# Patient Record
Sex: Female | Born: 1975 | Race: White | Hispanic: No | Smoking: Never smoker
Health system: Southern US, Community
[De-identification: ages and names within clinical notes are randomized; demographics above are authoritative.]

## PROBLEM LIST (undated history)

## (undated) ENCOUNTER — Ambulatory Visit: Admission: EM | Payer: 59

## (undated) DIAGNOSIS — E669 Obesity, unspecified: Secondary | ICD-10-CM

## (undated) DIAGNOSIS — R42 Dizziness and giddiness: Secondary | ICD-10-CM

## (undated) DIAGNOSIS — J40 Bronchitis, not specified as acute or chronic: Secondary | ICD-10-CM

## (undated) DIAGNOSIS — G4700 Insomnia, unspecified: Secondary | ICD-10-CM

## (undated) DIAGNOSIS — M545 Low back pain, unspecified: Secondary | ICD-10-CM

## (undated) DIAGNOSIS — L989 Disorder of the skin and subcutaneous tissue, unspecified: Secondary | ICD-10-CM

## (undated) DIAGNOSIS — Z634 Disappearance and death of family member: Secondary | ICD-10-CM

## (undated) DIAGNOSIS — R197 Diarrhea, unspecified: Secondary | ICD-10-CM

## (undated) DIAGNOSIS — K5712 Diverticulitis of small intestine without perforation or abscess without bleeding: Secondary | ICD-10-CM

## (undated) DIAGNOSIS — M25561 Pain in right knee: Secondary | ICD-10-CM

## (undated) DIAGNOSIS — J329 Chronic sinusitis, unspecified: Secondary | ICD-10-CM

## (undated) DIAGNOSIS — K5900 Constipation, unspecified: Secondary | ICD-10-CM

## (undated) DIAGNOSIS — K5792 Diverticulitis of intestine, part unspecified, without perforation or abscess without bleeding: Secondary | ICD-10-CM

## (undated) DIAGNOSIS — J0110 Acute frontal sinusitis, unspecified: Secondary | ICD-10-CM

## (undated) DIAGNOSIS — J189 Pneumonia, unspecified organism: Secondary | ICD-10-CM

## (undated) DIAGNOSIS — R51 Headache: Secondary | ICD-10-CM

## (undated) HISTORY — PX: MOUTH SURGERY: SHX715

## (undated) HISTORY — DX: Insomnia, unspecified: G47.00

## (undated) HISTORY — DX: Obesity, unspecified: E66.9

## (undated) HISTORY — DX: Constipation, unspecified: K59.00

## (undated) HISTORY — DX: Acute frontal sinusitis, unspecified: J01.10

## (undated) HISTORY — DX: Dizziness and giddiness: R42

## (undated) HISTORY — DX: Low back pain, unspecified: M54.50

## (undated) HISTORY — DX: Disappearance and death of family member: Z63.4

## (undated) HISTORY — DX: Diverticulitis of small intestine without perforation or abscess without bleeding: K57.12

## (undated) HISTORY — DX: Diarrhea, unspecified: R19.7

## (undated) HISTORY — DX: Bronchitis, not specified as acute or chronic: J40

## (undated) HISTORY — DX: Pain in right knee: M25.561

---
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
## 2006-08-05 ENCOUNTER — Emergency Department (HOSPITAL_COMMUNITY): Admission: EM | Admit: 2006-08-05 | Discharge: 2006-08-05 | Payer: Self-pay | Admitting: Emergency Medicine

## 2008-06-22 ENCOUNTER — Emergency Department (HOSPITAL_COMMUNITY): Admission: EM | Admit: 2008-06-22 | Discharge: 2008-06-22 | Payer: Self-pay | Admitting: Emergency Medicine

## 2012-05-15 ENCOUNTER — Emergency Department (HOSPITAL_COMMUNITY)
Admission: EM | Admit: 2012-05-15 | Discharge: 2012-05-15 | Disposition: A | Discharge status: Home / Self Care | Payer: BC Managed Care – PPO | Attending: Emergency Medicine | Admitting: Emergency Medicine

## 2012-05-15 ENCOUNTER — Other Ambulatory Visit (HOSPITAL_COMMUNITY): Payer: Self-pay | Admitting: *Deleted

## 2012-05-15 ENCOUNTER — Encounter (HOSPITAL_COMMUNITY): Payer: Self-pay | Admitting: Emergency Medicine

## 2012-05-15 ENCOUNTER — Ambulatory Visit (HOSPITAL_COMMUNITY): Admission: RE | Admit: 2012-05-15 | Payer: BC Managed Care – PPO | Source: Ambulatory Visit

## 2012-05-15 ENCOUNTER — Ambulatory Visit (HOSPITAL_COMMUNITY)
Admission: RE | Admit: 2012-05-15 | Discharge: 2012-05-15 | Disposition: A | Discharge status: Home / Self Care | Payer: BC Managed Care – PPO | Source: Ambulatory Visit | Attending: *Deleted | Admitting: *Deleted

## 2012-05-15 DIAGNOSIS — R109 Unspecified abdominal pain: Secondary | ICD-10-CM

## 2012-05-15 DIAGNOSIS — K5792 Diverticulitis of intestine, part unspecified, without perforation or abscess without bleeding: Secondary | ICD-10-CM

## 2012-05-15 DIAGNOSIS — K5732 Diverticulitis of large intestine without perforation or abscess without bleeding: Secondary | ICD-10-CM | POA: Insufficient documentation

## 2012-05-15 LAB — CBC WITH DIFFERENTIAL/PLATELET
Basophils Absolute: 0 10*3/uL (ref 0.0–0.1)
Basophils Relative: 0 % (ref 0–1)
Eosinophils Absolute: 0.2 10*3/uL (ref 0.0–0.7)
Eosinophils Relative: 1 % (ref 0–5)
HCT: 42.8 % (ref 36.0–46.0)
Hemoglobin: 14.8 g/dL (ref 12.0–15.0)
Lymphocytes Relative: 18 % (ref 12–46)
Lymphs Abs: 2.8 10*3/uL (ref 0.7–4.0)
MCH: 30.3 pg (ref 26.0–34.0)
MCHC: 34.6 g/dL (ref 30.0–36.0)
MCV: 87.5 fL (ref 78.0–100.0)
Monocytes Absolute: 1.6 10*3/uL — ABNORMAL HIGH (ref 0.1–1.0)
Monocytes Relative: 10 % (ref 3–12)
Neutro Abs: 10.8 10*3/uL — ABNORMAL HIGH (ref 1.7–7.7)
Neutrophils Relative %: 71 % (ref 43–77)
Platelets: 338 10*3/uL (ref 150–400)
RBC: 4.89 MIL/uL (ref 3.87–5.11)
RDW: 13 % (ref 11.5–15.5)
WBC: 15.3 10*3/uL — ABNORMAL HIGH (ref 4.0–10.5)

## 2012-05-15 LAB — BASIC METABOLIC PANEL
BUN: 9 mg/dL (ref 6–23)
CO2: 24 mEq/L (ref 19–32)
Calcium: 9.4 mg/dL (ref 8.4–10.5)
Chloride: 94 mEq/L — ABNORMAL LOW (ref 96–112)
Creatinine, Ser: 0.61 mg/dL (ref 0.50–1.10)
GFR calc Af Amer: >90 mL/min (ref >90)
GFR calc non Af Amer: >90 mL/min (ref >90)
Glucose, Bld: 86 mg/dL (ref 70–99)
Potassium: 3.5 mEq/L (ref 3.5–5.1)
Sodium: 134 mEq/L — ABNORMAL LOW (ref 135–145)

## 2012-05-15 MED ORDER — ONDANSETRON HCL 4 MG/2ML IJ SOLN
4.0000 mg | Freq: Once | INTRAMUSCULAR | Status: AC
  Administered 2012-05-15: 4 mg via INTRAVENOUS
  Filled 2012-05-15: qty 2

## 2012-05-15 MED ORDER — ONDANSETRON 8 MG PO TBDP: 8.0000 mg | ORAL_TABLET | Freq: Three times a day (TID) | ORAL | Status: DC | PRN

## 2012-05-15 MED ORDER — METRONIDAZOLE 500 MG PO TABS: 500.0000 mg | ORAL_TABLET | Freq: Four times a day (QID) | ORAL | Status: DC

## 2012-05-15 MED ORDER — OXYCODONE-ACETAMINOPHEN 5-325 MG PO TABS: 1.0000 | ORAL_TABLET | Freq: Four times a day (QID) | ORAL | Status: DC | PRN

## 2012-05-15 MED ORDER — SODIUM CHLORIDE 0.9 % IV BOLUS (SEPSIS)
500.0000 mL | Freq: Once | INTRAVENOUS | Status: AC
  Administered 2012-05-15: 500 mL via INTRAVENOUS

## 2012-05-15 MED ORDER — IOHEXOL 300 MG/ML  SOLN
100.0000 mL | Freq: Once | INTRAMUSCULAR | Status: AC | PRN
  Administered 2012-05-15: 100 mL via INTRAVENOUS

## 2012-05-15 MED ORDER — CIPROFLOXACIN IN D5W 400 MG/200ML IV SOLN
400.0000 mg | Freq: Once | INTRAVENOUS | Status: AC
  Administered 2012-05-15: 400 mg via INTRAVENOUS
  Filled 2012-05-15: qty 200

## 2012-05-15 MED ORDER — CIPROFLOXACIN HCL 750 MG PO TABS: 750.0000 mg | ORAL_TABLET | Freq: Two times a day (BID) | ORAL | Status: DC

## 2012-05-15 MED ORDER — METRONIDAZOLE IN NACL 5-0.79 MG/ML-% IV SOLN
500.0000 mg | Freq: Once | INTRAVENOUS | Status: AC
  Administered 2012-05-15: 500 mg via INTRAVENOUS
  Filled 2012-05-15: qty 100

## 2012-05-15 NOTE — ED Provider Notes (Signed)
History     CSN: [id number]  Arrival date & time 05/15/12  1907   First MD Initiated Contact with Patient 05/15/12 1931      Chief Complaint  Patient presents with  . Abdominal Pain    (Consider location/radiation/quality/duration/timing/severity/associated sxs/prior treatment) Patient is a 36 y.o. female presenting with abdominal pain. The history is provided by the patient.  Abdominal Pain The primary symptoms of the illness include abdominal pain, fever and nausea. The primary symptoms of the illness do not include shortness of breath, vomiting or diarrhea.  Symptoms associated with the illness do not include back pain.   patient presents with abdominal pain for the last few days. It began on Thursday. She took mag citrate. She went to [facility] urgent care and had an outpatient CT that showed diverticulitis with microperforation. Patient has had some fevers yesterday. She's had nauseousness without emesis but has been able to tolerate orals overall. She's had several stools after the magnesium citrate.  History reviewed. No pertinent past medical history.  Past Surgical History  Procedure Date  . Mouth surgery     No family history on file.  History  Substance Use Topics  . Smoking status: Never Smoker   . Smokeless tobacco: Never Used  . Alcohol Use: Yes     rarely    OB History    Grav Para Term Preterm Abortions TAB SAB Ect Mult Living                  Review of Systems  Constitutional: Positive for fever. Negative for activity change and appetite change.  HENT: Negative for neck stiffness.   Eyes: Negative for pain.  Respiratory: Negative for chest tightness and shortness of breath.   Cardiovascular: Negative for chest pain and leg swelling.  Gastrointestinal: Positive for nausea and abdominal pain. Negative for vomiting and diarrhea.  Genitourinary: Negative for flank pain.  Musculoskeletal: Negative for back pain.  Skin: Negative for rash.    Neurological: Negative for weakness, numbness and headaches.  Psychiatric/Behavioral: Negative for behavioral problems.    Allergies  Codeine and Penicillins  Home Medications   Current Outpatient Rx  Name Route Sig Dispense Refill  . HYDROCODONE-ACETAMINOPHEN 5-500 MG PO TABS Oral Take 1 tablet by mouth every 6 (six) hours as needed. pain    . CIPROFLOXACIN HCL 750 MG PO TABS Oral Take 1 tablet (750 mg total) by mouth 2 (two) times daily. 28 tablet 0  . METRONIDAZOLE 500 MG PO TABS Oral Take 1 tablet (500 mg total) by mouth 4 (four) times daily. 14 tablet 56  . ONDANSETRON 8 MG PO TBDP Oral Take 1 tablet (8 mg total) by mouth every 8 (eight) hours as needed for nausea. 20 tablet 0  . OXYCODONE-ACETAMINOPHEN 5-325 MG PO TABS Oral Take 1-2 tablets by mouth every 6 (six) hours as needed for pain. 20 tablet 0    BP 111/67  Pulse 95  Temp 98.4 F (36.9 C) (Oral)  Resp 18  SpO2 99%  LMP 05/07/2012  Physical Exam  Nursing note and vitals reviewed. Constitutional: She is oriented to person, place, and time. She appears well-developed and well-nourished.  HENT:  Head: Normocephalic and atraumatic.  Eyes: EOM are normal. Pupils are equal, round, and reactive to light.  Neck: Normal range of motion. Neck supple.  Cardiovascular: Normal rate, regular rhythm and normal heart sounds.   No murmur heard. Pulmonary/Chest: Effort normal and breath sounds normal. No respiratory distress. She has no wheezes. She  has no rales.  Abdominal: Soft. Bowel sounds are normal. She exhibits no distension. There is tenderness. There is no rebound and no guarding.       Left lower quadrant tenderness without rebound or guarding.  Musculoskeletal: Normal range of motion.  Neurological: She is alert and oriented to person, place, and time. No cranial nerve deficit.  Skin: Skin is warm and dry.  Psychiatric: She has a normal mood and affect. Her speech is normal.    ED Course  Procedures (including  critical care time)  Labs Reviewed  CBC WITH DIFFERENTIAL - Abnormal; Notable for the following:    WBC 15.3 (*)     Neutro Abs 10.8 (*)     Monocytes Absolute 1.6 (*)     All other components within normal limits  BASIC METABOLIC PANEL - Abnormal; Notable for the following:    Sodium 134 (*)     Chloride 94 (*)     All other components within normal limits  LAB REPORT - SCANNED   Ct Abdomen Pelvis W Contrast  05/15/2012  *RADIOLOGY REPORT*  Clinical Data: LLQ pain, leukocytosis, evaluate for diverticulitis  CT ABDOMEN AND PELVIS WITH CONTRAST  Technique:  Multidetector CT imaging of the abdomen and pelvis was performed following the standard protocol during bolus administration of intravenous contrast.  Contrast: OMNIPAQUE IOHEXOL 300 MG/ML  SOLN  Comparison: None.  Findings: Lung bases are clear.  Liver, spleen, pancreas, and adrenal glands are within normal limits.  Gallbladder is unremarkable.  No intrahepatic or extrahepatic ductal dilatation.  Kidneys are within normal limits.  No hydronephrosis.  No evidence of bowel obstruction.  Normal appendix.  Colonic diverticulosis with associated inflammatory changes involving the sigmoid colon, compatible with diverticulitis. Tiny focus of pericolonic gas is favored to reflect localized microperforation (coronal image 54).  No drainable fluid collection or abscess.  No free air.  Secondary inflammatory changes involving the adjacent left ovary are suspected (series 2/image 73).  Underlying 1.8 cm left ovarian cyst / follicle (series 2/image 74).  Uterus and right ovary are unremarkable.  No evidence of abdominal aortic aneurysm.  No abdominopelvic ascites.  Small retroperitoneal lymph nodes which do not meet pathologic CT size criteria.  Bladder is within normal limits.  Visualized osseous structures are within normal limits.  IMPRESSION: Sigmoid diverticulitis with suspected localized microperforation.  Suspected secondary inflammatory changes  involving the adjacent left ovary.  No drainable fluid collection or abscess.  No free air.   Original Report Authenticated By: Charline Bills, M.D.      1. Diverticulitis       MDM  Patient was sent in with a CT showing sigmoid diverticulitis with local microperforation. Patient feels good and has been tolerating orals. White count shows mildly elevation. Patient is given IV doses of Cipro and Flagyl. This point it is worth a trial at home. She'll followup in one to 2 days with her primary care Dr. Geronimo Running given instructions to return for worsening fevers abdominal pain or inability to tolerate orals.        Juliet Rude. Rubin Payor, MD 05/17/12 [id number]

## 2012-05-15 NOTE — ED Notes (Signed)
Writer told pt she needs a U/A, pt is aware of the urine sample.

## 2012-05-15 NOTE — ED Notes (Signed)
Pt presents from CT scan after having abdominal CT which shows "microperforation".  Pt developed abdominal pain on Thursday, took 1/2 bottle mag citrate on physician's advance, continued to have pain yesterday and completed mag citrate. Continues to have abd pain, nausea w/o emesis, multiple stools following mag. Pt alert, PWD, NAD

## 2012-05-15 NOTE — ED Notes (Signed)
[id number]<AV> Expected date:05/15/12<BR> Expected time: 7:04 PM<BR> Means of arrival:<BR> Comments:<BR> Abnormal Abd CT

## 2012-10-08 ENCOUNTER — Encounter (INDEPENDENT_AMBULATORY_CARE_PROVIDER_SITE_OTHER): Payer: Self-pay | Admitting: *Deleted

## 2012-10-13 ENCOUNTER — Other Ambulatory Visit (INDEPENDENT_AMBULATORY_CARE_PROVIDER_SITE_OTHER): Payer: Self-pay | Admitting: *Deleted

## 2012-10-13 ENCOUNTER — Ambulatory Visit (INDEPENDENT_AMBULATORY_CARE_PROVIDER_SITE_OTHER): Payer: Medicare HMO | Admitting: Internal Medicine

## 2012-10-13 ENCOUNTER — Encounter (INDEPENDENT_AMBULATORY_CARE_PROVIDER_SITE_OTHER): Payer: Self-pay | Admitting: Internal Medicine

## 2012-10-13 ENCOUNTER — Telehealth (INDEPENDENT_AMBULATORY_CARE_PROVIDER_SITE_OTHER): Payer: Self-pay | Admitting: *Deleted

## 2012-10-13 VITALS — BP 100/78 | HR 66 | Temp 98.5°F | Ht 66.0 in | Wt 266.3 lb

## 2012-10-13 DIAGNOSIS — K5732 Diverticulitis of large intestine without perforation or abscess without bleeding: Secondary | ICD-10-CM

## 2012-10-13 MED ORDER — PEG-KCL-NACL-NASULF-NA ASC-C 100 G PO SOLR: 1.0000 | Freq: Once | ORAL | Status: DC

## 2012-10-13 NOTE — Telephone Encounter (Signed)
Patient needs movi prep 

## 2012-10-13 NOTE — Progress Notes (Addendum)
Subjective:     Patient ID: Virginia Barrett, female   DOB: 1975-11-13, 37 y.o.   MRN: [id number]  HPI Referred to our office by Dr. Juanetta Barrett for  Recent diverticulitis. She was empirically treated with flagyl and bactrim. Pain started 3 weeks ago. Seen Dr. Juanetta Barrett and placed on Flagyl and Bactrim x 2 weeks. Finished antibiotics 1 week ago. She was having left lower quadrant pain and seemed to spread to center of lower abdomen.  She feels better. No abdominal pain. She has a BM once a day. No melena or bright red rectal bleeding. Appetite is good. Sometimes she has a bloating feeling. She is taking a probiotic daly.   04/2012: CT abdomen/pelvis with CM:IMPRESSION:  Sigmoid diverticulitis with suspected localized microperforation.  Suspected secondary inflammatory changes involving the adjacent  left ovary.  No drainable fluid collection or abscess. No free air.  CBC    Component Value Date/Time   WBC 15.3* 05/15/2012 1949   RBC 4.89 05/15/2012 1949   HGB 14.8 05/15/2012 1949   HCT 42.8 05/15/2012 1949   PLT 338 05/15/2012 1949   MCV 87.5 05/15/2012 1949   MCH 30.3 05/15/2012 1949   MCHC 34.6 05/15/2012 1949   RDW 13.0 05/15/2012 1949   LYMPHSABS 2.8 05/15/2012 1949   MONOABS 1.6* 05/15/2012 1949   EOSABS 0.2 05/15/2012 1949   BASOSABS 0.0 05/15/2012 1949      Review of Systems see hpi Current Outpatient Prescriptions  Medication Sig Dispense Refill  . BIOGAIA PROBIOTIC (BIOGAIA PROBIOTIC) LIQD Take by mouth daily at 8 pm.      . ciprofloxacin (CIPRO) 750 MG tablet Take 1 tablet (750 mg total) by mouth 2 (two) times daily.  28 tablet  0  . HYDROcodone-acetaminophen (VICODIN) 5-500 MG per tablet Take 1 tablet by mouth every 6 (six) hours as needed. pain      . meloxicam (MOBIC) 15 MG tablet Take 15 mg by mouth daily.      . metroNIDAZOLE (FLAGYL) 500 MG tablet Take 1 tablet (500 mg total) by mouth 4 (four) times daily.  14 tablet  56  . ondansetron (ZOFRAN-ODT) 8 MG  disintegrating tablet Take 1 tablet (8 mg total) by mouth every 8 (eight) hours as needed for nausea.  20 tablet  0  . tiZANidine (ZANAFLEX) 4 MG capsule Take 2 mg by mouth 3 (three) times daily.       No current facility-administered medications for this visit.   Ms. Past Surgical History  Procedure Laterality Date  . Mouth surgery    . Mouth surgery      wisdom teeth removed   Allergies  Allergen Reactions  . Ciprofloxacin     Rash  . Codeine Hives and Swelling  . Penicillins Hives       Objective:   Physical Exam  Filed Vitals:   10/13/12 1104  BP: 100/78  Pulse: 66  Temp: 98.5 F (36.9 C)  Height: 5\' 6"  (1.676 m)  Weight: 266 lb 4.8 oz (120.793 kg)   Alert and oriented. Skin warm and dry. Oral mucosa is moist.   . Sclera anicteric, conjunctivae is pink. Thyroid not enlarged. No cervical lymphadenopathy. Lungs clear. Heart regular rate and rhythm.  Abdomen is soft. Bowel sounds are positive. No hepatomegaly. No abdominal masses felt. No tenderness.  No edema to lower extremities.       Assessment:    Diverticulitis resolved.  Colonic neoplasm needs to be ruled out.  I discussed this case with Dr.  Rehman.     Plan:     Colonoscopy with Dr Virginia Barrett. All questions were answered.

## 2012-10-13 NOTE — Patient Instructions (Addendum)
Colonoscopy with Dr. Karilyn Cota. Any NSAIDs: Any Tattoos: The risks and benefits such as perforation, bleeding, and infection were reviewed with the patient and is agreeable.

## 2012-11-12 ENCOUNTER — Encounter (HOSPITAL_COMMUNITY): Payer: Self-pay | Admitting: Pharmacy Technician

## 2012-11-17 ENCOUNTER — Ambulatory Visit (HOSPITAL_COMMUNITY)
Admission: RE | Admit: 2012-11-17 | Discharge: 2012-11-17 | Disposition: A | Discharge status: Home / Self Care | Payer: Managed Care, Other (non HMO) | Source: Ambulatory Visit | Attending: Internal Medicine | Admitting: Internal Medicine

## 2012-11-17 ENCOUNTER — Encounter (HOSPITAL_COMMUNITY)
Admission: RE | Disposition: A | Discharge status: Home / Self Care | Payer: Self-pay | Source: Ambulatory Visit | Attending: Internal Medicine

## 2012-11-17 ENCOUNTER — Encounter (HOSPITAL_COMMUNITY): Payer: Self-pay | Admitting: *Deleted

## 2012-11-17 DIAGNOSIS — Z9109 Other allergy status, other than to drugs and biological substances: Secondary | ICD-10-CM | POA: Insufficient documentation

## 2012-11-17 DIAGNOSIS — Z8719 Personal history of other diseases of the digestive system: Secondary | ICD-10-CM

## 2012-11-17 DIAGNOSIS — K5732 Diverticulitis of large intestine without perforation or abscess without bleeding: Secondary | ICD-10-CM

## 2012-11-17 DIAGNOSIS — K5730 Diverticulosis of large intestine without perforation or abscess without bleeding: Secondary | ICD-10-CM

## 2012-11-17 DIAGNOSIS — D126 Benign neoplasm of colon, unspecified: Secondary | ICD-10-CM

## 2012-11-17 DIAGNOSIS — Z79899 Other long term (current) drug therapy: Secondary | ICD-10-CM | POA: Insufficient documentation

## 2012-11-17 DIAGNOSIS — Z880 Allergy status to penicillin: Secondary | ICD-10-CM | POA: Insufficient documentation

## 2012-11-17 DIAGNOSIS — Z83719 Family history of colon polyps, unspecified: Secondary | ICD-10-CM | POA: Insufficient documentation

## 2012-11-17 DIAGNOSIS — Z8371 Family history of colonic polyps: Secondary | ICD-10-CM | POA: Insufficient documentation

## 2012-11-17 DIAGNOSIS — Z883 Allergy status to other anti-infective agents status: Secondary | ICD-10-CM | POA: Insufficient documentation

## 2012-11-17 HISTORY — PX: COLONOSCOPY: SHX5424

## 2012-11-17 HISTORY — DX: Diverticulitis of intestine, part unspecified, without perforation or abscess without bleeding: K57.92

## 2012-11-17 SURGERY — COLONOSCOPY
Anesthesia: Moderate Sedation

## 2012-11-17 MED ORDER — MIDAZOLAM HCL 5 MG/5ML IJ SOLN
INTRAMUSCULAR | Status: DC | PRN
  Administered 2012-11-17 (×5): 2 mg via INTRAVENOUS

## 2012-11-17 MED ORDER — ONDANSETRON HCL 4 MG PO TABS: 4.0000 mg | ORAL_TABLET | Freq: Two times a day (BID) | ORAL | Status: DC | PRN

## 2012-11-17 MED ORDER — MEPERIDINE HCL 50 MG/ML IJ SOLN
INTRAMUSCULAR | Status: AC
  Filled 2012-11-17: qty 1

## 2012-11-17 MED ORDER — MEPERIDINE HCL 50 MG/ML IJ SOLN
INTRAMUSCULAR | Status: DC | PRN
  Administered 2012-11-17 (×2): 25 mg via INTRAVENOUS

## 2012-11-17 MED ORDER — MIDAZOLAM HCL 5 MG/5ML IJ SOLN
INTRAMUSCULAR | Status: AC
  Filled 2012-11-17: qty 10

## 2012-11-17 MED ORDER — SULFAMETHOXAZOLE-TMP DS 800-160 MG PO TABS: 1.0000 | ORAL_TABLET | Freq: Two times a day (BID) | ORAL | Status: DC

## 2012-11-17 MED ORDER — STERILE WATER FOR IRRIGATION IR SOLN
Status: DC | PRN
  Administered 2012-11-17: 10:00:00

## 2012-11-17 MED ORDER — SODIUM CHLORIDE 0.9 % IV SOLN
INTRAVENOUS | Status: DC
  Administered 2012-11-17: 10:00:00 via INTRAVENOUS

## 2012-11-17 NOTE — H&P (Signed)
Virginia Barrett is an 37 y.o. female.   Chief Complaint: Patient is here for colonoscopy. HPI: Patient is 37 year old Caucasian female who presents for diagnostic colonoscopy. She was treated for diverticulitis in October 2013 and she developed an episode recently responded to antibiotic therapy. She she denies abdominal pain diarrhea constipation or rectal bleeding. She has taken meloxicam sporadically but none in the last 6 months. Him history is negative for IBD or CRC.  Past Medical History  Diagnosis Date  . Diverticulitis     Past Surgical History  Procedure Laterality Date  . Mouth surgery      wisdom teeth removed    Family History  Problem Relation Age of Onset  . Colon polyps Mother   . Colon polyps Other   . Colon cancer Neg Hx    Social History:  reports that she has never smoked. She has never used smokeless tobacco. She reports that  drinks alcohol. She reports that she does not use illicit drugs.  Allergies:  Allergies  Allergen Reactions  . Ciprofloxacin     Rash  . Codeine Hives and Swelling  . Penicillins Hives  . Red Dye     Medications Prior to Admission  Medication Sig Dispense Refill  . bifidobacterium infantis (ALIGN) capsule Take 1 capsule by mouth daily.      [name] HYDROcodone-acetaminophen (VICODIN) 5-500 MG per tablet Take 1 tablet by mouth every 6 (six) hours as needed. pain      . peg 3350 powder (MOVIPREP) 100 G SOLR Take 1 kit (100 g total) by mouth once.  1 kit  0  . meloxicam (MOBIC) 15 MG tablet Take 15 mg by mouth daily as needed for pain.       [name] tiZANidine (ZANAFLEX) 4 MG tablet Take 2 mg by mouth daily as needed. Back flare-ups        No results found for this or any previous visit (from the past 48 hour(s)). No results found.  ROS  Blood pressure 123/76, pulse 109, temperature 98.1 F (36.7 C), temperature source Oral, resp. rate 25, height 5\' 6"  (1.676 m), weight 260 lb (117.935 kg), last menstrual period 11/05/2012, SpO2  100.00%. Physical Exam  Constitutional: She appears well-developed and well-nourished.  HENT:  Mouth/Throat: Oropharynx is clear and moist.  Eyes: Conjunctivae are normal. No scleral icterus.  Neck: No thyromegaly present.  Cardiovascular: Normal rate, regular rhythm and normal heart sounds.   No murmur heard. Respiratory: Effort normal and breath sounds normal.  GI: Soft. She exhibits no distension and no mass.  Musculoskeletal: She exhibits no edema.  Lymphadenopathy:    She has no cervical adenopathy.  Neurological: She is alert.  Skin: Skin is warm and dry.     Assessment/Plan History of sigmoid diverticulitis. Diagnostic colonoscopy.  REHMAN,NAJEEB U 11/17/2012, 10:27 AM

## 2012-11-17 NOTE — Op Note (Signed)
COLONOSCOPY PROCEDURE REPORT  PATIENT:  Virginia Barrett  MR#:  [id number] Birthdate:  [birth year], 37 y.o., female Endoscopist:  Dr. Malissa Hippo, MD Referred By:  Dr. Oneal Deputy. Juanetta Gosling, MD Procedure Date: 11/17/2012  Procedure:   Colonoscopy with snare polypectomy.  Indications:  Patient is 75 old Caucasian female who's been treated for diverticulitis on 2 occasions. She is now asymptomatic. She is undergoing diagnostic colonoscopy. Him history is negative for IBD or CRC.  Informed Consent:  The procedure and risks were reviewed with the patient and informed consent was obtained.  Medications:  Demerol 50 mg IV Versed 10 mg IV  Description of procedure:  After a digital rectal exam was performed, that colonoscope was advanced from the anus through the rectum and colon to the area of the cecum, ileocecal valve and appendiceal orifice. The cecum was deeply intubated. These structures were well-seen and photographed for the record. From the level of the cecum and ileocecal valve, the scope was slowly and cautiously withdrawn. The mucosal surfaces were carefully surveyed utilizing scope tip to flexion to facilitate fold flattening as needed. The scope was pulled down into the rectum where a thorough exam including retroflexion was performed. Terminal ileum was also examined.  Findings:   Prep excellent. Normal mucosa of terminal ileum. 7 mm polyp snared from ascending colon. It broke into 2 pieces during retrieval. Multiple diverticula at sigmoid colon with few more scattered proximally. One diverticulum at sigmoid colon with erosion and mucopurulent exudate. Normal rectal mucosa and anal rectal junction.    Therapeutic/Diagnostic Maneuvers Performed:  See above  Complications:  None  Cecal Withdrawal Time:  12 minutes  Impression:  Normal mucosa of terminal ileum. 7 mm polyp snared from ascending colon. Pancolonic diverticulosis. Most of the diverticula at at sigmoid colon and  one with changes of diverticulitis.  Recommendations:  Standard instructions given. Bactrim DS one by mouth twice a day for 2 weeks. Patient advised not to take meloxicam and use OTC Advil on when necessary basis. I will contact patient with biopsy results and further recommendations. Considering that she has single episode of complicated diverticulitis and she's had 2 more episodes(including today,s) she should consider elective sigmoid colectomy.  Tahjae Durr U  11/17/2012 11:11 AM  CC: Dr. Fredirick Maudlin, MD & Dr. Bonnetta Barry ref. provider found

## 2012-11-19 ENCOUNTER — Encounter (HOSPITAL_COMMUNITY): Payer: Self-pay | Admitting: Internal Medicine

## 2012-12-08 ENCOUNTER — Encounter (INDEPENDENT_AMBULATORY_CARE_PROVIDER_SITE_OTHER): Payer: Self-pay | Admitting: General Surgery

## 2012-12-08 ENCOUNTER — Ambulatory Visit (INDEPENDENT_AMBULATORY_CARE_PROVIDER_SITE_OTHER): Payer: Managed Care, Other (non HMO) | Admitting: General Surgery

## 2012-12-08 ENCOUNTER — Other Ambulatory Visit (INDEPENDENT_AMBULATORY_CARE_PROVIDER_SITE_OTHER): Payer: Self-pay | Admitting: General Surgery

## 2012-12-08 VITALS — BP 120/74 | HR 74 | Temp 97.0°F | Resp 16 | Ht 66.5 in | Wt 270.2 lb

## 2012-12-08 DIAGNOSIS — K5732 Diverticulitis of large intestine without perforation or abscess without bleeding: Secondary | ICD-10-CM

## 2012-12-08 NOTE — Progress Notes (Signed)
Patient ID: Virginia Barrett, female   DOB: 1976-03-12, 37 y.o.   MRN: [id number]  Chief Complaint  Patient presents with  . New Evaluation    eval diverticulitis    HPI Virginia Barrett is a 37 y.o. female.   HPI 37 yo WF referred by Dr Karilyn Cota to discuss colon surgery for recurrent diverticulitis. 37 year old otherwise healthy Caucasian female states she developed extreme left lower quadrant abdominal pain in October of 2013 on Wednesday afternoon. She initially thought that she was constipated. She felt that she was becoming dehydrated. She went to an urgent care clinic on Friday and was subsequently sent to [facility] where CT scan was performed which demonstrated sigmoid diverticulitis with a microperforation. They recommended admission to the hospital but the patient deferred admission and elected to be treated at home with oral antibiotics for one week. She states that her abdominal pain resolved. However in January she had a recurrent bout of left lower quadrant pain not as severe and was treated with another round of oral antibiotics as an outpatient by Dr. Juanetta Gosling. She was set up for a colonoscopy which she underwent in April. During the colonoscopy she reportedly was found to have some areas of active inflammation in her sigmoid colon and she was placed on antibiotics post procedure. She states that she has not had any abdominal pain of late. She reports daily bowel movements. She denies any melena or hematochezia. She denies any nausea or vomiting. She denies any family history of colon cancer. She denies any weight change. She denies any prior abdominal surgery. She states that she has had regular bowel movements her entire life. She denies any discharge from her vagina. She denies any dysuria or pneumaturia.  She works as a Social research officer, government at [employer]  Her mother has a history of bilateral PEs. Her mother who accompanies her today states that she had a full workup and there was  nothing found to explain her reasons for developing PEs such as a genetic disorder for thick blood Past Medical History  Diagnosis Date  . Diverticulitis     Past Surgical History  Procedure Laterality Date  . Mouth surgery      wisdom teeth removed  . Colonoscopy N/A 11/17/2012    Procedure: COLONOSCOPY;  Surgeon: Malissa Hippo, MD;  Location: [facility];  Service: Endoscopy;  Laterality: N/A;  1200-moved to1030 Ann to notify pt    Family History  Problem Relation Age of Onset  . Colon polyps Mother   . Diabetes Mother   . COPD Mother   . Colon polyps Other   . Colon cancer Neg Hx   . Hodgkin's lymphoma Father   . Cancer Maternal Aunt     breast  . Cancer Maternal Grandmother     breast    Social History History  Substance Use Topics  . Smoking status: Never Smoker   . Smokeless tobacco: Never Used  . Alcohol Use: Yes     Comment: rarely    Allergies  Allergen Reactions  . Cinnamon Anaphylaxis  . Sulfur Hives  . Ciprofloxacin     Rash  . Codeine Hives and Swelling  . Penicillins Hives  . Red Dye     Current Outpatient Prescriptions  Medication Sig Dispense Refill  . acetaminophen (TYLENOL) 500 MG tablet Take 500 mg by mouth every 6 (six) hours as needed for pain.      . bifidobacterium infantis (ALIGN) capsule Take 1 capsule  by mouth daily.       No current facility-administered medications for this visit.    Review of Systems Review of Systems  Constitutional: Negative for fever, chills and unexpected weight change.  HENT: Negative for hearing loss, congestion, sore throat, trouble swallowing and voice change.   Eyes: Negative for visual disturbance.  Respiratory: Negative for cough and wheezing.   Cardiovascular: Negative for chest pain, palpitations and leg swelling.  Gastrointestinal: Negative for nausea, vomiting, abdominal pain, diarrhea, constipation, blood in stool, abdominal distention and anal bleeding.  Genitourinary: Negative for  hematuria, vaginal bleeding and difficulty urinating.  Musculoskeletal: Negative for arthralgias.  Skin: Negative for rash and wound.       H/o abnormal moles  Neurological: Negative for seizures, syncope and headaches.  Hematological: Negative for adenopathy. Does not bruise/bleed easily.  Psychiatric/Behavioral: Negative for confusion.    Blood pressure 120/74, pulse 74, temperature 97 F (36.1 C), temperature source Temporal, resp. rate 16, height 5' 6.5" (1.689 m), weight 270 lb 3.2 oz (122.562 kg), SpO2 98.00%.  Physical Exam Physical Exam  Vitals reviewed. Constitutional: She is oriented to person, place, and time. She appears well-developed and well-nourished. No distress.  Obese, pear shaped  HENT:  Head: Normocephalic and atraumatic.  Right Ear: External ear normal.  Eyes: Conjunctivae are normal. No scleral icterus.  Neck: Neck supple. No tracheal deviation present. No thyromegaly present.  Cardiovascular: Normal rate, regular rhythm, normal heart sounds and intact distal pulses.   Pulmonary/Chest: Effort normal and breath sounds normal. No respiratory distress. She has no wheezes.  Abdominal: Soft. She exhibits no distension. There is no tenderness. There is no rebound and no guarding.  Old belly button ring scar  Musculoskeletal: She exhibits no edema and no tenderness.  Lymphadenopathy:    She has no cervical adenopathy.  Neurological: She is alert and oriented to person, place, and time.  Skin: Skin is warm and dry. No rash noted. She is not diaphoretic. No erythema. No pallor.  Psychiatric: She has a normal mood and affect. Her behavior is normal. Judgment and thought content normal.    Data Reviewed Dr Patty Sermons note and colonoscopy note 11/17/12 Description of procedure: After a digital rectal exam was performed, that colonoscope was advanced from the anus through the rectum and colon to the area of the cecum, ileocecal valve and appendiceal orifice. The cecum was  deeply intubated. These structures were well-seen and photographed for the record. From the level of the cecum and ileocecal valve, the scope was slowly and cautiously withdrawn. The mucosal surfaces were carefully surveyed utilizing scope tip to flexion to facilitate fold flattening as needed. The scope was pulled down into the rectum where a thorough exam including retroflexion was performed.  Terminal ileum was also examined.  Findings:  Prep excellent.  Normal mucosa of terminal ileum.  7 mm polyp snared from ascending colon. It broke into 2 pieces during retrieval. 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 One diverticulum at sigmoid colon with erosion and mucopurulent exudate.  Normal rectal mucosa and anal rectal junction.  CT report from 10/13 Findings: Lung bases are clear.  Liver, spleen, pancreas, and adrenal glands are within normal  limits.  Gallbladder is unremarkable. No intrahepatic or extrahepatic  ductal dilatation.  Kidneys are within normal limits. No hydronephrosis.  No evidence of bowel obstruction. Normal appendix. Colonic  diverticulosis with associated inflammatory changes involving the  sigmoid colon, compatible with diverticulitis. Tiny focus of  pericolonic gas is favored to reflect localized microperforation  (coronal image 54).  No drainable fluid collection or abscess. No free air.  Secondary inflammatory changes involving the adjacent left ovary  are suspected (series 2/image 73). Underlying 1.8 cm left ovarian  cyst / follicle (series 2/image 74). Uterus and right ovary are  unremarkable.  No evidence of abdominal aortic aneurysm.  No abdominopelvic ascites.  Small retroperitoneal lymph nodes which do not meet pathologic CT  size criteria.  Bladder is within normal limits.  Visualized osseous structures are within normal limits.  IMPRESSION:  Sigmoid diverticulitis with  suspected localized microperforation.  Suspected secondary inflammatory changes involving the adjacent  left ovary.  No drainable fluid collection or abscess. No free air.   Assessment    Complicated sigmoid diverticulitis Obesity     Plan    I had a very prolonged conversation with the patient and her mother. I explained that in my opinion she has had recurrent increased risk for additional episodes of diverticulitis. I explained that I could not give her certain odds or percentages about severe diverticulitis with frank perforation but she is certainly at risk for that given the fact that she has had at least one episode of complicated diverticulitis. We explained that generally with an episode of severe diverticulitis with perforation and generalized peritonitis that she would end up with a colectomy and end colostomy. We then discussed elective colon resection.  I discussed the procedure in detail.  The patient was given Agricultural engineer.  We discussed the risks and benefits of surgery including, but not limited to bleeding, infection (such as wound infection, abdominal abscess), injury to surrounding structures, blood clot formation, urinary retention, incisional hernia, anastomotic stricture, anastomotic leak, anesthesia risks, pulmonary & cardiac complications such as pneumonia &/or heart attack, need for additional procedures, ileus, & prolonged hospitalization.  We discussed the typical postoperative recovery course, including limitations & restrictions postoperatively. I explained that the likelihood of improvement in their symptoms is good.  I did explain that she was at higher risk for wound infection and hernia formation given her obesity.  We then discussed timing of surgery. She has decided that she would like to proceed with surgery. She does have a family vacation at the end of June beginning of July. I explained that I cannot guarantee that she may not have another episode  between now and then.  I recommended a repeat CT scan of her abdomen pelvis. I explained that I would call her with results and we can then decide on timing of her surgery. I explained that if I saw persistent inflammation then I would probably recommend to go ahead with surgery sooner rather than later. If there is no sign of active inflammation then we could probably delay til mid July. In the interim we discussed the importance of weight loss  All of her questions were asked and answered  Mary Sella. Andrey Campanile, MD, FACS General, Bariatric, & Minimally Invasive Surgery [facility], [place]         [facility] M 12/08/2012, 11:32 AM

## 2012-12-08 NOTE — Patient Instructions (Signed)
We will get the CT and I will call with the results and we will discuss timing of your surgery Call if you develop recurrent symptoms

## 2012-12-09 ENCOUNTER — Telehealth (INDEPENDENT_AMBULATORY_CARE_PROVIDER_SITE_OTHER): Payer: Self-pay | Admitting: General Surgery

## 2012-12-09 NOTE — Telephone Encounter (Signed)
Spoke with patient she is aware of ct at [facility] 09/19/12 at 930 she was given instruction on drinking contrast and eating

## 2012-12-09 NOTE — Telephone Encounter (Signed)
Left message with patient  She has appt for [facility] 12/15/12 at 9:30  She needs to drink  Contrast 730 and 830  Can eat  Food up until she starts to  Drink contrast

## 2012-12-15 ENCOUNTER — Ambulatory Visit (HOSPITAL_COMMUNITY): Payer: Managed Care, Other (non HMO)

## 2012-12-17 ENCOUNTER — Ambulatory Visit (HOSPITAL_COMMUNITY)
Admission: RE | Admit: 2012-12-17 | Discharge: 2012-12-17 | Disposition: A | Discharge status: Home / Self Care | Payer: Managed Care, Other (non HMO) | Source: Ambulatory Visit | Attending: General Surgery | Admitting: General Surgery

## 2012-12-17 DIAGNOSIS — K5730 Diverticulosis of large intestine without perforation or abscess without bleeding: Secondary | ICD-10-CM | POA: Insufficient documentation

## 2012-12-17 DIAGNOSIS — K5732 Diverticulitis of large intestine without perforation or abscess without bleeding: Secondary | ICD-10-CM

## 2012-12-17 MED ORDER — IOHEXOL 300 MG/ML  SOLN
100.0000 mL | Freq: Once | INTRAMUSCULAR | Status: AC | PRN
  Administered 2012-12-17: 100 mL via INTRAVENOUS

## 2012-12-21 ENCOUNTER — Telehealth (INDEPENDENT_AMBULATORY_CARE_PROVIDER_SITE_OTHER): Payer: Self-pay | Admitting: General Surgery

## 2012-12-21 NOTE — Telephone Encounter (Signed)
LM that i called to discuss results of CT scan which were normal - no active infection. Asked pt to call office when convenient to discuss timing of surgery  Virginia Barrett. Andrey Campanile, MD, FACS General, Bariatric, & Minimally Invasive Surgery [facility], [place]

## 2012-12-22 ENCOUNTER — Telehealth (INDEPENDENT_AMBULATORY_CARE_PROVIDER_SITE_OTHER): Payer: Self-pay | Admitting: *Deleted

## 2012-12-22 NOTE — Telephone Encounter (Signed)
Patient was called and a voice message was left asking that she call back and ask for me. I explained that I would like to see if I could get the questions that she may have for Dr.Rehman so that I may help in getting them addressed.

## 2012-12-22 NOTE — Telephone Encounter (Signed)
Patient spoke with Dr. Karilyn Cota about 2 weeks ago before apt with surgeon. She would like to speak with him about what the surgeon said and has a couple of question. Amoura said she was told by Dr. Karilyn Cota to give him a call after her apt if there were any questions. Her return phone number is [phone].

## 2012-12-23 ENCOUNTER — Telehealth (INDEPENDENT_AMBULATORY_CARE_PROVIDER_SITE_OTHER): Payer: Self-pay | Admitting: General Surgery

## 2012-12-23 ENCOUNTER — Telehealth (INDEPENDENT_AMBULATORY_CARE_PROVIDER_SITE_OTHER): Payer: Self-pay | Admitting: Internal Medicine

## 2012-12-23 MED ORDER — SULFAMETHOXAZOLE-TMP DS 800-160 MG PO TABS: 1.0000 | ORAL_TABLET | Freq: Two times a day (BID) | ORAL | Status: DC

## 2012-12-23 NOTE — Telephone Encounter (Signed)
LM for pt to call office to discuss CT results &/or any questions regarding our discussion from her appt

## 2012-12-23 NOTE — Telephone Encounter (Signed)
Pt returned call to Dr. Andrey Campanile, to discuss surgery planning and CT results.  She can be reached at:  [phone].

## 2012-12-23 NOTE — Telephone Encounter (Signed)
Dr.Rehman made aware of my attempt /message left for the patient .  Forwarded to Dr.Rehman as reminder

## 2012-12-23 NOTE — Telephone Encounter (Signed)
Patient developed left lower quadrant abdominal pain last night and now it is more intense. She's been sweaty and times feeling warm no nausea or vomiting. CT on 12/17/2012 by Dr. Gaynelle Adu was negative for diverticulitis. Patient symptoms are suggestive of recurrent diverticulitis. Will stop on Bactrim DS. Patient to call with progress report on Monday or earlier if symptoms worse. I believe she would be better off proceeding with elective sigmoid colectomy. She will plan this with Dr. Gaynelle Adu.

## 2012-12-23 NOTE — Telephone Encounter (Signed)
Dr. Rehman has talked with the patient. 

## 2012-12-24 ENCOUNTER — Telehealth (INDEPENDENT_AMBULATORY_CARE_PROVIDER_SITE_OTHER): Payer: Self-pay | Admitting: General Surgery

## 2012-12-24 ENCOUNTER — Telehealth (INDEPENDENT_AMBULATORY_CARE_PROVIDER_SITE_OTHER): Payer: Self-pay | Admitting: *Deleted

## 2012-12-24 MED ORDER — PEG-KCL-NACL-NASULF-NA ASC-C 100 G PO SOLR: 1.0000 | Freq: Once | ORAL | Status: DC

## 2012-12-24 NOTE — Telephone Encounter (Signed)
Opened in error

## 2012-12-24 NOTE — Telephone Encounter (Signed)
movi prep bowel prep mailed to patient

## 2012-12-24 NOTE — Addendum Note (Signed)
Addended by: Andrey Campanile Mekai Wilkinson M on: 12/24/2012 01:35 PM   Modules accepted: Orders

## 2012-12-24 NOTE — Telephone Encounter (Signed)
I called Virginia Barrett to see how she was feeling today. She states that she is feeling better today , she took antibiotic yesterday and then one today. In addition she took a pain medicine. States that she know that it is not an immediate fix but she can tell that the medications are working. Virginia Barrett ask that Dr.Rehman be made aware that she is very thankful for him calling , and filling the prescription for her 12/23/12. I advise Virginia Barrett if she had any problems/concerns to call our office.

## 2012-12-24 NOTE — Telephone Encounter (Signed)
Was able to reach patient and discuss the results of her CT scan from last week showed no evidence of active diverticulitis. The patient developed some mild recurrent left-sided abdominal pain Wednesday evening. She denies any fever or chills. She contacted Dr.Rehman's office and was placed on empiric antibiotic therapy. She was instructed to followup at his office on Monday if the pain was persistent. She states that she is doing better today. She still has some mild discomfort but it is not as bad as it was on Wednesday. The patient would like to proceed with sigmoid colectomy in early July. I think this is completely reasonable since the patient has had an episode of complicated diverticulitis as well as several small episodes of minor diverticulitis. I discussed with her the importance of taking a soft diet for the next few days. We rediscussed what she should look for with respect to worsening diverticulitis. We did discuss the possibility of her having a severe flare prior to July. I told her I office will contact her later today or Monday to schedule her for an elective sigmoid colectomy. She was instructed that she will need to do a bowel prep the day before surgery and will give her details regarding that later. She was instructed to call with any additional questions or concerns

## 2012-12-24 NOTE — Telephone Encounter (Signed)
Thanks for checking on Virginia Barrett. Glad she is feeling better

## 2012-12-24 NOTE — Addendum Note (Signed)
Addended by: Andrey Campanile, Syncere Kaminski M on: 12/24/2012 01:27 PM   Modules accepted: Orders

## 2012-12-26 NOTE — Telephone Encounter (Signed)
Appreciate Dr. Tawana Scale help

## 2013-01-31 ENCOUNTER — Telehealth (INDEPENDENT_AMBULATORY_CARE_PROVIDER_SITE_OTHER): Payer: Self-pay | Admitting: General Surgery

## 2013-01-31 ENCOUNTER — Encounter (HOSPITAL_COMMUNITY): Payer: Self-pay | Admitting: Pharmacy Technician

## 2013-01-31 NOTE — Telephone Encounter (Signed)
Patient made aware that we can proceed with surgery. Patient has bowel prep.

## 2013-01-31 NOTE — Patient Instructions (Signed)
[name]  01/31/2013   Your procedure is scheduled on:  02/03/2013   Report to [facility] at    0830  AM.  Call this number if you have problems the morning of surgery: [phone]   Remember:   Do not eat food or drink liquids after midnight.   Take these medicines the morning of surgery with A SIP OF WATER:    Do not wear jewelry, make-up or nail polish.  Do not wear lotions, powders, or perfumes. .  Do not shave 48 hours prior to surgery.   Do not bring valuables to the hospital.  Contacts, dentures or bridgework may not be worn into surgery.  Leave suitcase in the car. After surgery it may be brought to your room.  For patients admitted to the hospital, checkout time is 11:00 AM the day of  discharge.       SEE CHG INSTRUCTION SHEET    Please read over the following fact sheets that you were given: , coughing and deep breathing exercises, leg exercises               Failure to comply with these instructions may result in cancellation of your surgery.                Patient Signature ____________________________              Nurse Signature _____________________________

## 2013-01-31 NOTE — Telephone Encounter (Signed)
Patient calling - she is scheduled for a sigmoid colectomy on Thursday 02/03/2013. She developed a sinus infection last week. She has been on 5 days of azithromycin which she will finish on Wednesday. She is currently not having any fevers, but does still have a cough. She also had a steroid shot on Saturday 01/29/2013 for this. Please advise if okay to proceed with surgery. [phone]. When you call back ask for Marcelino Duster (this is what patient goes by).

## 2013-01-31 NOTE — Telephone Encounter (Signed)
Ok to proceed with surgery as planned. pls make sure she has a bowel prep

## 2013-02-01 ENCOUNTER — Encounter (HOSPITAL_COMMUNITY)
Admission: RE | Admit: 2013-02-01 | Discharge: 2013-02-01 | Disposition: A | Discharge status: Home / Self Care | Payer: Managed Care, Other (non HMO) | Source: Ambulatory Visit | Attending: General Surgery | Admitting: General Surgery

## 2013-02-01 ENCOUNTER — Ambulatory Visit (HOSPITAL_COMMUNITY)
Admission: RE | Admit: 2013-02-01 | Discharge: 2013-02-01 | Disposition: A | Discharge status: Home / Self Care | Payer: Managed Care, Other (non HMO) | Source: Ambulatory Visit | Attending: General Surgery | Admitting: General Surgery

## 2013-02-01 ENCOUNTER — Encounter (HOSPITAL_COMMUNITY): Payer: Self-pay

## 2013-02-01 DIAGNOSIS — K5732 Diverticulitis of large intestine without perforation or abscess without bleeding: Secondary | ICD-10-CM | POA: Insufficient documentation

## 2013-02-01 DIAGNOSIS — Z01818 Encounter for other preprocedural examination: Secondary | ICD-10-CM | POA: Insufficient documentation

## 2013-02-01 HISTORY — DX: Disorder of the skin and subcutaneous tissue, unspecified: L98.9

## 2013-02-01 HISTORY — DX: Chronic sinusitis, unspecified: J32.9

## 2013-02-01 HISTORY — DX: Pneumonia, unspecified organism: J18.9

## 2013-02-01 HISTORY — DX: Headache: R51

## 2013-02-01 LAB — CBC WITH DIFFERENTIAL/PLATELET
Basophils Relative: 0 % (ref 0–1)
Hemoglobin: 13.7 g/dL (ref 12.0–15.0)
Lymphocytes Relative: 22 % (ref 12–46)
Lymphs Abs: 2.3 10*3/uL (ref 0.7–4.0)
MCHC: 33 g/dL (ref 30.0–36.0)
Monocytes Relative: 11 % (ref 3–12)
Neutro Abs: 6.5 10*3/uL (ref 1.7–7.7)
Neutrophils Relative %: 61 % (ref 43–77)
RBC: 4.61 MIL/uL (ref 3.87–5.11)
WBC: 10.6 10*3/uL — ABNORMAL HIGH (ref 4.0–10.5)

## 2013-02-01 LAB — COMPREHENSIVE METABOLIC PANEL
Albumin: 3.8 g/dL (ref 3.5–5.2)
Alkaline Phosphatase: 55 U/L (ref 39–117)
BUN: 9 mg/dL (ref 6–23)
CO2: 27 mEq/L (ref 19–32)
Chloride: 101 mEq/L (ref 96–112)
Potassium: 4.3 mEq/L (ref 3.5–5.1)
Total Bilirubin: 0.2 mg/dL — ABNORMAL LOW (ref 0.3–1.2)

## 2013-02-01 LAB — ABO/RH: ABO/RH(D): A NEG

## 2013-02-02 ENCOUNTER — Telehealth (INDEPENDENT_AMBULATORY_CARE_PROVIDER_SITE_OTHER): Payer: Self-pay | Admitting: General Surgery

## 2013-02-02 MED ORDER — GENTAMICIN SULFATE 40 MG/ML IJ SOLN
420.0000 mg | INTRAVENOUS | Status: AC
  Administered 2013-02-03: 420 mg via INTRAVENOUS
  Filled 2013-02-02: qty 10.5

## 2013-02-02 MED ORDER — CLINDAMYCIN PHOSPHATE 900 MG/50ML IV SOLN
900.0000 mg | INTRAVENOUS | Status: AC
  Administered 2013-02-03: 900 mg via INTRAVENOUS

## 2013-02-02 NOTE — Progress Notes (Signed)
On 02/01/13 request last office visit note from [facility] by fax.

## 2013-02-02 NOTE — Telephone Encounter (Signed)
Called patient to see if she had any last minute questions regarding surgery tomorrow. She states that she is feeling much better. She denies any fevers or chills. Her cold is resolved. She still has a somewhat scratchy voice. She denies any abdominal pain. All of her questions were asked and answered. She finished up her antibiotics for her sinus infection today.

## 2013-02-03 ENCOUNTER — Ambulatory Visit (HOSPITAL_COMMUNITY): Payer: Managed Care, Other (non HMO) | Admitting: Anesthesiology

## 2013-02-03 ENCOUNTER — Inpatient Hospital Stay (HOSPITAL_COMMUNITY)
Admission: RE | Admit: 2013-02-03 | Discharge: 2013-02-09 | DRG: 330 | Disposition: A | Discharge status: Home / Self Care | Payer: Managed Care, Other (non HMO) | Source: Ambulatory Visit | Attending: General Surgery | Admitting: General Surgery

## 2013-02-03 ENCOUNTER — Encounter (HOSPITAL_COMMUNITY)
Admission: RE | Disposition: A | Discharge status: Home / Self Care | Payer: Self-pay | Source: Ambulatory Visit | Attending: General Surgery

## 2013-02-03 ENCOUNTER — Encounter (HOSPITAL_COMMUNITY): Payer: Self-pay

## 2013-02-03 ENCOUNTER — Encounter (HOSPITAL_COMMUNITY): Payer: Self-pay | Admitting: Anesthesiology

## 2013-02-03 DIAGNOSIS — K5730 Diverticulosis of large intestine without perforation or abscess without bleeding: Principal | ICD-10-CM | POA: Diagnosis present

## 2013-02-03 DIAGNOSIS — E669 Obesity, unspecified: Secondary | ICD-10-CM | POA: Diagnosis present

## 2013-02-03 DIAGNOSIS — K5732 Diverticulitis of large intestine without perforation or abscess without bleeding: Secondary | ICD-10-CM

## 2013-02-03 DIAGNOSIS — Q438 Other specified congenital malformations of intestine: Secondary | ICD-10-CM

## 2013-02-03 DIAGNOSIS — Z6841 Body Mass Index (BMI) 40.0 and over, adult: Secondary | ICD-10-CM

## 2013-02-03 DIAGNOSIS — Z881 Allergy status to other antibiotic agents status: Secondary | ICD-10-CM

## 2013-02-03 DIAGNOSIS — Z79899 Other long term (current) drug therapy: Secondary | ICD-10-CM

## 2013-02-03 HISTORY — PX: PROCTOSCOPY: SHX2266

## 2013-02-03 HISTORY — PX: LAPAROSCOPIC SIGMOID COLECTOMY: SHX5928

## 2013-02-03 LAB — TYPE AND SCREEN: ABO/RH(D): A NEG

## 2013-02-03 SURGERY — COLECTOMY, SIGMOID, LAPAROSCOPIC
Anesthesia: General | Site: Rectum | Wound class: Clean Contaminated

## 2013-02-03 MED ORDER — PROMETHAZINE HCL 25 MG/ML IJ SOLN: 12.5000 mg | Freq: Four times a day (QID) | INTRAMUSCULAR | Status: DC | PRN

## 2013-02-03 MED ORDER — HYDROMORPHONE HCL PF 1 MG/ML IJ SOLN
INTRAMUSCULAR | Status: AC
  Filled 2013-02-03: qty 1

## 2013-02-03 MED ORDER — GENTAMICIN SULFATE 40 MG/ML IJ SOLN
420.0000 mg | INTRAVENOUS | Status: DC
  Filled 2013-02-03: qty 10.5

## 2013-02-03 MED ORDER — LACTATED RINGERS IR SOLN
Status: DC | PRN
  Administered 2013-02-03: 3000 mL

## 2013-02-03 MED ORDER — NEOSTIGMINE METHYLSULFATE 1 MG/ML IJ SOLN
INTRAMUSCULAR | Status: DC | PRN
  Administered 2013-02-03: 5 mg via INTRAVENOUS

## 2013-02-03 MED ORDER — NALOXONE HCL 0.4 MG/ML IJ SOLN: 0.4000 mg | INTRAMUSCULAR | Status: DC | PRN

## 2013-02-03 MED ORDER — CISATRACURIUM BESYLATE (PF) 10 MG/5ML IV SOLN
INTRAVENOUS | Status: DC | PRN
  Administered 2013-02-03: 12 mg via INTRAVENOUS
  Administered 2013-02-03 (×2): 4 mg via INTRAVENOUS
  Administered 2013-02-03: 2 mg via INTRAVENOUS

## 2013-02-03 MED ORDER — DIPHENHYDRAMINE HCL 12.5 MG/5ML PO ELIX: 12.5000 mg | ORAL_SOLUTION | Freq: Four times a day (QID) | ORAL | Status: DC | PRN

## 2013-02-03 MED ORDER — CLINDAMYCIN PHOSPHATE 900 MG/50ML IV SOLN: 900.0000 mg | INTRAVENOUS | Status: DC

## 2013-02-03 MED ORDER — ONDANSETRON HCL 4 MG/2ML IJ SOLN
INTRAMUSCULAR | Status: DC | PRN
  Administered 2013-02-03: 4 mg via INTRAVENOUS

## 2013-02-03 MED ORDER — HYDROMORPHONE HCL PF 1 MG/ML IJ SOLN
0.2500 mg | INTRAMUSCULAR | Status: DC | PRN
  Administered 2013-02-03 (×4): 0.5 mg via INTRAVENOUS

## 2013-02-03 MED ORDER — LACTATED RINGERS IV SOLN
INTRAVENOUS | Status: DC
  Administered 2013-02-03: 1000 mL via INTRAVENOUS
  Administered 2013-02-03 (×2): via INTRAVENOUS

## 2013-02-03 MED ORDER — ALVIMOPAN 12 MG PO CAPS
12.0000 mg | ORAL_CAPSULE | Freq: Once | ORAL | Status: AC
  Administered 2013-02-03: 12 mg via ORAL
  Filled 2013-02-03: qty 1

## 2013-02-03 MED ORDER — CLINDAMYCIN PHOSPHATE 900 MG/50ML IV SOLN
INTRAVENOUS | Status: AC
  Filled 2013-02-03: qty 50

## 2013-02-03 MED ORDER — BUPIVACAINE-EPINEPHRINE 0.25% -1:200000 IJ SOLN
INTRAMUSCULAR | Status: DC | PRN
  Administered 2013-02-03: 30 mL

## 2013-02-03 MED ORDER — DIPHENHYDRAMINE HCL 50 MG/ML IJ SOLN: 12.5000 mg | Freq: Four times a day (QID) | INTRAMUSCULAR | Status: DC | PRN

## 2013-02-03 MED ORDER — GLYCOPYRROLATE 0.2 MG/ML IJ SOLN
INTRAMUSCULAR | Status: DC | PRN
  Administered 2013-02-03: .7 mg via INTRAVENOUS

## 2013-02-03 MED ORDER — PROPOFOL 10 MG/ML IV BOLUS
INTRAVENOUS | Status: DC | PRN
  Administered 2013-02-03: 200 mg via INTRAVENOUS

## 2013-02-03 MED ORDER — HEPARIN SODIUM (PORCINE) 5000 UNIT/ML IJ SOLN
5000.0000 [IU] | Freq: Once | INTRAMUSCULAR | Status: AC
  Administered 2013-02-03: 5000 [IU] via SUBCUTANEOUS
  Filled 2013-02-03: qty 1

## 2013-02-03 MED ORDER — 0.9 % SODIUM CHLORIDE (POUR BTL) OPTIME
TOPICAL | Status: DC | PRN
  Administered 2013-02-03: 1000 mL

## 2013-02-03 MED ORDER — ENOXAPARIN SODIUM 40 MG/0.4ML ~~LOC~~ SOLN
40.0000 mg | SUBCUTANEOUS | Status: DC
  Administered 2013-02-04 – 2013-02-09 (×6): 40 mg via SUBCUTANEOUS
  Filled 2013-02-03 (×8): qty 0.4

## 2013-02-03 MED ORDER — ALVIMOPAN 12 MG PO CAPS
12.0000 mg | ORAL_CAPSULE | Freq: Two times a day (BID) | ORAL | Status: DC
  Administered 2013-02-04 – 2013-02-09 (×10): 12 mg via ORAL
  Filled 2013-02-03 (×12): qty 1

## 2013-02-03 MED ORDER — SUFENTANIL CITRATE 50 MCG/ML IV SOLN
INTRAVENOUS | Status: DC | PRN
  Administered 2013-02-03: 20 ug via INTRAVENOUS
  Administered 2013-02-03 (×5): 10 ug via INTRAVENOUS
  Administered 2013-02-03: 5 ug via INTRAVENOUS
  Administered 2013-02-03: 10 ug via INTRAVENOUS

## 2013-02-03 MED ORDER — KCL IN DEXTROSE-NACL 20-5-0.45 MEQ/L-%-% IV SOLN
INTRAVENOUS | Status: DC
  Administered 2013-02-03: 17:00:00 via INTRAVENOUS
  Administered 2013-02-04: 125 mL/h via INTRAVENOUS
  Administered 2013-02-04 – 2013-02-08 (×10): via INTRAVENOUS
  Filled 2013-02-03 (×18): qty 1000

## 2013-02-03 MED ORDER — BIOTENE DRY MOUTH MT LIQD
15.0000 mL | Freq: Two times a day (BID) | OROMUCOSAL | Status: DC
  Administered 2013-02-03 – 2013-02-09 (×12): 15 mL via OROMUCOSAL

## 2013-02-03 MED ORDER — MIDAZOLAM HCL 5 MG/5ML IJ SOLN
INTRAMUSCULAR | Status: DC | PRN
  Administered 2013-02-03: 2 mg via INTRAVENOUS

## 2013-02-03 MED ORDER — PROMETHAZINE HCL 25 MG/ML IJ SOLN
6.2500 mg | INTRAMUSCULAR | Status: DC | PRN
  Administered 2013-02-03: 6.25 mg via INTRAVENOUS

## 2013-02-03 MED ORDER — BUPIVACAINE-EPINEPHRINE PF 0.25-1:200000 % IJ SOLN
INTRAMUSCULAR | Status: AC
  Filled 2013-02-03: qty 30

## 2013-02-03 MED ORDER — FLUTICASONE PROPIONATE 50 MCG/ACT NA SUSP
2.0000 | Freq: Every day | NASAL | Status: DC
  Administered 2013-02-03 – 2013-02-08 (×6): 2 via NASAL
  Filled 2013-02-03: qty 16

## 2013-02-03 MED ORDER — STERILE WATER FOR IRRIGATION IR SOLN
Status: DC | PRN
  Administered 2013-02-03 (×2): 1500 mL

## 2013-02-03 MED ORDER — LIDOCAINE HCL 4 % MT SOLN
OROMUCOSAL | Status: DC | PRN
  Administered 2013-02-03: 4 mL via TOPICAL

## 2013-02-03 MED ORDER — FENTANYL 10 MCG/ML IV SOLN
INTRAVENOUS | Status: DC
  Administered 2013-02-03: 15:00:00 via INTRAVENOUS
  Administered 2013-02-03: 260 ug via INTRAVENOUS
  Administered 2013-02-03: 30 ug via INTRAVENOUS
  Administered 2013-02-04: 22:00:00 via INTRAVENOUS
  Administered 2013-02-04: 100 ug via INTRAVENOUS
  Administered 2013-02-04: via INTRAVENOUS
  Administered 2013-02-04: 80 ug via INTRAVENOUS
  Administered 2013-02-04: 208 ug via INTRAVENOUS
  Administered 2013-02-04: 220 ug via INTRAVENOUS
  Administered 2013-02-04: 273.4 ug via INTRAVENOUS
  Administered 2013-02-04: 180 ug via INTRAVENOUS
  Administered 2013-02-04: 14:00:00 via INTRAVENOUS
  Administered 2013-02-05: 150 ug via INTRAVENOUS
  Administered 2013-02-05: 50 ug via INTRAVENOUS
  Administered 2013-02-05: 14:00:00 via INTRAVENOUS
  Administered 2013-02-05: 220 ug/h via INTRAVENOUS
  Administered 2013-02-05: 150 ug via INTRAVENOUS
  Administered 2013-02-05: 80 ug via INTRAVENOUS
  Administered 2013-02-06: 120 ug/h via INTRAVENOUS
  Administered 2013-02-06: 210 ug via INTRAVENOUS
  Administered 2013-02-06: 170 ug via INTRAVENOUS
  Administered 2013-02-06: 100 ug via INTRAVENOUS
  Administered 2013-02-06: 40 ug/h via INTRAVENOUS
  Administered 2013-02-06: 190 ug via INTRAVENOUS
  Administered 2013-02-07: 210 ug via INTRAVENOUS
  Administered 2013-02-07: 98.69 ug via INTRAVENOUS
  Administered 2013-02-07: 280 ug via INTRAVENOUS
  Administered 2013-02-07: 120 ug via INTRAVENOUS
  Filled 2013-02-03 (×8): qty 50

## 2013-02-03 MED ORDER — MENTHOL 3 MG MT LOZG
1.0000 | LOZENGE | OROMUCOSAL | Status: DC | PRN
  Filled 2013-02-03: qty 9

## 2013-02-03 MED ORDER — ONDANSETRON HCL 4 MG/2ML IJ SOLN: 4.0000 mg | Freq: Four times a day (QID) | INTRAMUSCULAR | Status: DC | PRN

## 2013-02-03 MED ORDER — LACTATED RINGERS IV SOLN: INTRAVENOUS | Status: DC

## 2013-02-03 MED ORDER — SUCCINYLCHOLINE CHLORIDE 20 MG/ML IJ SOLN
INTRAMUSCULAR | Status: DC | PRN
  Administered 2013-02-03: 100 mg via INTRAVENOUS

## 2013-02-03 MED ORDER — DEXAMETHASONE SODIUM PHOSPHATE 10 MG/ML IJ SOLN
INTRAMUSCULAR | Status: DC | PRN
  Administered 2013-02-03: 10 mg via INTRAVENOUS

## 2013-02-03 MED ORDER — PROMETHAZINE HCL 25 MG/ML IJ SOLN
INTRAMUSCULAR | Status: AC
  Filled 2013-02-03: qty 1

## 2013-02-03 MED ORDER — PANTOPRAZOLE SODIUM 40 MG IV SOLR
40.0000 mg | INTRAVENOUS | Status: DC
  Administered 2013-02-03 – 2013-02-08 (×6): 40 mg via INTRAVENOUS
  Filled 2013-02-03 (×7): qty 40

## 2013-02-03 MED ORDER — SODIUM CHLORIDE 0.9 % IJ SOLN: 9.0000 mL | INTRAMUSCULAR | Status: DC | PRN

## 2013-02-03 SURGICAL SUPPLY — 99 items
APPLICATOR COTTON TIP 6IN STRL (MISCELLANEOUS) IMPLANT
APPLIER CLIP 5 13 M/L LIGAMAX5 (MISCELLANEOUS)
APPLIER CLIP ROT 10 11.4 M/L (STAPLE)
APR CLP MED LRG 11.4X10 (STAPLE)
APR CLP MED LRG 5 ANG JAW (MISCELLANEOUS)
BLADE EXTENDED COATED 6.5IN (ELECTRODE) IMPLANT
BLADE HEX COATED 2.75 (ELECTRODE) ×3 IMPLANT
BLADE SURG SZ10 CARB STEEL (BLADE) ×6 IMPLANT
BRR ADH 5X3 SEPRAFILM 6 SHT (MISCELLANEOUS)
BRR ADH 6X5 SEPRAFILM 1 SHT (MISCELLANEOUS)
CANISTER SUCTION 2500CC (MISCELLANEOUS) ×3 IMPLANT
CANNULA ENDOPATH XCEL 11M (ENDOMECHANICALS) IMPLANT
CELLS DAT CNTRL 66122 CELL SVR (MISCELLANEOUS) IMPLANT
CHLORAPREP W/TINT 26ML (MISCELLANEOUS) ×3 IMPLANT
CLIP APPLIE 5 13 M/L LIGAMAX5 (MISCELLANEOUS) IMPLANT
CLIP APPLIE ROT 10 11.4 M/L (STAPLE) IMPLANT
CLIP TI LARGE 6 (CLIP) IMPLANT
CLOTH BEACON ORANGE TIMEOUT ST (SAFETY) ×3 IMPLANT
COVER MAYO STAND STRL (DRAPES) ×3 IMPLANT
COVER SURGICAL LIGHT HANDLE (MISCELLANEOUS) ×3 IMPLANT
CUTTER FLEX LINEAR 45M (STAPLE) IMPLANT
DISSECTOR ROUND CHERRY 3/8 STR (MISCELLANEOUS) IMPLANT
DRAPE BACK TABLE (DRAPES) IMPLANT
DRAPE LAPAROSCOPIC ABDOMINAL (DRAPES) ×3 IMPLANT
DRAPE LG THREE QUARTER DISP (DRAPES) IMPLANT
DRAPE UTILITY XL STRL (DRAPES) ×3 IMPLANT
DRAPE WARM FLUID 44X44 (DRAPE) ×3 IMPLANT
DRESSING TELFA 8X3 (GAUZE/BANDAGES/DRESSINGS) IMPLANT
DRSG OPSITE POSTOP 4X6 (GAUZE/BANDAGES/DRESSINGS) ×1 IMPLANT
ELECT REM PT RETURN 9FT ADLT (ELECTROSURGICAL) ×3
ELECTRODE REM PT RTRN 9FT ADLT (ELECTROSURGICAL) ×2 IMPLANT
GAUZE SPONGE 4X4 16PLY XRAY LF (GAUZE/BANDAGES/DRESSINGS) IMPLANT
GLOVE BIO SURGEON STRL SZ7.5 (GLOVE) ×6 IMPLANT
GLOVE BIOGEL M STRL SZ7.5 (GLOVE) IMPLANT
GLOVE BIOGEL PI IND STRL 7.0 (GLOVE) ×2 IMPLANT
GLOVE BIOGEL PI IND STRL 8 (GLOVE) ×2 IMPLANT
GLOVE BIOGEL PI INDICATOR 7.0 (GLOVE) ×1
GLOVE BIOGEL PI INDICATOR 8 (GLOVE) ×1
GLOVE INDICATOR 8.0 STRL GRN (GLOVE) ×3 IMPLANT
GOWN STRL NON-REIN LRG LVL3 (GOWN DISPOSABLE) ×3 IMPLANT
GOWN STRL REIN XL XLG (GOWN DISPOSABLE) ×6 IMPLANT
HEMOSTAT SURGICEL 4X8 (HEMOSTASIS) IMPLANT
KIT BASIN OR (CUSTOM PROCEDURE TRAY) ×3 IMPLANT
LEGGING LITHOTOMY PAIR STRL (DRAPES) IMPLANT
LIGASURE IMPACT 36 18CM CVD LR (INSTRUMENTS) IMPLANT
LOOP MINI RED (MISCELLANEOUS) IMPLANT
LOOP VESSEL MAXI BLUE (MISCELLANEOUS) IMPLANT
NDL INSUFFLATION 14GA 120MM (NEEDLE) IMPLANT
NEEDLE INSUFFLATION 14GA 120MM (NEEDLE) IMPLANT
NS IRRIG 1000ML POUR BTL (IV SOLUTION) ×6 IMPLANT
PENCIL BUTTON HOLSTER BLD 10FT (ELECTRODE) ×3 IMPLANT
RELOAD 45 VASCULAR/THIN (ENDOMECHANICALS) IMPLANT
RELOAD BLUE (STAPLE) ×2 IMPLANT
RELOAD STAPLE 45 2.5 WHT GRN (ENDOMECHANICALS) IMPLANT
RETRACTOR WND ALEXIS 18 MED (MISCELLANEOUS) IMPLANT
RTRCTR WOUND ALEXIS 18CM MED (MISCELLANEOUS)
SCALPEL HARMONIC ACE (MISCELLANEOUS) IMPLANT
SEPRAFILM MEMBRANE 5X6 (MISCELLANEOUS) IMPLANT
SEPRAFILM PROCEDURAL PACK 3X5 (MISCELLANEOUS) IMPLANT
SET IRRIG TUBING LAPAROSCOPIC (IRRIGATION / IRRIGATOR) ×3 IMPLANT
SHEARS FOC LG CVD HARMONIC 17C (MISCELLANEOUS) IMPLANT
SLEEVE XCEL OPT CAN 5 100 (ENDOMECHANICALS) IMPLANT
SOLUTION ANTI FOG 6CC (MISCELLANEOUS) ×3 IMPLANT
SPONGE GAUZE 4X4 12PLY (GAUZE/BANDAGES/DRESSINGS) ×3 IMPLANT
SPONGE LAP 18X18 X RAY DECT (DISPOSABLE) ×3 IMPLANT
STAPLE ECHEON FLEX 60 POW ENDO (STAPLE) ×1 IMPLANT
STAPLER CIRC CVD 29MM 37CM (STAPLE) ×1 IMPLANT
STAPLER VISISTAT 35W (STAPLE) ×3 IMPLANT
SUCTION POOLE TIP (SUCTIONS) ×3 IMPLANT
SUT PDS AB 0 CTX 60 (SUTURE) IMPLANT
SUT PDS AB 1 CTX 36 (SUTURE) ×1 IMPLANT
SUT PDS AB 1 TP1 96 (SUTURE) IMPLANT
SUT SILK 2 0 (SUTURE) ×3
SUT SILK 2 0 SH CR/8 (SUTURE) ×3 IMPLANT
SUT SILK 2 0SH CR/8 30 (SUTURE) IMPLANT
SUT SILK 2-0 18XBRD TIE 12 (SUTURE) ×2 IMPLANT
SUT SILK 2-0 30XBRD TIE 12 (SUTURE) IMPLANT
SUT SILK 3 0 (SUTURE) ×3
SUT SILK 3 0 SH CR/8 (SUTURE) ×3 IMPLANT
SUT SILK 3-0 18XBRD TIE 12 (SUTURE) ×2 IMPLANT
SUT VIC AB 1 CTX 18 (SUTURE) IMPLANT
SUT VIC AB 2-0 CT1 27 (SUTURE) ×3
SUT VIC AB 2-0 CT1 TAPERPNT 27 (SUTURE) IMPLANT
SUT VIC AB 3-0 SH 18 (SUTURE) IMPLANT
SYR BULB IRRIGATION 50ML (SYRINGE) ×3 IMPLANT
SYS LAPSCP GELPORT 120MM (MISCELLANEOUS) ×3
SYSTEM LAPSCP GELPORT 120MM (MISCELLANEOUS) IMPLANT
TOWEL OR 17X26 10 PK STRL BLUE (TOWEL DISPOSABLE) ×9 IMPLANT
TRAY FOLEY CATH 14FRSI W/METER (CATHETERS) ×3 IMPLANT
TRAY LAP CHOLE (CUSTOM PROCEDURE TRAY) ×3 IMPLANT
TROCAR BLADELESS OPT 5 100 (ENDOMECHANICALS) IMPLANT
TROCAR BLADELESS OPT 5 75 (ENDOMECHANICALS) ×9 IMPLANT
TROCAR ENDOPATH XCEL 12X100 BL (ENDOMECHANICALS) IMPLANT
TROCAR XCEL 12X100 BLDLESS (ENDOMECHANICALS) IMPLANT
TROCAR XCEL BLUNT TIP 100MML (ENDOMECHANICALS) IMPLANT
TROCAR XCEL NON-BLD 11X100MML (ENDOMECHANICALS) IMPLANT
TUBING FILTER THERMOFLATOR (ELECTROSURGICAL) ×3 IMPLANT
YANKAUER SUCT BULB TIP 10FT TU (MISCELLANEOUS) ×3 IMPLANT
YANKAUER SUCT BULB TIP NO VENT (SUCTIONS) ×3 IMPLANT

## 2013-02-03 NOTE — Anesthesia Procedure Notes (Signed)
Procedure Name: Intubation Date/Time: 02/03/2013 11:05 AM Performed by: Leroy Libman L Patient Re-evaluated:Patient Re-evaluated prior to inductionOxygen Delivery Method: Circle system utilized Preoxygenation: Pre-oxygenation with 100% oxygen Intubation Type: IV induction Ventilation: Mask ventilation without difficulty and Oral airway inserted - appropriate to patient size Laryngoscope Size: Hyacinth Meeker and 2 Grade View: Grade II Tube type: Oral Tube size: 7.5 mm Number of attempts: 2 Airway Equipment and Method: Stylet Placement Confirmation: ETT inserted through vocal cords under direct vision,  breath sounds checked- equal and bilateral and positive ETCO2 Secured at: 21 cm Tube secured with: Tape Dental Injury: Teeth and Oropharynx as per pre-operative assessment

## 2013-02-03 NOTE — Anesthesia Preprocedure Evaluation (Addendum)
Anesthesia Evaluation  Patient identified by MRN, date of birth, ID band Patient awake    Reviewed: Allergy & Precautions, H&P , NPO status , Patient's Chart, lab work & pertinent test results  Airway Mallampati: II TM Distance: >3 FB Neck ROM: Full    Dental  (+) Teeth Intact and Dental Advisory Given   Pulmonary neg pulmonary ROS, pneumonia -, resolved,  Recent URI,  treated with antibiotics which patient finished yesterday. Residual hoarseness which patient attributes to prior cough.  breath sounds clear to auscultation  Pulmonary exam normal       Cardiovascular negative cardio ROS  Rhythm:Regular Rate:Normal     Neuro/Psych  Headaches, negative neurological ROS  negative psych ROS   GI/Hepatic negative GI ROS, Neg liver ROS,   Endo/Other  negative endocrine ROSMorbid obesity  Renal/GU negative Renal ROS  negative genitourinary   Musculoskeletal negative musculoskeletal ROS (+)   Abdominal   Peds  Hematology negative hematology ROS (+)   Anesthesia Other Findings   Reproductive/Obstetrics negative OB ROS                          Anesthesia Physical Anesthesia Plan  ASA: II  Anesthesia Plan: General   Post-op Pain Management:    Induction: Intravenous  Airway Management Planned: Oral ETT  Additional Equipment:   Intra-op Plan:   Post-operative Plan: Extubation in OR  Informed Consent: I have reviewed the patients History and Physical, chart, labs and discussed the procedure including the risks, benefits and alternatives for the proposed anesthesia with the patient or authorized representative who has indicated his/her understanding and acceptance.   Dental advisory given  Plan Discussed with: CRNA  Anesthesia Plan Comments:         Anesthesia Quick Evaluation

## 2013-02-03 NOTE — Anesthesia Postprocedure Evaluation (Signed)
Anesthesia Post Note  Patient: Virginia Barrett  Procedure(s) Performed: Procedure(s) (LRB): LAPAROSCOPIC SIGMOID COLECTOMY (N/A) PROCTOSCOPY  Anesthesia type: General  Patient location: PACU  Post pain: Pain level controlled  Post assessment: Post-op Vital signs reviewed  Last Vitals:  Filed Vitals:   02/03/13 2032  BP:   Pulse:   Temp:   Resp: 15    Post vital signs: Reviewed  Level of consciousness: sedated  Complications: No apparent anesthesia complications

## 2013-02-03 NOTE — H&P (Signed)
Virginia Barrett is an 37 y.o. female.   Chief Complaint: here for surgery HPI:  37 yo wf presents for planned sigmoidectomy for recurrent diverticulitis.   In review: 37 yo WF referred by Dr Karilyn Cota to discuss colon surgery for recurrent diverticulitis. 37 year old otherwise healthy Caucasian female states she developed extreme left lower quadrant abdominal pain in October of 2013 on Wednesday afternoon. She initially thought that she was constipated. She felt that she was becoming dehydrated. She went to an urgent care clinic on Friday and was subsequently sent to [facility] where CT scan was performed which demonstrated sigmoid diverticulitis with a microperforation. They recommended admission to the hospital but the patient deferred admission and elected to be treated at home with oral antibiotics for one week. She states that her abdominal pain resolved. However in January she had a recurrent bout of left lower quadrant pain not as severe and was treated with another round of oral antibiotics as an outpatient by Dr. Juanetta Gosling. She was set up for a colonoscopy which she underwent in April. During the colonoscopy she reportedly was found to have some areas of active inflammation in her sigmoid colon and she was placed on antibiotics post procedure. She states that she has not had any abdominal pain of late. She reports daily bowel movements. She denies any melena or hematochezia. She denies any nausea or vomiting. She denies any family history of colon cancer. She denies any weight change. She denies any prior abdominal surgery. She states that she has had regular bowel movements her entire life. She denies any discharge from her vagina. She denies any dysuria or pneumaturia.   Past Medical History  Diagnosis Date  . Diverticulitis   . Headache(784.0)     occasional   . Pneumonia     hx of walking pneumonia   . Precancerous skin lesion   . Sinus infection     diagnosed on 01/28/13     Past  Surgical History  Procedure Laterality Date  . Mouth surgery      wisdom teeth removed  . Colonoscopy N/A 11/17/2012    Procedure: COLONOSCOPY;  Surgeon: Malissa Hippo, MD;  Location: [facility];  Service: Endoscopy;  Laterality: N/A;  1200-moved to1030 Ann to notify pt    Family History  Problem Relation Age of Onset  . Colon polyps Mother   . Diabetes Mother   . COPD Mother   . Colon polyps Other   . Colon cancer Neg Hx   . Hodgkin's lymphoma Father   . Cancer Maternal Aunt     breast  . Cancer Maternal Grandmother     breast   Social History:  reports that she has never smoked. She has never used smokeless tobacco. She reports that  drinks alcohol. She reports that she does not use illicit drugs.  Allergies:  Allergies  Allergen Reactions  . Cinnamon Anaphylaxis  . Sulfur Hives  . Ciprofloxacin     Rash  . Codeine Hives and Swelling  . Penicillins Hives  . Red Dye Hives    Medications Prior to Admission  Medication Sig Dispense Refill  . acetaminophen (TYLENOL) 500 MG tablet Take 1,000 mg by mouth every 6 (six) hours as needed for pain.      [name] amoxicillin (AMOXIL) 250 MG capsule Take by mouth daily.      . bifidobacterium infantis (ALIGN) capsule Take 1 capsule by mouth daily.      . fluticasone (FLONASE) 50 MCG/ACT nasal  spray Place 2 sprays into the nose daily.      . Homeopathic Products (ZICAM COLD REMEDY PO) Take by mouth. Patient took on 01/28/13 and 01/29/13.      [name] Phenylephrine-DM-GG-APAP (MUCINEX FAST-MAX SEVERE COLD) 5-10-200-325 MG/10ML LIQD Take by mouth. One time dose on 01/28/13      . sulfamethoxazole-trimethoprim (BACTRIM DS) 800-160 MG per tablet Take 1 tablet by mouth 2 (two) times daily. Stopped on 01/30/13        Results for orders placed during the hospital encounter of 02/01/13 (from the past 48 hour(s))  TYPE AND SCREEN     Status: None   Collection Time    02/01/13 11:50 AM      Result Value Range   ABO/RH(D) A NEG     Antibody Screen NEG      Sample Expiration 02/06/2013    CBC WITH DIFFERENTIAL     Status: Abnormal   Collection Time    02/01/13 11:55 AM      Result Value Range   WBC 10.6 (*) 4.0 - 10.5 K/uL   RBC 4.61  3.87 - 5.11 MIL/uL   Hemoglobin 13.7  12.0 - 15.0 g/dL   HCT 40.9  81.1 - 91.4 %   MCV 90.0  78.0 - 100.0 fL   MCH 29.7  26.0 - 34.0 pg   MCHC 33.0  30.0 - 36.0 g/dL   RDW 78.2  95.6 - 21.3 %   Platelets 396  150 - 400 K/uL   Neutrophils Relative % 61  43 - 77 %   Neutro Abs 6.5  1.7 - 7.7 K/uL   Lymphocytes Relative 22  12 - 46 %   Lymphs Abs 2.3  0.7 - 4.0 K/uL   Monocytes Relative 11  3 - 12 %   Monocytes Absolute 1.1 (*) 0.1 - 1.0 K/uL   Eosinophils Relative 6 (*) 0 - 5 %   Eosinophils Absolute 0.6  0.0 - 0.7 K/uL   Basophils Relative 0  0 - 1 %   Basophils Absolute 0.0  0.0 - 0.1 K/uL  COMPREHENSIVE METABOLIC PANEL     Status: Abnormal   Collection Time    02/01/13 11:55 AM      Result Value Range   Sodium 138  135 - 145 mEq/L   Potassium 4.3  3.5 - 5.1 mEq/L   Chloride 101  96 - 112 mEq/L   CO2 27  19 - 32 mEq/L   Glucose, Bld 99  70 - 99 mg/dL   BUN 9  6 - 23 mg/dL   Creatinine, Ser 0.86  0.50 - 1.10 mg/dL   Calcium 9.8  8.4 - 57.8 mg/dL   Total Protein 7.8  6.0 - 8.3 g/dL   Albumin 3.8  3.5 - 5.2 g/dL   AST 19  0 - 37 U/L   ALT 23  0 - 35 U/L   Alkaline Phosphatase 55  39 - 117 U/L   Total Bilirubin 0.2 (*) 0.3 - 1.2 mg/dL   GFR calc non Af Amer >90  >90 mL/min   GFR calc Af Amer >90  >90 mL/min   Comment:            The eGFR has been calculated     using the CKD EPI equation.     This calculation has not been     validated in all clinical     situations.     eGFR's persistently     <90 mL/min  signify     possible Chronic Kidney Disease.  HCG, SERUM, QUALITATIVE     Status: None   Collection Time    02/01/13 11:55 AM      Result Value Range   Preg, Serum NEGATIVE  NEGATIVE   Comment:            THE SENSITIVITY OF THIS     METHODOLOGY IS >10 mIU/mL.  ABO/RH     Status:  None   Collection Time    02/01/13  1:00 PM      Result Value Range   ABO/RH(D) A NEG     Dg Chest 2 View  02/01/2013   *RADIOLOGY REPORT*  Clinical Data: Recent sinus infection.  Preoperative evaluation for lap for.  The sigmoid colectomy.  Nonsmoker  CHEST - 2 VIEW  Comparison: None.  Findings: Heart and mediastinal contours are within normal limits. The lung fields appear clear with no signs of focal infiltrate or congestive failure.  No pleural fluid or significant peribronchial cuffing is seen.  Bony structures appear intact.  IMPRESSION: No worrisome focal or acute cardiopulmonary abnormality identified.   Original Report Authenticated By: Rhodia Albright, M.D.    Review of Systems  Constitutional: Negative for fever, chills and weight loss.  HENT: Positive for congestion. Negative for hearing loss, ear pain, neck pain, tinnitus and ear discharge.   Eyes: Negative for blurred vision, double vision and pain.  Respiratory: Positive for cough. Negative for sputum production and shortness of breath.        Had a recent sinus infection; finished abx yesterday. No f/c. Cough gone. occas sputum production- clear  Cardiovascular: Negative for chest pain, palpitations, leg swelling and PND.  Gastrointestinal: Negative for nausea, vomiting and abdominal pain.       Had a mild episode of lower abdominal pain first few days of July - treated with bactrim; resolved after 48hrs  Genitourinary: Negative for dysuria, urgency, frequency and flank pain.  Musculoskeletal: Negative for back pain and joint pain.  Neurological: Negative for dizziness, tingling, sensory change, focal weakness, loss of consciousness and headaches.  Endo/Heme/Allergies: Does not bruise/bleed easily.  Psychiatric/Behavioral: Negative for depression, memory loss and substance abuse. The patient is not nervous/anxious.     Blood pressure 128/73, pulse 103, temperature 97.6 F (36.4 C), temperature source Oral, resp. rate 16,  last menstrual period 01/07/2013, SpO2 98.00%. Physical Exam  Vitals reviewed. Constitutional: She is oriented to person, place, and time. She appears well-developed and well-nourished. No distress.  obese  HENT:  Head: Normocephalic and atraumatic.  Right Ear: External ear normal.  Left Ear: External ear normal.  Eyes: Conjunctivae are normal. No scleral icterus.  Neck: Normal range of motion. Neck supple. No tracheal deviation present. No thyromegaly present.  Cardiovascular: Normal rate and normal heart sounds.   Respiratory: Effort normal and breath sounds normal. No stridor. No respiratory distress. She has no wheezes.  GI: She exhibits no distension. There is no tenderness. There is no rebound and no guarding.  Musculoskeletal: She exhibits no edema and no tenderness.  Lymphadenopathy:    She has no cervical adenopathy.  Neurological: She is alert and oriented to person, place, and time. She exhibits normal muscle tone.  Skin: Skin is warm and dry. No rash noted. She is not diaphoretic. No erythema. No pallor.  Psychiatric: She has a normal mood and affect. Her behavior is normal. Judgment and thought content normal.     Assessment/Plan Obesity Sigmoid diverticular disease, possible diverticulitis  To OR for lap assisted sigmoid colectomy Scds, subcu heparin (given) Perioperative entereg (given)  All questions asked and answered. Risks, benefits, alternative treatments previously discussed.   Mary Sella. Andrey Campanile, MD, FACS General, Bariatric, & Minimally Invasive Surgery [facility], [place]   [facility] M 02/03/2013, 10:28 AM

## 2013-02-03 NOTE — Transfer of Care (Signed)
Immediate Anesthesia Transfer of Care Note  Patient: Virginia Barrett  Procedure(s) Performed: Procedure(s): LAPAROSCOPIC SIGMOID COLECTOMY (N/A) PROCTOSCOPY  Patient Location: PACU  Anesthesia Type:General  Level of Consciousness: awake, alert , oriented and patient cooperative  Airway & Oxygen Therapy: Patient Spontanous Breathing and Patient connected to face mask oxygen  Post-op Assessment: Report given to PACU RN, Post -op Vital signs reviewed and stable and Patient moving all extremities X 4  Post vital signs: Reviewed and stable  Complications: No apparent anesthesia complications

## 2013-02-03 NOTE — Progress Notes (Addendum)
Patient has ben treated for sinus infection. Afebrile. Still has a cough and a hoarse voice. Dr Andrey Campanile aware.

## 2013-02-03 NOTE — Preoperative (Signed)
Beta Blockers   Reason not to administer Beta Blockers:Not Applicable 

## 2013-02-03 NOTE — Brief Op Note (Signed)
02/03/2013  2:13 PM  PATIENT:  Virginia Barrett  37 y.o. female  PRE-OPERATIVE DIAGNOSIS:  sigmoid diverticular disease; h/o complicated sigmoid diverticulitis   POST-OPERATIVE DIAGNOSIS:  same   PROCEDURE:  Procedure(s): LAPAROSCOPIC SIGMOID COLECTOMY (N/A) PROCTOSCOPY  SURGEON:  Surgeon(s) and Role:    * Atilano Ina, MD - Primary    * Kandis Cocking, MD - Assisting  PHYSICIAN ASSISTANT: none  ASSISTANTS: see above   ANESTHESIA:   general  EBL:  Total I/O In: 2450 [I.V.:2450] Out: 250 [Urine:150; Blood:100]  BLOOD ADMINISTERED:none  DRAINS: Urinary Catheter (Foley)   LOCAL MEDICATIONS USED:  MARCAINE     SPECIMEN:  Source of Specimen:  sigmoid colon, stitch marks proximal colon  DISPOSITION OF SPECIMEN:  PATHOLOGY  COUNTS:  YES  TOURNIQUET:  * No tourniquets in log *  DICTATION: .Other Dictation: Dictation Number [id number]  PLAN OF CARE: Admit to inpatient   PATIENT DISPOSITION:  PACU - hemodynamically stable.   Delay start of Pharmacological VTE agent (>24hrs) due to surgical blood loss or risk of bleeding: no  Mary Sella. Andrey Campanile, MD, FACS General, Bariatric, & Minimally Invasive Surgery [facility], [place]

## 2013-02-03 NOTE — Progress Notes (Signed)
PCA fentanyl was checked by Gaetano Net, RN CPAN

## 2013-02-04 ENCOUNTER — Encounter (HOSPITAL_COMMUNITY): Payer: Self-pay | Admitting: General Surgery

## 2013-02-04 LAB — CBC
MCH: 30.5 pg (ref 26.0–34.0)
MCV: 88.9 fL (ref 78.0–100.0)
Platelets: 329 10*3/uL (ref 150–400)
RBC: 3.87 MIL/uL (ref 3.87–5.11)

## 2013-02-04 LAB — BASIC METABOLIC PANEL
CO2: 28 mEq/L (ref 19–32)
Calcium: 8.6 mg/dL (ref 8.4–10.5)
Creatinine, Ser: 0.72 mg/dL (ref 0.50–1.10)
Glucose, Bld: 133 mg/dL — ABNORMAL HIGH (ref 70–99)

## 2013-02-04 NOTE — Progress Notes (Signed)
1 Day Post-Op  Subjective: No major events. Sore. Tender in RLQ. No n/v. No flatus.   Objective: Vital signs in last 24 hours: Temp:  [97.6 F (36.4 C)-98.9 F (37.2 C)] 98.4 F (36.9 C) (07/11 0550) Pulse Rate:  [64-103] 78 (07/11 0550) Resp:  [15-21] 18 (07/11 0550) BP: (98-145)/(43-73) 98/64 mmHg (07/11 0550) SpO2:  [94 %-100 %] 99 % (07/11 0550) Weight:  [266 lb 12.1 oz (121 kg)] 266 lb 12.1 oz (121 kg) (07/10 1900) Last BM Date: 02/02/13  Intake/Output from previous day: 07/10 0701 - 07/11 0700 In: 4003.8 [I.V.:4003.8] Out: 3975 [Urine:3875; Blood:100] Intake/Output this shift:    Alert, nad cta b/l Reg Obese, soft, approp TTP. Dressings c/d/i. Some BS +SCD  Lab Results:   Recent Labs  02/01/13 1155 02/04/13 0400  WBC 10.6* 13.4*  HGB 13.7 11.8*  HCT 41.5 34.4*  PLT 396 329   BMET  Recent Labs  02/01/13 1155 02/04/13 0400  NA 138 136  K 4.3 4.0  CL 101 102  CO2 27 28  GLUCOSE 99 133*  BUN 9 6  CREATININE 0.64 0.72  CALCIUM 9.8 8.6   PT/INR No results found for this basename: LABPROT, INR,  in the last 72 hours ABG No results found for this basename: PHART, PCO2, PO2, HCO3,  in the last 72 hours  Studies/Results: No results found.  Anti-infectives: Anti-infectives   Start     Dose/Rate Route Frequency Ordered Stop   02/03/13 1030  clindamycin (CLEOCIN) IVPB 900 mg  Status:  Discontinued     900 mg 100 mL/hr over 30 Minutes Intravenous To Surgery 02/03/13 1023 02/03/13 1605   02/03/13 1030  gentamicin (GARAMYCIN) 420 mg in dextrose 5 % 100 mL IVPB  Status:  Discontinued     420 mg 110.5 mL/hr over 60 Minutes Intravenous To Surgery 02/03/13 1023 02/03/13 1605      Assessment/Plan: s/p Procedure(s): LAPAROSCOPIC SIGMOID COLECTOMY (N/A) PROCTOSCOPY  D/c foley Cont IVF Cont lovenox, scds Ice chips, sips of water, entereg; hopefully can adv to sips of liquids Saturday If hgb stays stable, will add toradol tomorrow; cont PCA OOB,  ambulate, IS  Mary Sella. Andrey Campanile, MD, FACS General, Bariatric, & Minimally Invasive Surgery [facility], [place]   LOS: 1 day    Atilano Ina 02/04/2013

## 2013-02-04 NOTE — Care Management Note (Addendum)
    Page 1 of 1   02/09/2013     10:24:41 AM   CARE MANAGEMENT NOTE 02/09/2013  Patient:  Virginia Barrett, Virginia Barrett   Account Number:  [id number]  Date Initiated:  02/04/2013  Documentation initiated by:  Lorenda Ishihara  Subjective/Objective Assessment:   37 yo female admitted s/p lap colectomy. PTA lived at home with mother.     Action/Plan:   Home when stable   Anticipated DC Date:  02/09/2013   Anticipated DC Plan:  HOME/SELF CARE      DC Planning Services  CM consult      Choice offered to / List presented to:             Status of service:  Completed, signed off Medicare Important Message given?   (If response is "NO", the following Medicare IM given date fields will be blank) Date Medicare IM given:   Date Additional Medicare IM given:    Discharge Disposition:  HOME/SELF CARE  Per UR Regulation:  Reviewed for med. necessity/level of care/duration of stay  If discussed at Long Length of Stay Meetings, dates discussed:    Comments:

## 2013-02-04 NOTE — Op Note (Signed)
[name], RULE NO.:  [id number]  MEDICAL RECORD NO.:  [id number]  LOCATION:  1537                         FACILITY:  [facility]  PHYSICIAN:  Virginia Sella. Andrey Campanile, MD, FACSDATE OF BIRTH:  November 03, 1975  DATE OF PROCEDURE:  02/03/2013 DATE OF DISCHARGE:                              OPERATIVE REPORT   PREOPERATIVE DIAGNOSES: 1. Sigmoid diverticular disease. 2. History of complicated sigmoid diverticulitis.  POSTOPERATIVE DIAGNOSES: 1. Sigmoid diverticular disease. 2. History of complicated sigmoid diverticulitis.  PROCEDURE: 1. Laparoscopic sigmoid colectomy. 2. Rigid proctoscopy.  SURGEON:  Virginia Sella. Andrey Campanile, MD, FACS.  ASSISTANT SURGEON:  Sandria Bales. Ezzard Standing, M.D. FACS  ANESTHESIA:  General +0.25% Marcaine with epinephrine.  SPECIMENS:  Sigmoid colon, stitch marks of proximal margin.  FINDINGS:  The patient has had end-to-end anastomosis performed with a 29 mm EEA stapler at about 26cm from anal verge.  There was no air bubbles on leak test.  INDICATIONS FOR PROCEDURE:  The patient is a very pleasant 37 year old Caucasian female who was hospitalized back in the fall with episode of complicated sigmoid diverticulitis with a microperforation.  This was managed medically.  In followup, she had a colonoscopy, which demonstrated active diverticulitis in 1 area of sigmoid colon.  She was also treated on at least 2-3 other occasions with diverticulitis as an outpatient.  She was referred to my office for consideration for elective sigmoid colectomy.  We discussed the pros and cons of elective sigmoid colectomy versus ongoing medical management of her diverticular disease.  We discussed the risks and benefits of surgical colectomy including, but not limited to, bleeding, infection, injury to surrounding structures, need to convert to an open procedure, anastomotic stricture, anastomotic leak, blood clot formation, incisional hernia, wound infection, need for additional  procedures, bowel obstruction, urinary retention, pulmonary and cardiac complications, as well as ileus, prolonged hospitalization, and need for additional procedures.  She elected to proceed to surgery.  DESCRIPTION OF PROCEDURE:  The patient was given 5000 units of subcutaneous heparin and a dose of entereg preoperatively.  She was then brought to the operating room, placed supine on the operating table after informed consent was obtained.  She then underwent general endotracheal anesthesia.  She was placed in lithotomy position with sequential compression devices in place.  She had the appropriate padding.  Her arms were tucked at the sides with appropriate padding.  A Foley catheter had been placed.  Her abdomen was prepped with ChloraPrep and her perineum and buttocks were prepped with Betadine.  She was then draped in the standard fashion.  A surgical time-out was performed. Because of her multiple antibiotic allergies, she was given clinda and gentamicin for antibiotic prophylaxis.  I elected to gain entry into her abdominal cavity in the right upper quadrant using the Optiview technique.  A 1-cm incision was made just below the right subcostal margin with a #11 blade.  A 5 mm 0-degree laparoscope was advanced from the abdominal wall and advanced into the abdominal cavity under direct visualization through all layers of the abdominal wall.  Pneumoperitoneum was smoothly established up to a patient pressure of 15 mmHg.  The laparoscope was advanced and abdominal cavity was surveilled.  There was no evidence of injury to surrounding structure.  The patient had a few abdominal wall deposits of fat along her upper midline.  However, there are no adhesions to her anterior abdominal wall.  A 5-mm trocar was placed in the right mid abdomen in the midclavicular line and 5-mm trocar was placed in the supraumbilical position and a 5-mm trocar was placed in the left lower quadrant.  All of  these were done under laparoscopic visualization and after local had been infiltrated.  The patient was placed in Trendelenburg and rotated to the right.  The small bowel was reflected above the pelvis. Surprisingly, the patient had a very thin sigmoid colon mesentery despite her obesity.  She did not have a lot of intra-abdominal fat, which was quite nice.  At this point, Dr. Ezzard Standing joined me in the operating room. I identified the sigmoid colon, identified some diverticular disease within the sigmoid colon, and identified an area where the teniae coli coalesced and changed into the rectum at the rectosigmoid junction at the sacral promontory. I identified this as my area of distal transection.  The epiploic appendage of the sigmoid colon was grasped and lifted towards the anterior abdominal wall.  I did a medial to lateral dissection.  I identified the vascular pedicle going to the sigmoid colon.  I then made an incision along the peritoneum with a Harmonic scalpel.  She had a very thin mesentery and I was through the mesentery and I took down some of the mesentery distally staying close to the colon.  At this point, I took down some lateral attachments and identified the ureter.  It was easily seen peristalsing in the retroperitoneum.  I continued to take down the sigmoid colon mesentery in a serial fashion using the Harmonic device.  We did come across the sigmoid branch that we ligated with 2 clips proximally and 1 clip distally.  I then transected the middle with Harmonic scalpel.  The mesentery was taken down from the level of the sacral promontory where my distal transection line was going to be.  At this point, we then took the patient out of Trendelenburg and placed a little bit reverse and took down the lateral attachments along the white line of Toldt with the Harmonic Scalpel.  Then the colon was medialized.  We stayed anterior to Gerota's fascia.  Some of this was done in a  blunt fashion as well.  At this point, it appeared that I had adequate proximal mobility of the colon.  I identified the area where I was going to transect the colon distally at the level of the sacral promontory at the rectosigmoid junction.  A 12-mm trocar was placed in the right lower quadrant under direct visualization.  Then using an echelon 60 mm stapler with a blue load, I transected the colon at the rectosigmoid junction.  We then made a mini lower midline incision with a 10 blade above the subcutaneous tissue with electrocautery and opened the fascia up.  GelPort wound protector was placed.  We then brought out the colon, identified an area proximal where I was going to transect it.  We were able to palpate an area where it appears that she had a prior inflammation and we went several inches above this.  A window was made in the mesentery just next to the colon wall and then we freed the mesentery upside with the Harmonic Scalpel.  The echelon was then brought back and used to transect the  colon proximally.  A stitch was placed on the proximal end of the sigmoid colon that was passed off the field.  It did appear that she would have a long rectal stump.  We placed the GelPort back on the abdomen after returning the colon to the abdominal cavity.  At this point, my partner went below and did rigid proctoscope to ensure that an EEA stapler would reach the end of the rectal stump staple line.  It appeared that it was going to be at about 25 cm.  We opened up a 29 mm EEA stapler, and he placed it up into the anus and through the rectum and he was able to bring it all the way to the end of the staple line. We then released pneumoperitoneum and removed GelPort top and brought out the proximal colon cleaned up and freed from surrounding fat around the proximal staple line using a mosquito and the Harmonic.  A pursestring clamp was placed just below the staple line.  I then used a 2-0  Prolene on a Keith needle to make a pursestring suture.  The staple line was then excised with curved Mayos.  This was passed off the field. A 29 mm anvil was placed within the open colon.  There was excellent blood flow to the distal portion of the colon, because there was 2 pulsatile bleeders that started bleeding after we had opened up the staple line. Hemostasis was achieved by placing a figure-of-eight 3-0 silk suture. The anvil was placed into the open colon and the Prolene suture was brought together and tied down securely anvil in place.  It was snugged. We then freed some of the fat off the colon carefully.  We then replaced the colon into the abdomen and replaced the GelPort top and reestablished pneumoperitoneum.  At this point, Dr. Ezzard Standing went below and advanced the stapler from up from the bottom and advanced it towards the rectal stump staple line.  I was able to reposition the staple line so that spike was brought out just posterior to the staple line.  The spike was advanced through the staple line until the orange tab was visible.  I then brought the proximal colon and grabbed the anvil and placed it onto the spike ensuring that the proximal colon mesentery was not twisted.  It was advanced and a click was heard.  I again confirmed the orientation of the proximal colon, ensuring that the mesentery was not twisted.  The stapler was brought together and held compression and then fired.  The stapler was then opened and removed.  There were 2 complete intact anastomotic donuts.  At this point, rigid proctoscopy was performed.  A bowel clamp was placed just above the anastomosis. There was good insufflation of the rectum with air getting past the anastomosis.  Saline had been placed into the rectum and there was no evidence of bubbles.  The proctoscope was removed and the air out of the rectum was released.  The pelvis was irrigated laparoscopically and irrigation fluid was  removed.  At this point, the JL-4 was released including the wound protector and the dirty instruments were passed off the field.  We changed gloves and redraped.  The mini lower midline incision was closed in 3 layers.  The peritoneum was 1st closed with a running 2-0 Vicryl.  The fascia was then reapproximated with two #1 nonlooped PDS sutures.  The subcutaneous tissue was irrigated and the skin was reapproximated with skin staples.  We reestablished pneumoperitoneum and confirmed nothing was in our closure.  There was no air leak.  Pneumoperitoneum was released.  Remaining trocar sites were closed with skin staples.  Sterile dressings were applied.  The patient was extubated, taken to the recovery room in a stable condition.  All needle, instrument, and sponge counts were correct x2.  There were no immediate issues.  The patient tolerated the procedure well.     Virginia Sella. Andrey Campanile, MD, FACS     EMW/MEDQ  D:  02/03/2013  T:  02/04/2013  Job:  [id number]

## 2013-02-05 LAB — CBC
HCT: 34.8 % — ABNORMAL LOW (ref 36.0–46.0)
Hemoglobin: 11.5 g/dL — ABNORMAL LOW (ref 12.0–15.0)
MCH: 29.9 pg (ref 26.0–34.0)
MCHC: 33 g/dL (ref 30.0–36.0)

## 2013-02-05 LAB — GLUCOSE, CAPILLARY: Glucose-Capillary: 88 mg/dL (ref 70–99)

## 2013-02-05 LAB — BASIC METABOLIC PANEL
BUN: 5 mg/dL — ABNORMAL LOW (ref 6–23)
Chloride: 101 mEq/L (ref 96–112)
Creatinine, Ser: 0.72 mg/dL (ref 0.50–1.10)
GFR calc Af Amer: >90 mL/min (ref >90)

## 2013-02-06 NOTE — Progress Notes (Signed)
Patient ID: Virginia Barrett, female   DOB: 1975-12-09, 37 y.o.   MRN: [id number] [facility] Surgery Progress Note:   3 Days Post-Op  Subjective: Mental status is clear.  No complaints except no flatus Objective: Vital signs in last 24 hours: Temp:  [98 F (36.7 C)-98.4 F (36.9 C)] 98.4 F (36.9 C) (07/13 0515) Pulse Rate:  [73-84] 77 (07/13 0515) Resp:  [16-24] 16 (07/13 0807) BP: (95-102)/(62-69) 102/67 mmHg (07/13 0515) SpO2:  [92 %-99 %] 95 % (07/13 0807)  Intake/Output from previous day: 07/12 0701 - 07/13 0700 In: 3190.8 [P.O.:120; I.V.:3070.8] Out: 2900 [Urine:2900] Intake/Output this shift:    Physical Exam: Work of breathing is normal.  Abdomen nontender;  Lab Results:  Results for orders placed during the hospital encounter of 02/03/13 (from the past 48 hour(s))  BASIC METABOLIC PANEL     Status: Abnormal   Collection Time    02/05/13  4:30 AM      Result Value Range   Sodium 135  135 - 145 mEq/L   Potassium 3.8  3.5 - 5.1 mEq/L   Chloride 101  96 - 112 mEq/L   CO2 28  19 - 32 mEq/L   Glucose, Bld 104 (*) 70 - 99 mg/dL   BUN 5 (*) 6 - 23 mg/dL   Creatinine, Ser 4.09  0.50 - 1.10 mg/dL   Calcium 8.7  8.4 - 81.1 mg/dL   GFR calc non Af Amer >90  >90 mL/min   GFR calc Af Amer >90  >90 mL/min   Comment:            The eGFR has been calculated     using the CKD EPI equation.     This calculation has not been     validated in all clinical     situations.     eGFR's persistently     <90 mL/min signify     possible Chronic Kidney Disease.  MAGNESIUM     Status: None   Collection Time    02/05/13  4:30 AM      Result Value Range   Magnesium 1.9  1.5 - 2.5 mg/dL  CBC     Status: Abnormal   Collection Time    02/05/13  4:30 AM      Result Value Range   WBC 10.3  4.0 - 10.5 K/uL   RBC 3.84 (*) 3.87 - 5.11 MIL/uL   Hemoglobin 11.5 (*) 12.0 - 15.0 g/dL   HCT 91.4 (*) 78.2 - 95.6 %   MCV 90.6  78.0 - 100.0 fL   MCH 29.9  26.0 - 34.0 pg   MCHC 33.0  30.0 -  36.0 g/dL   RDW 21.3  08.6 - 57.8 %   Platelets 323  150 - 400 K/uL  GLUCOSE, CAPILLARY     Status: None   Collection Time    02/05/13  5:17 PM      Result Value Range   Glucose-Capillary 88  70 - 99 mg/dL    Radiology/Results: No results found.  Anti-infectives: Anti-infectives   Start     Dose/Rate Route Frequency Ordered Stop   02/03/13 1030  clindamycin (CLEOCIN) IVPB 900 mg  Status:  Discontinued     900 mg 100 mL/hr over 30 Minutes Intravenous To Surgery 02/03/13 1023 02/03/13 1605   02/03/13 1030  gentamicin (GARAMYCIN) 420 mg in dextrose 5 % 100 mL IVPB  Status:  Discontinued     420 mg 110.5 mL/hr over  60 Minutes Intravenous To Surgery 02/03/13 1023 02/03/13 1605      Assessment/Plan: Problem List: Patient Active Problem List   Diagnosis Date Noted  . Diverticulitis of colon (without mention of hemorrhage) 10/13/2012    Priority: High    Ileus post colectomy;  Awaiting flatus.  Continue NPO for now. 3 Days Post-Op    LOS: 3 days   Matt B. Daphine Deutscher, MD, [facility] [phone] beeper [phone]  02/06/2013 8:22 AM

## 2013-02-07 MED ORDER — KETOROLAC TROMETHAMINE 30 MG/ML IJ SOLN
30.0000 mg | Freq: Three times a day (TID) | INTRAMUSCULAR | Status: DC
  Administered 2013-02-07 – 2013-02-09 (×5): 30 mg via INTRAVENOUS
  Filled 2013-02-07 (×7): qty 1

## 2013-02-07 MED ORDER — OXYCODONE-ACETAMINOPHEN 5-325 MG PO TABS
1.0000 | ORAL_TABLET | ORAL | Status: DC | PRN
  Administered 2013-02-07: 1 via ORAL
  Administered 2013-02-07: 2 via ORAL
  Administered 2013-02-08 – 2013-02-09 (×4): 1 via ORAL
  Filled 2013-02-07: qty 2
  Filled 2013-02-07 (×5): qty 1

## 2013-02-07 MED ORDER — MORPHINE SULFATE 2 MG/ML IJ SOLN: 1.0000 mg | INTRAMUSCULAR | Status: DC | PRN

## 2013-02-07 NOTE — Progress Notes (Signed)
4 Days Post-Op  Subjective: Having some flatus, mainly in AM, had some yesterday and this am. No n/v. Ambulating. Had a little bit of blood per rectum after 1st episode of flatus yesterday am. C/o pain between shoulder blades.  Objective: Vital signs in last 24 hours: Temp:  [98.3 F (36.8 C)-98.8 F (37.1 C)] 98.4 F (36.9 C) (07/14 0555) Pulse Rate:  [77-82] 77 (07/14 0555) Resp:  [14-26] 16 (07/14 0555) BP: (104-121)/(62-72) 117/72 mmHg (07/14 0555) SpO2:  [93 %-100 %] 93 % (07/14 0555) Last BM Date: 02/02/13  Intake/Output from previous day: 07/13 0701 - 07/14 0700 In: 3538.8 [P.O.:120; I.V.:3418.8] Out: 2400 [Urine:2400] Intake/Output this shift: Total I/O In: -  Out: 300 [Urine:300]  Alert, nad cta  Reg Soft, obese, incisions c/d/i. hypoBS No edema   Lab Results:   Recent Labs  02/05/13 0430  WBC 10.3  HGB 11.5*  HCT 34.8*  PLT 323   BMET  Recent Labs  02/05/13 0430  NA 135  K 3.8  CL 101  CO2 28  GLUCOSE 104*  BUN 5*  CREATININE 0.72  CALCIUM 8.7   PT/INR No results found for this basename: LABPROT, INR,  in the last 72 hours ABG No results found for this basename: PHART, PCO2, PO2, HCO3,  in the last 72 hours  Studies/Results: No results found.  Anti-infectives: Anti-infectives   Start     Dose/Rate Route Frequency Ordered Stop   02/03/13 1030  clindamycin (CLEOCIN) IVPB 900 mg  Status:  Discontinued     900 mg 100 mL/hr over 30 Minutes Intravenous To Surgery 02/03/13 1023 02/03/13 1605   02/03/13 1030  gentamicin (GARAMYCIN) 420 mg in dextrose 5 % 100 mL IVPB  Status:  Discontinued     420 mg 110.5 mL/hr over 60 Minutes Intravenous To Surgery 02/03/13 1023 02/03/13 1605      Assessment/Plan: s/p Procedure(s): LAPAROSCOPIC SIGMOID COLECTOMY (N/A) PROCTOSCOPY  Afebrile, no tachycardia. +flatus  Will start clears. If tolerates clears this am, will d/c PCA this pm. Cont entereg Scds, subcu lovenox Add toradol for pain  control  Mary Sella. Andrey Campanile, MD, FACS General, Bariatric, & Minimally Invasive Surgery [facility] Surgery, [place]   LOS: 4 days    Atilano Ina 02/07/2013

## 2013-02-08 LAB — CBC
HCT: 33.9 % — ABNORMAL LOW (ref 36.0–46.0)
Hemoglobin: 11.4 g/dL — ABNORMAL LOW (ref 12.0–15.0)
MCH: 30.2 pg (ref 26.0–34.0)
MCHC: 33.6 g/dL (ref 30.0–36.0)
MCV: 89.7 fL (ref 78.0–100.0)
RDW: 12.8 % (ref 11.5–15.5)

## 2013-02-08 NOTE — Progress Notes (Signed)
5 Days Post-Op  Subjective: Shoulder pain improved. Ate whole tray of clears last night. No n/v. occas burping. +flatus. Some liquid from bottom, no more blood. ambulating  Objective: Vital signs in last 24 hours: Temp:  [97.3 F (36.3 C)-98.9 F (37.2 C)] 97.8 F (36.6 C) (07/15 0500) Pulse Rate:  [23-84] 84 (07/15 0500) Resp:  [16-20] 16 (07/15 0500) BP: (91-107)/(53-68) 91/53 mmHg (07/15 0500) SpO2:  [94 %-100 %] 98 % (07/15 0500) Last BM Date: 02/07/13  Intake/Output from previous day: 07/14 0701 - 07/15 0700 In: 3475.8 [P.O.:840; I.V.:2635.8] Out: 1650 [Urine:1650] Intake/Output this shift:    Alert, nad cta Reg Obese, soft, mild incisional TTP. +BS. Incisions c/d/i No edema  Lab Results:   Recent Labs  02/08/13 0449  WBC 10.1  HGB 11.4*  HCT 33.9*  PLT 343   BMET No results found for this basename: NA, K, CL, CO2, GLUCOSE, BUN, CREATININE, CALCIUM,  in the last 72 hours PT/INR No results found for this basename: LABPROT, INR,  in the last 72 hours ABG No results found for this basename: PHART, PCO2, PO2, HCO3,  in the last 72 hours  Studies/Results: No results found.  Anti-infectives: Anti-infectives   Start     Dose/Rate Route Frequency Ordered Stop   02/03/13 1030  clindamycin (CLEOCIN) IVPB 900 mg  Status:  Discontinued     900 mg 100 mL/hr over 30 Minutes Intravenous To Surgery 02/03/13 1023 02/03/13 1605   02/03/13 1030  gentamicin (GARAMYCIN) 420 mg in dextrose 5 % 100 mL IVPB  Status:  Discontinued     420 mg 110.5 mL/hr over 60 Minutes Intravenous To Surgery 02/03/13 1023 02/03/13 1605      Assessment/Plan: s/p Procedure(s): LAPAROSCOPIC SIGMOID COLECTOMY (N/A) PROCTOSCOPY  Decreased mIVF D/c'd PCA yesterday Adv to full liquids Cont toradol Cont lovenox Home Wednesday if no issues today  Mary Sella. Andrey Campanile, MD, FACS General, Bariatric, & Minimally Invasive Surgery [facility], [place]   LOS: 5 days    Atilano Ina 02/08/2013

## 2013-02-09 MED ORDER — OXYCODONE-ACETAMINOPHEN 5-325 MG PO TABS: 1.0000 | ORAL_TABLET | ORAL | Status: DC | PRN

## 2013-02-11 NOTE — Discharge Summary (Signed)
Physician Discharge Summary  NIMCO BIVENS ZOX:[id number] DOB: 1975-09-16 DOA: 02/03/2013  PCP: Fredirick Maudlin, MD  Admit date: 02/03/2013 Discharge date: 02/09/13  Recommendations for Outpatient Follow-up:   Follow-up Information   Follow up with Atilano Ina, MD. Schedule an appointment as soon as possible for a visit in 2 weeks.   Contact information:   [address] [place] [address] [phone]       Follow up with [facility]. Schedule an appointment as soon as possible for a visit in 5 days. (for staple removal)    Contact information:   [address] [place] [address] [phone]     Discharge Diagnoses:  Obesity Sigmoid diverticular disease  Surgical Procedure: Laparoscopic sigmoid colectomy July 15  Discharge Condition: Good Disposition: To home  Diet recommendation: Low fiber  Filed Weights   02/03/13 1900  Weight: 266 lb 12.1 oz (121 kg)    Hospital Course:  37 year old obese Caucasian female with a history of complicated sigmoid diverticulitis was admitted for a planned laparoscopic sigmoid colectomy. She had an episode of complicated sigmoid diverticulitis in the fall of 2013 that was managed nonoperatively. She has had several subsequent episodes of diverticulitis of the managed as an outpatient with oral antibiotics. After discussing the pros and cons of elective sigmoid colectomy the patient elected to undergo elective resection in order to prevent additional diverticular episodes. She was managed perioperatively with entereg for bowel recovery. Her Foley catheter was discontinued on postoperative day 1. Her pain was controlled with a PCA. She was given ice chips and sips of water. Her diet was advanced to clear liquids on postoperative day 3 when she started having flatus. Her diet was slowly advanced. On day of discharge she had a bowel movement. She was tolerating solid foods. She was ambulating without  difficulty. She had a little bit of spontaneous drainage from her lower line incision. There is no cellulitis or induration or fluctuance. It appeared that she was evacuating a small subcutaneous hematoma.  BP 107/66  Pulse 73  Temp(Src) 98.4 F (36.9 C) (Oral)  Resp 16  Ht 5\' 7"  (1.702 m)  Wt 266 lb 12.1 oz (121 kg)  BMI 41.77 kg/m2  SpO2 98%  LMP 01/07/2013  Gen: alert, NAD, non-toxic appearing Pupils: equal, no scleral icterus Pulm: Lungs clear to auscultation, symmetric chest rise CV: regular rate and rhythm Abd: soft, mild tenderness along incision, nondistended. Well-healed trocar sites. No cellulitis. No incisional hernia Ext: no edema, no calf tenderness Skin: no rash, no jaundice    Discharge Instructions  Discharge Orders   Future Appointments Provider Department Dept Phone   02/14/2013 10:00 AM [facility] Surgery, [place] [phone]   Future Orders Complete By Expires     Diet general  As directed     Discharge instructions  As directed     Comments:      See [facility] discharge instructions    Increase activity slowly  As directed         Medication List    STOP taking these medications       amoxicillin 250 MG capsule  Commonly known as:  AMOXIL     bifidobacterium infantis capsule      TAKE these medications       acetaminophen 500 MG tablet  Commonly known as:  TYLENOL  Take 1,000 mg by mouth every 6 (six) hours as needed for pain.  fluticasone 50 MCG/ACT nasal spray  Commonly known as:  FLONASE  Place 2 sprays into the nose daily.     MUCINEX FAST-MAX SEVERE COLD 5-10-200-325 MG/10ML Liqd  Generic drug:  Phenylephrine-DM-GG-APAP  Take by mouth. One time dose on 01/28/13     oxyCODONE-acetaminophen 5-325 MG per tablet  Commonly known as:  ROXICET  Take 1-2 tablets by mouth every 4 (four) hours as needed for pain.     sulfamethoxazole-trimethoprim 800-160 MG per tablet  Commonly known as:  BACTRIM DS  Take 1 tablet by  mouth 2 (two) times daily. Stopped on 01/30/13     ZICAM COLD REMEDY PO  Take by mouth. Patient took on 01/28/13 and 01/29/13.           Follow-up Information   Follow up with Atilano Ina, MD. Schedule an appointment as soon as possible for a visit in 2 weeks.   Contact information:   [address] [place] [address] [phone]       Follow up with [facility]. Schedule an appointment as soon as possible for a visit in 5 days. (for staple removal)    Contact information:   [address] [place] [address] [phone]       The results of significant diagnostics from this hospitalization (including imaging, microbiology, ancillary and laboratory) are listed below for reference.    Significant Diagnostic Studies: Dg Chest 2 View  02/01/2013   *RADIOLOGY REPORT*  Clinical Data: Recent sinus infection.  Preoperative evaluation for lap for.  The sigmoid colectomy.  Nonsmoker  CHEST - 2 VIEW  Comparison: None.  Findings: Heart and mediastinal contours are within normal limits. The lung fields appear clear with no signs of focal infiltrate or congestive failure.  No pleural fluid or significant peribronchial cuffing is seen.  Bony structures appear intact.  IMPRESSION: No worrisome focal or acute cardiopulmonary abnormality identified.   Original Report Authenticated By: Rhodia Albright, M.D.    Microbiology: No results found for this or any previous visit (from the past 240 hour(s)).   Labs: Basic Metabolic Panel:  Recent Labs Lab 02/05/13 0430  NA 135  K 3.8  CL 101  CO2 28  GLUCOSE 104*  BUN 5*  CREATININE 0.72  CALCIUM 8.7  MG 1.9   CBC:  Recent Labs Lab 02/05/13 0430 02/08/13 0449  WBC 10.3 10.1  HGB 11.5* 11.4*  HCT 34.8* 33.9*  MCV 90.6 89.7  PLT 323 343  CBG:  Recent Labs Lab 02/05/13 1717  GLUCAP 88    Active Problems:   Diverticulitis of colon (without mention of hemorrhage)   Time coordinating  discharge: 10 minutes  Signed:  Atilano Ina, MD [facility] Surgery, [place] [phone] 02/11/2013, 4:15 PM

## 2013-02-14 ENCOUNTER — Encounter (INDEPENDENT_AMBULATORY_CARE_PROVIDER_SITE_OTHER): Payer: Self-pay | Admitting: General Surgery

## 2013-02-14 ENCOUNTER — Ambulatory Visit (INDEPENDENT_AMBULATORY_CARE_PROVIDER_SITE_OTHER): Payer: Managed Care, Other (non HMO) | Admitting: General Surgery

## 2013-02-14 VITALS — BP 104/92 | HR 93 | Temp 97.2°F | Ht 67.0 in | Wt 258.8 lb

## 2013-02-14 DIAGNOSIS — Z4802 Encounter for removal of sutures: Secondary | ICD-10-CM

## 2013-02-14 NOTE — Progress Notes (Addendum)
Patient comes in today s/p lap sig colectomy 02/03/13 by  Dr.Wilson for staple removal...incision looked well with zeros redness or infection but was having a lot of drainage brownish yellowish milky color drainage therefore I had decided that I shouldn't pull the staples and just wait until the patient comes back on Friday 7/25 to see Dr.Wilson, so I got a second opinion and per verbal orders from [facility]. She said to remove all staples.  All the staples  were removed intact and benzoin and 1/2 in steri-strips were placed over incision because once the second to the last staple was removed the incision had a small hole that appeared once the skin fold came apart which was where all the drainage was coming from...after seeing the hole I called Deanna back into the room and that is when she placed all the steri- strips over the incision to approximate the incision line back in place..I instructed the patient along with the patient's mother to call the office anytime should any questions on concerns arise and to keep her appt to see Dr.Wilson on 7/25.Marland KitchenMarland Kitchenpatient and her mother verbalized agreement

## 2013-02-15 ENCOUNTER — Encounter (INDEPENDENT_AMBULATORY_CARE_PROVIDER_SITE_OTHER): Payer: Self-pay | Admitting: General Surgery

## 2013-02-15 ENCOUNTER — Ambulatory Visit (INDEPENDENT_AMBULATORY_CARE_PROVIDER_SITE_OTHER): Payer: Managed Care, Other (non HMO) | Admitting: General Surgery

## 2013-02-15 VITALS — BP 130/78 | HR 88 | Resp 16 | Ht 67.0 in | Wt 257.6 lb

## 2013-02-15 DIAGNOSIS — Z9889 Other specified postprocedural states: Secondary | ICD-10-CM

## 2013-02-15 NOTE — Progress Notes (Signed)
Subjective:     Patient ID: Virginia Barrett, female   DOB: 09/15/1975, 37 y.o.   MRN: [id number]  HPI  She underwent a laparoscopic-assisted sigmoid colectomy 12 days ago by Dr. Andrey Campanile. Her staples were removed yesterday. She was having a little bit of drainage from the wound yesterday but the wound was Steri-Stripped closed. She woke up this morning having some separation the skin and more than serous type drainage and she presents to the office today to have a wound check. She is otherwise doing fairly well.   Review of Systems     Objective:   Physical Exam Abdomen-the inferior aspect of the limited lower midline wound has separated. There is minimal serous drainage from this. It is shallow. No erythema.    Assessment:     Superficial wound separation that is partial. The wound is clean.     Plan:     Clean the wound in the shower daily followed by dry dressing. Keep appointment with Dr. Andrey Campanile in 3 days.

## 2013-02-15 NOTE — Patient Instructions (Signed)
Shower daily and cover the area with a dry absorbent dressing as we discussed. Keep your appointment with Dr. Andrey Campanile on Friday.

## 2013-02-18 ENCOUNTER — Encounter (INDEPENDENT_AMBULATORY_CARE_PROVIDER_SITE_OTHER): Payer: Self-pay | Admitting: General Surgery

## 2013-02-18 ENCOUNTER — Ambulatory Visit (INDEPENDENT_AMBULATORY_CARE_PROVIDER_SITE_OTHER): Payer: Managed Care, Other (non HMO) | Admitting: General Surgery

## 2013-02-18 ENCOUNTER — Telehealth (INDEPENDENT_AMBULATORY_CARE_PROVIDER_SITE_OTHER): Payer: Self-pay | Admitting: General Surgery

## 2013-02-18 VITALS — BP 134/60 | HR 64 | Temp 98.1°F | Resp 16 | Wt 256.8 lb

## 2013-02-18 DIAGNOSIS — Z09 Encounter for follow-up examination after completed treatment for conditions other than malignant neoplasm: Secondary | ICD-10-CM

## 2013-02-18 NOTE — Telephone Encounter (Signed)
LMOM asking patient if she could come in early for today's appt...we have gap in schd

## 2013-02-18 NOTE — Progress Notes (Signed)
Subjective:     Patient ID: Virginia Barrett, female   DOB: 21-Apr-1976, 37 y.o.   MRN: [id number]  HPI 69-year-old obese Caucasian female comes in today for her first followup appointment after undergoing laparoscopic sigmoid colectomy for diverticular disease on July 10. She was in the hospital until July 16. She was seen in the office on July 21 for staple removal from her mini lower midline incision. She unfortunately presented back to the office the following day after having a little bit of superficial skin separation. She has had some intermittent drainage since discharge from the hospital from the incision. It has been clear. She denies any fever, chills, nausea, vomiting, diarrhea, melena or hematochezia. She reports a good appetite. She is taking MiraLAX as needed. She is weaning off her pain medication. She states her abdominal wall is just sore. She states that it's sore walking up stairs. She denies any foul odor or drainage from incision  Review of Systems     Objective:   Physical Exam  BP 134/60  Pulse 64  Temp(Src) 98.1 F (36.7 C)  Resp 16  Wt 256 lb 12.8 oz (116.484 kg)  BMI 40.21 kg/m2  LMP 01/06/2013  Gen: alert, NAD, non-toxic appearing, obese Pupils: equal, no scleral icterus Pulm: Lungs clear to auscultation, symmetric chest rise CV: regular rate and rhythm Abd: soft, nontender, nondistended. Well-healed trocar sites. No cellulitis. No incisional hernia. Min lower midline incision has small amount of superficial skin separation. No fluctuance Ext: no edema, no calf tenderness Skin: no rash, no jaundice     Assessment:     Status post laparoscopic sigmoid colectomy     Plan:     We reviewed her pathology report and she was given a copy of it. Her pathology report showed benign Colorectal mucosa with diverticulosis. For right now I encouraged her to a low fiber diet for the next 2 weeks then she may still resume a high-fiber diet. With respect to the skin  separation, I told her to continue the current wound care that she is doing as well as the wound care that was given to her by Dr. Abbey Chatters. She was instructed to clean the area on a daily basis and cover with dry gauze. I reminded her that she should not do any heavy lifting, pushing, or pulling for another 4 weeks. All of her questions were asked and answered. I will see her back in the office in about 4 weeks  Mary Sella. Andrey Campanile, MD, FACS General, Bariatric, & Minimally Invasive Surgery [facility], [place]

## 2013-02-18 NOTE — Patient Instructions (Signed)
Continue current wound care Call with any questions

## 2013-03-01 ENCOUNTER — Telehealth (INDEPENDENT_AMBULATORY_CARE_PROVIDER_SITE_OTHER): Payer: Self-pay

## 2013-03-01 DIAGNOSIS — G8918 Other acute postprocedural pain: Secondary | ICD-10-CM

## 2013-03-01 MED ORDER — HYDROCODONE-ACETAMINOPHEN 5-325 MG PO TABS: 1.0000 | ORAL_TABLET | Freq: Four times a day (QID) | ORAL | Status: DC | PRN

## 2013-03-01 NOTE — Telephone Encounter (Signed)
Patient calling into office to request a refill on her pain medication.  Patient reports that she has been taking advil for break through pain.  Patient also reports [place] and concerned with having pain that cannot be well controlled with advil or aleve.  Patient aware that Oxycodone is a prescription that will need to be picked up, which she's not able to because of being out of town.  Patient states she walks up 3 flights of steps where they are staying at the beach.  Patient denies having any fevers, nausea/vomiting.  Patient reports that she's having bowel movements without difficulty. Patient advised that it may be tomorrow before we respond to her refill request.

## 2013-03-01 NOTE — Telephone Encounter (Signed)
Can have norco per protocol 

## 2013-03-01 NOTE — Telephone Encounter (Signed)
Called [facility] on [address] in [place], [place] to order Pain medication protocol refill for Norco 5/325mg , take 1 (one) tablet po, q6hrs, prn pain, #30, 0 refills okay per Dr. Andrey Campanile.  Patient aware that prescription  Will be available for pick up later this evening.

## 2013-03-01 NOTE — Addendum Note (Signed)
Addended by: Maryan Puls on: 03/01/2013 04:42 PM   Modules accepted: Orders, Medications

## 2013-03-30 ENCOUNTER — Ambulatory Visit (INDEPENDENT_AMBULATORY_CARE_PROVIDER_SITE_OTHER): Payer: Managed Care, Other (non HMO) | Admitting: General Surgery

## 2013-03-30 ENCOUNTER — Encounter (INDEPENDENT_AMBULATORY_CARE_PROVIDER_SITE_OTHER): Payer: Managed Care, Other (non HMO) | Admitting: General Surgery

## 2013-03-30 ENCOUNTER — Encounter (INDEPENDENT_AMBULATORY_CARE_PROVIDER_SITE_OTHER): Payer: Self-pay | Admitting: General Surgery

## 2013-03-30 VITALS — BP 114/76 | HR 93 | Temp 97.9°F | Resp 16 | Ht 67.0 in | Wt 266.0 lb

## 2013-03-30 DIAGNOSIS — Z09 Encounter for follow-up examination after completed treatment for conditions other than malignant neoplasm: Secondary | ICD-10-CM

## 2013-03-30 NOTE — Patient Instructions (Signed)
GETTING TO GOOD BOWEL HEALTH. Irregular bowel habits such as constipation and diarrhea can lead to many problems over time.  Having one soft bowel movement a day is the most important way to prevent further problems.  The anorectal canal is designed to handle stretching and feces to safely manage our ability to get rid of solid waste (feces, poop, stool) out of our body.  BUT, hard constipated stools can act like ripping concrete bricks and diarrhea can be a burning fire to this very sensitive area of our body, causing inflamed hemorrhoids, anal fissures, increasing risk is perirectal abscesses, abdominal pain/bloating, an making irritable bowel worse.     The goal: ONE SOFT BOWEL MOVEMENT A DAY!  To have soft, regular bowel movements:    Drink at least 8 tall glasses of water a day.     Take plenty of fiber.  Fiber is the undigested part of plant food that passes into the colon, acting s "natures broom" to encourage bowel motility and movement.  Fiber can absorb and hold large amounts of water. This results in a larger, bulkier stool, which is soft and easier to pass. Work gradually over several weeks up to 6 servings a day of fiber (25g a day even more if needed) in the form of: o Vegetables -- Root (potatoes, carrots, turnips), leafy green (lettuce, salad greens, celery, spinach), or cooked high residue (cabbage, broccoli, etc) o Fruit -- Fresh (unpeeled skin & pulp), Dried (prunes, apricots, cherries, etc ),  or stewed ( applesauce)  o Whole grain breads, pasta, etc (whole wheat)  o Bran cereals    Bulking Agents -- This type of water-retaining fiber generally is easily obtained each day by one of the following:  o Psyllium bran -- The psyllium plant is remarkable because its ground seeds can retain so much water. This product is available as Metamucil, Konsyl, Effersyllium, Per Diem Fiber, or the less expensive generic preparation in drug and health food stores. Although labeled a laxative, it really  is not a laxative.  o Methylcellulose -- This is another fiber derived from wood which also retains water. It is available as Citrucel. o Benefiber o Polyethylene Glycol - and "artificial" fiber commonly called Miralax or Glycolax.  It is helpful for people with gassy or bloated feelings with regular fiber o Flax Seed - a less gassy fiber than psyllium   No reading or other relaxing activity while on the toilet. If bowel movements take longer than 5 minutes, you are too constipated   AVOID CONSTIPATION.  High fiber and water intake usually takes care of this.  Sometimes a laxative is needed to stimulate more frequent bowel movements, but    Laxatives are not a good long-term solution as it can wear the colon out. o Osmotics (Milk of Magnesia, Fleets phosphosoda, Magnesium citrate, MiraLax, GoLytely) are safer than  o Stimulants (Senokot, Castor Oil, Dulcolax, Ex Lax)    o Do not take laxatives for more than 7days in a row.    IF SEVERELY CONSTIPATED, try a Bowel Retraining Program: o Do not use laxatives.  o Eat a diet high in roughage, such as bran cereals and leafy vegetables.  o Drink six (6) ounces of prune or apricot juice each morning.  o Eat two (2) large servings of stewed fruit each day.  o Take one (1) heaping tablespoon of a psyllium-based bulking agent twice a day. Use sugar-free sweetener when possible to avoid excessive calories.  o Eat a normal breakfast.  o Set aside 15 minutes after breakfast to sit on the toilet, but do not strain to have a bowel movement.  o If you do not have a bowel movement by the third day, use an enema and repeat the above steps.   Fiber Chart  You should 25-30g of fiber per day and drinking 8 glasses of water to help your bowels move regularly.  In the chart below you can look up how much fiber you are getting in an average day.  If you are not getting enough fiber, you should add a fiber supplement to your diet.  Examples of this include Metamucil,  FiberCon and Citrucel.  These can be purchased at your local grocery store or pharmacy.      [email or web address]

## 2013-03-30 NOTE — Progress Notes (Signed)
Subjective:     Patient ID: Virginia Barrett, female   DOB: Aug 29, 1975, 37 y.o.   MRN: [id number]  HPI 37 year old Caucasian female comes in for followup after undergoing laparoscopic sigmoid colectomy for a history of diverticulitis. She was last seen in the office on July 25. Her surgery was July 10. She states that she is doing well. She has returned to work. She denies any fever, chills, nausea, vomiting, diarrhea. She does occasionally have some constipation. She takes MiraLAX and that occurs. She is drinking 4-6 glasses of water a day. She occasionally has some right-sided abdominal pain when she rolls on her right side at night. But it only lasts about 10 seconds.  Review of Systems     Objective:   Physical Exam BP 114/76  Pulse 93  Temp(Src) 97.9 F (36.6 C) (Temporal)  Resp 16  Ht 5\' 7"  (1.702 m)  Wt 266 lb (120.657 kg)  BMI 41.65 kg/m2  SpO2 96%  Gen: alert, NAD, non-toxic appearing Abd: soft, nontender, nondistended. Well-healed trocar sites. No cellulitis. No incisional hernia Skin: no rash, no jaundice     Assessment:     Status post laparoscopic sigmoid colectomy for diverticular disease     Plan:     Overall I think she is doing great. I have released her to full activities. We discussed the importance of having daily bowel movements. We discussed institution of a high fiber diet. We talked about fiber supplementation with Metamucil or Benefiber. She was given a high fiber diet instructions sheet. She was instructed to contact Dr. Karilyn Cota office to discuss timing of her next colonoscopy. With respect to the transient right-sided abdominal pain I told her it should resolve with time. Followup as needed or call with any additional questions  Mary Sella. Andrey Campanile, MD, FACS General, Bariatric, & Minimally Invasive Surgery [facility], [place]

## 2013-04-04 ENCOUNTER — Encounter (INDEPENDENT_AMBULATORY_CARE_PROVIDER_SITE_OTHER): Payer: Self-pay

## 2013-05-27 ENCOUNTER — Other Ambulatory Visit (HOSPITAL_COMMUNITY): Payer: Self-pay | Admitting: Pulmonary Disease

## 2013-05-27 ENCOUNTER — Ambulatory Visit (HOSPITAL_COMMUNITY)
Admission: RE | Admit: 2013-05-27 | Discharge: 2013-05-27 | Disposition: A | Discharge status: Home / Self Care | Payer: Managed Care, Other (non HMO) | Source: Ambulatory Visit | Attending: Pulmonary Disease | Admitting: Pulmonary Disease

## 2013-05-27 DIAGNOSIS — M25569 Pain in unspecified knee: Secondary | ICD-10-CM | POA: Insufficient documentation

## 2013-05-27 DIAGNOSIS — S8990XA Unspecified injury of unspecified lower leg, initial encounter: Secondary | ICD-10-CM | POA: Insufficient documentation

## 2013-05-27 DIAGNOSIS — X58XXXA Exposure to other specified factors, initial encounter: Secondary | ICD-10-CM | POA: Insufficient documentation

## 2013-05-27 DIAGNOSIS — M25561 Pain in right knee: Secondary | ICD-10-CM

## 2014-09-29 ENCOUNTER — Other Ambulatory Visit (HOSPITAL_COMMUNITY): Payer: Self-pay | Admitting: Pulmonary Disease

## 2014-09-29 DIAGNOSIS — M25561 Pain in right knee: Secondary | ICD-10-CM

## 2014-10-05 ENCOUNTER — Ambulatory Visit: Payer: Managed Care, Other (non HMO) | Admitting: Orthopedic Surgery

## 2014-10-06 ENCOUNTER — Ambulatory Visit (HOSPITAL_COMMUNITY): Payer: Managed Care, Other (non HMO)

## 2014-10-11 ENCOUNTER — Ambulatory Visit (HOSPITAL_COMMUNITY)
Admission: RE | Admit: 2014-10-11 | Discharge: 2014-10-11 | Disposition: A | Discharge status: Home / Self Care | Payer: Managed Care, Other (non HMO) | Source: Ambulatory Visit | Attending: Pulmonary Disease | Admitting: Pulmonary Disease

## 2014-10-11 DIAGNOSIS — M25561 Pain in right knee: Secondary | ICD-10-CM | POA: Diagnosis present

## 2014-11-07 ENCOUNTER — Ambulatory Visit (INDEPENDENT_AMBULATORY_CARE_PROVIDER_SITE_OTHER): Payer: Managed Care, Other (non HMO) | Admitting: Orthopedic Surgery

## 2014-11-07 ENCOUNTER — Encounter: Payer: Self-pay | Admitting: Orthopedic Surgery

## 2014-11-07 VITALS — HR 78 | Ht 67.0 in | Wt 275.0 lb

## 2014-11-07 DIAGNOSIS — M222X1 Patellofemoral disorders, right knee: Secondary | ICD-10-CM

## 2014-11-07 DIAGNOSIS — M94261 Chondromalacia, right knee: Secondary | ICD-10-CM

## 2014-11-07 NOTE — Patient Instructions (Addendum)
PHYSICAL THERAPY @ [facility] THREE TIMES A WEEK FOR SIX WEEKS FOR PATELLA FEMORAL SYNDROME

## 2014-11-07 NOTE — Progress Notes (Signed)
Patient ID: Virginia Barrett, female   DOB: [birth year], 39 y.o.   MRN: [id number]  Chief Complaint  Patient presents with  . Knee Problem    Anterior right knee pain 4 years     Virginia Barrett is a 39 y.o. female.   HPI This 39 year old female presents with a 4 year history of anterior knee pain crepitance and painful range of motion since a fall in 2012. She has significant difficulties bending squatting and climbing and descending stairs. The pain is worse and has become constant and ranges from 7-10 out of 10. She had an MRI and I have the report I basically see significant cartilage degeneration in all 3 compartments there may be a partial small vertical tear at the lateral meniscus but I think that's insignificant. She also has joint effusion Review of Systems Pertinent review of systems she reports normal neurologic symptoms no skin issues she does report joint pain and back pain swollen joints other unrelated things include fatigue.  Past Medical History  Diagnosis Date  . Diverticulitis   . Headache(784.0)     occasional   . Pneumonia     hx of walking pneumonia   . Precancerous skin lesion   . Sinus infection     diagnosed on 01/28/13     Past Surgical History  Procedure Laterality Date  . Mouth surgery      wisdom teeth removed  . Colonoscopy N/A 11/17/2012    Procedure: COLONOSCOPY;  Surgeon: [name], MD;  Location: [facility];  Service: Endoscopy;  Laterality: N/A;  1200-moved to1030 Ann to notify pt  . Laparoscopic sigmoid colectomy N/A 02/03/2013    Procedure: LAPAROSCOPIC SIGMOID COLECTOMY;  Surgeon: [name], MD;  Location: [facility]S;  Service: General;  Laterality: N/A;  . Proctoscopy  02/03/2013    Procedure: PROCTOSCOPY;  Surgeon: [name], MD;  Location: [facility];  Service: General;;    Family History  Problem Relation Age of Onset  . Colon polyps Mother   . Diabetes Mother   . COPD Mother   . Colon polyps Other   . Colon cancer Neg Hx   .  Hodgkin's lymphoma Father   . Cancer Maternal Aunt     breast  . Cancer Maternal Grandmother     breast    Social History History  Substance Use Topics  . Smoking status: Never Smoker   . Smokeless tobacco: Never Used  . Alcohol Use: Yes     Comment: rarely    Allergies  Allergen Reactions  . Cinnamon Anaphylaxis  . Sulfur Hives  . Ciprofloxacin     Rash  . Codeine Hives and Swelling  . Penicillins Hives  . Red Dye Hives    Current Outpatient Prescriptions  Medication Sig Dispense Refill  . ibuprofen (ADVIL,MOTRIN) 200 MG tablet Take 800 mg by mouth every 6 (six) hours as needed for pain.    [name] acetaminophen (TYLENOL) 500 MG tablet Take 1,000 mg by mouth every 6 (six) hours as needed for pain.    . fluticasone (FLONASE) 50 MCG/ACT nasal spray Place 2 sprays into the nose daily.    . Homeopathic Products (ZICAM COLD REMEDY PO) Take by mouth. Patient took on 01/28/13 and 01/29/13.    [name] HYDROcodone-acetaminophen (NORCO) 5-325 MG per tablet Take 1 tablet by mouth every 6 (six) hours as needed for pain. (Patient not taking: Reported on 11/07/2014) 30 tablet 0  . polyethylene glycol (MIRALAX / GLYCOLAX) packet Take  17 g by mouth daily.    . Probiotic Product (ALIGN PO) Take by mouth.     No current facility-administered medications for this visit.       Physical Exam Pulse 78, height 5\' 7"  (1.702 m), weight 275 lb (124.739 kg), last menstrual period 09/13/2014. Physical Exam The patient is well developed well nourished and well groomed. Orientation to person place and time is normal  Mood is pleasant. Ambulatory status normal no assisted devices. She has elevated BMI, Body mass index is 43.06 kg/(m^2).  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  . Chondromalacia of knee, right   . Patella-femoral syndrome, right Yes    Plan Really at this point this is a difficult situation with her age and her weight. She says her weight is down somewhat which is encouraging. She hasn't had physical therapy so I would at least do that and then offer arthroscopic debridement if no improvement with the understanding is I've told her today that she may or may not improve and some patients actually get worse after arthroscopic debridement. She is agreeable to this plan and will come back in 2 months

## 2014-11-16 ENCOUNTER — Ambulatory Visit (HOSPITAL_COMMUNITY): Payer: Managed Care, Other (non HMO) | Attending: Orthopedic Surgery | Admitting: Physical Therapy

## 2014-11-16 DIAGNOSIS — R29898 Other symptoms and signs involving the musculoskeletal system: Secondary | ICD-10-CM | POA: Diagnosis not present

## 2014-11-16 DIAGNOSIS — M25661 Stiffness of right knee, not elsewhere classified: Secondary | ICD-10-CM | POA: Insufficient documentation

## 2014-11-16 DIAGNOSIS — M25561 Pain in right knee: Secondary | ICD-10-CM

## 2014-11-16 NOTE — Patient Instructions (Addendum)
Knee Extension (Sitting)   Place __3-5__ pound weight on left ankle and straighten knee fully, lower slowly. Repeat __10__ times per set. Do __1__ sets per session. Do __2__ sessions per day.  http://orth.exer.us/732   Copyright  VHI. All rights reserved.  Strengthening: Quadriceps Set   Tighten muscles on top of thighs by pushing knees down into surface. Hold _3-5___ seconds. Repeat _10___ times per set. Do ___1_ sets per session. Do _5___ sessions per day.     http://orth.exer.us/614   Copyright  VHI. All rights reserved.  Self-Mobilization: Heel Slide (Supine)   Slide left heel toward buttocks until a gentle stretch is felt. Hold _5___ seconds. Relax. Repeat 10____ times per set. Do _1___ sets per session. Do _2___ sessions per day.  http://orth.exer.us/710   Copyright  VHI. All rights reserved.  Self-Mobilization: Knee Flexion (Prone)   Bring left heel toward buttocks as close as possible. Hold __3__ seconds. Relax. Repeat _10___ times per set. Do __1__ sets per session. Do _2___ sessions per day. Complete with 3 -5# http://orth.exer.us/596   Copyright  VHI. All rights reserved.  Strengthening: Hip Extension (Prone)   Tighten muscles on front of left thigh, then lift leg _2___ inches from surface, keeping knee locked. Repeat ___10-15_ times per set. Do ____1 sets per session. Do ____ sessions per day. Two pillows under abdomen2 http://orth.exer.us/620   Copyright  VHI. All rights reserved.  Quads / HF, Prone   Lie face down, knees together. Grasp one ankle with same-side hand. Use towel if needed to reach. Gently pull foot toward buttock. Hold _30__ seconds. Repeat _3__ times per session. Do _2__ sessions per day.  Copyright  VHI. All rights reserved.

## 2014-11-16 NOTE — Therapy (Signed)
[place], [place], [address] Phone: [phone]   Fax:  [phone]  Physical Therapy Evaluation  Patient Details  Name: Virginia Barrett MRN: [id number] Date of Birth: 08-26-1975 Referring Provider:  Carole Civil, MD  Encounter Date: 11/16/2014      PT End of Session - 11/16/14 1114    Visit Number 1   Number of Visits 12   Authorization Type Aetna   PT Start Time 1020   PT Stop Time 1105   PT Time Calculation (min) 45 min      Past Medical History  Diagnosis Date  . Diverticulitis   . Headache(784.0)     occasional   . Pneumonia     hx of walking pneumonia   . Precancerous skin lesion   . Sinus infection     diagnosed on 01/28/13     Past Surgical History  Procedure Laterality Date  . Mouth surgery      wisdom teeth removed  . Colonoscopy N/A 11/17/2012    Procedure: COLONOSCOPY;  Surgeon: [name], MD;  [facility];  Service: Endoscopy;  Laterality: N/A;  1200-moved to1030 Ann to notify pt  . Laparoscopic sigmoid colectomy N/A 02/03/2013    Procedure: LAPAROSCOPIC SIGMOID COLECTOMY;  Surgeon: [name], MD;  Location: [facility];  Service: General;  Laterality: N/A;  . Proctoscopy  02/03/2013    Procedure: PROCTOSCOPY;  Surgeon: [name], MD;  Location: [facility];  Service: General;;    There were no vitals filed for this visit.  Visit Diagnosis:  Right anterior knee pain  Right leg weakness  Stiffness of joint, lower leg, right      Subjective Assessment - 11/16/14 1032    Subjective Pt states she initially injured her Rt knee four years ago while hiking.  The pain has progressively increased to a point where she is having problems going up and down steps and squatting thererfore she opted to seek medical advice.    How long can you sit comfortably? knee is stiff after sitting for three to four hours   How long can you stand comfortably? no problem   How long can  you walk comfortably? no problem   Patient Stated Goals to be able to squat and go up and down steps without pain.  Knee to not buckle, (currently 1 to 2 times a week)   Currently in Pain? No/denies  highest is 8/10   Pain Score 0-No pain   Pain Location Knee   Pain Orientation Right   Pain Descriptors / Indicators Aching;Sharp   Pain Onset More than a month ago   Pain Frequency Intermittent            [facility] PT Assessment - 11/16/14 0001    Assessment   Medical Diagnosis Rt knee pain    Onset Date 09/17/14  When pain became to the point that she wanted to see MD   Next MD Visit 01/09/2015   Prior Therapy nonne   Precautions   Precautions None   Restrictions   Weight Bearing Restrictions No   Balance Screen   Has the patient fallen in the past 6 months No   Has the patient had a decrease in activity level because of a fear of falling?  No   Is the patient reluctant to leave their home because of a fear of falling?  No   Prior Function   Level of Independence  Independent with basic ADLs   Vocation Full time employment   Vocation Requirements on feet up and down steps    Leisure hiking; biking    Cognition   Overall Cognitive Status Within Functional Limits for tasks assessed   Observation/Other Assessments   Focus on Therapeutic Outcomes (FOTO)  53   AROM   Right Knee Extension 7   Right Knee Flexion 120   Strength   Right Hip Flexion 5/5   Right Hip Extension 3+/5   Right Hip ABduction 5/5   Right Hip ADduction 5/5   Right Knee Flexion 4/5   Right Knee Extension 4/5   Left Ankle Dorsiflexion 5/5   Flexibility   Soft Tissue Assessment /Muscle Length yes   Hamstrings 170   Quadriceps Lt heel to buttock 5.5 " ; RT 7.25"                   [facility] Adult PT Treatment/Exercise - 11/16/14 0001    Exercises   Exercises Knee/Hip   Knee/Hip Exercises: Stretches   Quad Stretch 2 reps;30 seconds   Knee/Hip Exercises: Standing   SLS Lt 60 seconds;Rt 60     Knee/Hip Exercises: Seated   Long Arc Quad 10 reps   Long Arc Quad Weight 3 lbs.   Knee/Hip Exercises: Supine   Quad Sets Right;10 reps   Heel Slides 10 reps   Knee/Hip Exercises: Prone   Hamstring Curl 10 reps   Hamstring Curl Limitations 3#                PT Education - 11/16/14 1114    Education provided Yes   Education Details HEP   Person(s) Educated Patient   Methods Explanation;Handout   Comprehension Verbalized understanding;Returned demonstration          PT Short Term Goals - 11/16/14 1122    PT SHORT TERM GOAL #1   Title I HEP   Time 1   Period Weeks   PT SHORT TERM GOAL #2   Title Pain to be no greater than a 4/10 at any point   Time 3   Period Weeks   PT SHORT TERM GOAL #3   Title Pt to be able to go up steps without pain   Time 3   Period Weeks           PT Long Term Goals - 11/16/14 1123    PT LONG TERM GOAL #1   Title I in advance HEP   Time 6   Period Weeks   PT LONG TERM GOAL #2   Title Pt pain to be no greater than a 1 at any point   Time 6   Period Weeks   PT LONG TERM GOAL #3   Title Pt to be able to come down steps without pain   Time 6   PT LONG TERM GOAL #4   Title Pt ROM to be to 130 degrees to be able to squt without increased pain.   Time 6   Period Weeks   PT LONG TERM GOAL #5   Title Strength to be wnl to be able to return from squt position without pain   Time 6   Period Weeks   Additional Long Term Goals   Additional Long Term Goals Yes   PT LONG TERM GOAL #6   Title Pt to return to biking and hiking for healthier lifestyle.   Time 6   Period Weeks  Plan - 11/16/14 1117    Clinical Impression Statement Virginia Barrett is a 39 yo female who has had progressing pain in her Rt knee for several years.  The pain has come to a point that it is stopping her from completing her recreational activities and is interferring with stair climbing and squatting therefore she has sought medical advise.   Examination demonstrates decreased quadricep length and decreased knee and hip extension strength.  Virginia Barrett will benefit from skilled physical therapy to address theses issues and maximize her level of function.    Pt will benefit from skilled therapeutic intervention in order to improve on the following deficits Decreased activity tolerance;Pain;Increased edema;Decreased range of motion;Decreased strength   Rehab Potential Good   PT Frequency 2x / week   PT Duration 6 weeks   PT Treatment/Interventions Therapeutic exercise;Therapeutic activities;Functional mobility training;Stair training;Patient/family education   PT Next Visit Plan begin standing and supine terminal extension; SAQ with ER; lateral and forward step ups, mini squat (one legged if able); vector stances with 10 to 15 second holds.  Pt will need pictures of added exercises to complete at home.    PT Home Exercise Plan given   Consulted and Agree with Plan of Care Patient         Problem List There are no active problems to display for this patient. Rayetta Humphrey, PT CLT [phone] 11/16/2014, 11:27 AM  [facility] [address] [place], [place], [address] Phone: [phone]   Fax:  [phone]

## 2014-11-28 ENCOUNTER — Ambulatory Visit (HOSPITAL_COMMUNITY): Payer: Managed Care, Other (non HMO) | Attending: Pulmonary Disease | Admitting: Physical Therapy

## 2014-11-28 DIAGNOSIS — R29898 Other symptoms and signs involving the musculoskeletal system: Secondary | ICD-10-CM | POA: Diagnosis not present

## 2014-11-28 DIAGNOSIS — M25561 Pain in right knee: Secondary | ICD-10-CM

## 2014-11-28 DIAGNOSIS — M25661 Stiffness of right knee, not elsewhere classified: Secondary | ICD-10-CM | POA: Diagnosis not present

## 2014-11-28 NOTE — Therapy (Signed)
[place], [place], [address] Phone: [phone]   Fax:  [phone]  Physical Therapy Treatment  Patient Details  Name: Virginia Barrett MRN: [id number] Date of Birth: June 22, 1976 Referring Provider:  Sinda Du, MD  Encounter Date: 11/28/2014      PT End of Session - 11/28/14 1204    Visit Number 2   Number of Visits 12   Authorization Type Aetna   PT Start Time 1100   PT Stop Time 1200   PT Time Calculation (min) 60 min      Past Medical History  Diagnosis Date  . Diverticulitis   . Headache(784.0)     occasional   . Pneumonia     hx of walking pneumonia   . Precancerous skin lesion   . Sinus infection     diagnosed on 01/28/13     Past Surgical History  Procedure Laterality Date  . Mouth surgery      wisdom teeth removed  . Colonoscopy N/A 11/17/2012    Procedure: COLONOSCOPY;  Surgeon: [name], MD;  [facility];  Service: Endoscopy;  Laterality: N/A;  1200-moved to1030 Ann to notify pt  . Laparoscopic sigmoid colectomy N/A 02/03/2013    Procedure: LAPAROSCOPIC SIGMOID COLECTOMY;  Surgeon: [name], MD;  [facility];  Service: General;  Laterality: N/A;  . Proctoscopy  02/03/2013    Procedure: PROCTOSCOPY;  Surgeon: [name], MD;  [facility];  Service: General;;    There were no vitals filed for this visit.  Visit Diagnosis:  Right anterior knee pain  Right leg weakness  Stiffness of joint, lower leg, right      Subjective Assessment - 11/28/14 1137    Subjective Pt states she was completing quad sets and felt a pop and burn in her knee.  Reports she put ice on it.  States it now feels tight around superior knee.     Currently in Pain? No/denies                         [facility] Adult PT Treatment/Exercise - 11/28/14 1121    Knee/Hip Exercises: Supine   Quad Sets Right;10 reps   Short Arc Quad Sets Right;10 reps   Short Arc Quad  Sets Limitations 10X neutral/ 10X ER   Heel Slides 10 reps   Straight Leg Raises Right;10 reps   Straight Leg Raise with External Rotation Right;10 reps   Knee/Hip Exercises: Prone   Hamstring Curl 10 reps   Hamstring Curl Limitations 5#   Hip Extension Both;10 reps   Hip Extension Limitations with 2 pillows under hips                PT Education - 11/28/14 1203    Education provided Yes   Education Details form with HEP (mainly hip extension), additional exercises to add to HEP   Person(s) Educated Patient   Methods Explanation;Handout   Comprehension Verbalized understanding;Returned demonstration          PT Short Term Goals - 11/16/14 1122    PT SHORT TERM GOAL #1   Title I HEP   Time 1   Period Weeks   PT SHORT TERM GOAL #2   Title Pain to be no greater than a 4/10 at any point   Time 3   Period Weeks   PT SHORT TERM GOAL #3   Title Pt to be  able to go up steps without pain   Time 3   Period Weeks           PT Long Term Goals - 11/16/14 1123    PT LONG TERM GOAL #1   Title I in advance HEP   Time 6   Period Weeks   PT LONG TERM GOAL #2   Title Pt pain to be no greater than a 1 at any point   Time 6   Period Weeks   PT LONG TERM GOAL #3   Title Pt to be able to come down steps without pain   Time 6   PT LONG TERM GOAL #4   Title Pt ROM to be to 130 degrees to be able to squt without increased pain.   Time 6   Period Weeks   PT LONG TERM GOAL #5   Title Strength to be wnl to be able to return from squt position without pain   Time 6   Period Weeks   Additional Long Term Goals   Additional Long Term Goals Yes   PT LONG TERM GOAL #6   Title Pt to return to biking and hiking for healthier lifestyle.   Time 6   Period Weeks               Plan - 11/28/14 1204    Clinical Impression Statement Patient concerned with form and back strain when performing hip extension.  Instructed to only lift 1/2 ROM with focus on glute muscle and  complete slowly.  Pt was able to complete without back discomfort.  Pt was able to complete all other established therex with minimal cues and good form.  No c/o with any exericise, only weakness noted in quad with added SLR and SAQ.  Pt instructed to complete in neutral and ER.  Pt given written instructions to add to HEP.    PT Next Visit Plan begin standing terminal extension, lateral and forward step ups, mini squat (one legged if able); vector stances with 10 to 15 second holds.    PT Home Exercise Plan given for SAQ and SLR        Problem List There are no active problems to display for this patient.   Teena Irani, PTA/CLT [phone] 11/28/2014, 12:08 PM  [facility] [address] [place], [place], [address] Phone: [phone]   Fax:  [phone]

## 2014-11-28 NOTE — Patient Instructions (Signed)
Straight Leg Raise   Tighten stomach and slowly raise locked right leg __10__ inches from floor. Repeat ___10_ times per set. Do ___1_ sets per session. Do __2__ sessions per day.  http://orth.exer.us/1103   Copyright  VHI. All rights reserved.  Short Arc [employer] a large can or rolled towel under leg. Straighten knee and leg. Hold __5__ seconds. Repeat with other leg. Repeat __10_ times. Do __2__ sessions per day.  http://gt2.exer.us/366   Copyright  VHI. All rights reserved.

## 2014-11-30 ENCOUNTER — Ambulatory Visit (HOSPITAL_COMMUNITY): Payer: Managed Care, Other (non HMO) | Admitting: Physical Therapy

## 2014-11-30 DIAGNOSIS — R29898 Other symptoms and signs involving the musculoskeletal system: Secondary | ICD-10-CM

## 2014-11-30 DIAGNOSIS — M25661 Stiffness of right knee, not elsewhere classified: Secondary | ICD-10-CM

## 2014-11-30 DIAGNOSIS — M25561 Pain in right knee: Secondary | ICD-10-CM

## 2014-11-30 NOTE — Patient Instructions (Signed)
Step-Down / Step-Up   Stand on stair step or _4___ inch stool. Slowly bend left leg, lowering other foot to floor. Return by straightening front leg. Repeat _10___ times per set. Do _1___ sets per session. Do ___2_ sessions per day.  http://orth.exer.us/684   Copyright  VHI. All rights reserved.  Hip Hike   Stand on step, left  leg off step, knee straight. Raise unsupported hip, keeping knee straight. Repeat _10___ times per set. Do _1___ sets per session. Do _2___ sessions per day.  http://orth.exer.us/692   Copyright  VHI. All rights reserved.  Balance: Three-Way Leg Swing   Stand on right  foot, hands on hips. Reach other foot forward ___10_ seconds, sideways _10___ seconds back _10___ seconds . [name] Relax. Repeat _5__ times per set. Do ____ sets per session. Do ____1sessions per day.  http://orth.exer.us/86   C Copyright  VHI. All rights reserved.

## 2014-11-30 NOTE — Therapy (Signed)
[place], [place], [address] Phone: [phone]   Fax:  [phone]  Physical Therapy Treatment  Patient Details  Name: Virginia Barrett MRN: [id number] Date of Birth: 08/28/1975 Referring Provider:  Sinda Du, MD  Encounter Date: 11/30/2014      PT End of Session - 11/30/14 1611    Visit Number 3   Number of Visits 12   Authorization Type Aetna   PT Start Time 1430   PT Stop Time 1515   PT Time Calculation (min) 45 min      Past Medical History  Diagnosis Date  . Diverticulitis   . Headache(784.0)     occasional   . Pneumonia     hx of walking pneumonia   . Precancerous skin lesion   . Sinus infection     diagnosed on 01/28/13     Past Surgical History  Procedure Laterality Date  . Mouth surgery      wisdom teeth removed  . Colonoscopy N/A 11/17/2012    Procedure: COLONOSCOPY;  Surgeon: [name], MD;  [facility];  Service: Endoscopy;  Laterality: N/A;  1200-moved to1030 Ann to notify pt  . Laparoscopic sigmoid colectomy N/A 02/03/2013    Procedure: LAPAROSCOPIC SIGMOID COLECTOMY;  Surgeon: [name], MD;  [facility];  Service: General;  Laterality: N/A;  . Proctoscopy  02/03/2013    Procedure: PROCTOSCOPY;  Surgeon: [name], MD;  [facility];  Service: General;;    There were no vitals filed for this visit.  Visit Diagnosis:  Right anterior knee pain  Right leg weakness  Stiffness of joint, lower leg, right      Subjective Assessment - 11/30/14 1436    Subjective Ms. Liggins states going up and down steps are still difficult; she [place] the [place] and going up and down steps all day  She has been doing her exercises .    Currently in Pain? Yes   Pain Score 1    Pain Location Knee   Pain Orientation Right   Pain Descriptors / Indicators Aching            [facility] Adult PT Treatment/Exercise - 11/30/14 0001    Knee/Hip Exercises: Stretches   Quad Stretch 2 reps;60 seconds   Knee/Hip Exercises: Standing   Lateral Step Up Right;10 reps;Hand Hold: 0;Step Height: 2"   Forward Step Up Right;15 reps;Hand Hold: 0;Step Height: 2"   Step Down Right;10 reps;Hand Hold: 0;Step Height: 2"   Functional Squat Limitations minisquat Rt only x 10    SLS with Vectors 10 sec x 5    Knee/Hip Exercises: Prone   Hamstring Curl 10 reps   Hamstring Curl Limitations 5# then 10 more reps with 8#   Hip Extension Right;10 reps   Hip Extension Limitations 5# then 8 # pillow under hips              PT Education - 11/30/14 1516    Education provided Yes   Education Details new exercises given to pt for vector and step ups    Person(s) Educated Patient   Methods Explanation;Demonstration;Handout   Comprehension Verbalized understanding;Returned demonstration          PT Short Term Goals - 11/16/14 1122    PT SHORT TERM GOAL #1   Title I HEP   Time 1   Period Weeks   PT SHORT TERM GOAL #2   Title Pain  to be no greater than a 4/10 at any point   Time 3   Period Weeks   PT SHORT TERM GOAL #3   Title Pt to be able to go up steps without pain   Time 3   Period Weeks           PT Long Term Goals - 11/16/14 1123    PT LONG TERM GOAL #1   Title I in advance HEP   Time 6   Period Weeks   PT LONG TERM GOAL #2   Title Pt pain to be no greater than a 1 at any point   Time 6   Period Weeks   PT LONG TERM GOAL #3   Title Pt to be able to come down steps without pain   Time 6   PT LONG TERM GOAL #4   Title Pt ROM to be to 130 degrees to be able to squt without increased pain.   Time 6   Period Weeks   PT LONG TERM GOAL #5   Title Strength to be wnl to be able to return from squt position without pain   Time 6   Period Weeks   Additional Long Term Goals   Additional Long Term Goals Yes   PT LONG TERM GOAL #6   Title Pt to return to biking and hiking for healthier lifestyle.   Time 6   Period Weeks            Plan -  11/30/14 1612    Clinical Impression Statement Pt states her knee feels much better than it did.  Pt added new exercises with therapist faciilatating for proper form.  Pt given new HEP.   Pt demonstrates improved control of quadricep mm.    PT Next Visit Plan increase vector stance to 15 seconds.  Rt heel lift as well as wall squats         Problem List There are no active problems to display for this patient. Rayetta Humphrey, PT CLT [phone] 11/30/2014, 4:15 PM  [facility] [address] [place], [place], [address] Phone: [phone]   Fax:  [phone]

## 2014-12-04 ENCOUNTER — Encounter (HOSPITAL_COMMUNITY): Payer: Managed Care, Other (non HMO) | Admitting: Physical Therapy

## 2014-12-05 ENCOUNTER — Ambulatory Visit (HOSPITAL_COMMUNITY): Payer: Managed Care, Other (non HMO) | Admitting: Physical Therapy

## 2014-12-05 DIAGNOSIS — R29898 Other symptoms and signs involving the musculoskeletal system: Secondary | ICD-10-CM

## 2014-12-05 DIAGNOSIS — M25561 Pain in right knee: Secondary | ICD-10-CM | POA: Diagnosis not present

## 2014-12-05 DIAGNOSIS — M25661 Stiffness of right knee, not elsewhere classified: Secondary | ICD-10-CM

## 2014-12-05 NOTE — Therapy (Signed)
[place], [place], [address] Phone: [phone]   Fax:  [phone]  Physical Therapy Treatment  Patient Details  Name: Virginia Barrett MRN: [id number] Date of Birth: 06-10-1976 Referring Provider:  Sinda Du, MD  Encounter Date: 12/05/2014      PT End of Session - 12/05/14 1056    Visit Number 4   Number of Visits 12   Authorization Type Aetna   PT Start Time 3662   PT Stop Time 1102   PT Time Calculation (min) 39 min      Past Medical History  Diagnosis Date  . Diverticulitis   . Headache(784.0)     occasional   . Pneumonia     hx of walking pneumonia   . Precancerous skin lesion   . Sinus infection     diagnosed on 01/28/13     Past Surgical History  Procedure Laterality Date  . Mouth surgery      wisdom teeth removed  . Colonoscopy N/A 11/17/2012    Procedure: COLONOSCOPY;  Surgeon: [name], MD;  [facility];  Service: Endoscopy;  Laterality: N/A;  1200-moved to1030 Ann to notify pt  . Laparoscopic sigmoid colectomy N/A 02/03/2013    Procedure: LAPAROSCOPIC SIGMOID COLECTOMY;  Surgeon: [name], MD;  [facility];  Service: General;  Laterality: N/A;  . Proctoscopy  02/03/2013    Procedure: PROCTOSCOPY;  Surgeon: [name], MD;  [facility];  Service: General;;    There were no vitals filed for this visit.  Visit Diagnosis:  Right anterior knee pain  Right leg weakness  Stiffness of joint, lower leg, right      Subjective Assessment - 12/05/14 1023    Currently in Pain? Yes   Pain Score 2    Pain Location Knee   Pain Orientation Right   Pain Descriptors / Indicators Aching            [facility] Adult PT Treatment/Exercise - 12/05/14 0001    Knee/Hip Exercises: Stretches   Active Hamstring Stretch 3 reps;30 seconds   Quad Stretch 2 reps;60 seconds   Knee/Hip Exercises: Standing   Heel Raises 10 reps   Heel Raises Limitations Rt only    Forward Lunges Right;10 reps   Lateral Step Up Right;10 reps;Step Height: 4"   Forward Step Up Right;10 reps;Step Height: 4"   Step Down Right;10 reps;Step Height: 4"   Functional Squat Limitations minisquat Rt only x 10    Wall Squat 10 reps   Rocker Board 2 minutes   SLS with Vectors 15 second vector x 3    Knee/Hip Exercises: Prone   Hamstring Curl 10 reps   Hamstring Curl Limitations 7.5#/10#   Hip Extension Strengthening;Right;10 reps   Hip Extension Limitations 7.5#/ 10#                PT Education - 12/05/14 1055    Education provided Yes   Education Details pushing from heel for wall squat and return of lunges    Person(s) Educated Patient   Methods Explanation   Comprehension Returned demonstration          PT Short Term Goals - 11/16/14 1122    PT SHORT TERM GOAL #1   Title I HEP   Time 1   Period Weeks   PT SHORT TERM GOAL #2   Title Pain to be no greater than a 4/10 at any point  Time 3   Period Weeks   PT SHORT TERM GOAL #3   Title Pt to be able to go up steps without pain   Time 3   Period Weeks           PT Long Term Goals - 11/16/14 1123    PT LONG TERM GOAL #1   Title I in advance HEP   Time 6   Period Weeks   PT LONG TERM GOAL #2   Title Pt pain to be no greater than a 1 at any point   Time 6   Period Weeks   PT LONG TERM GOAL #3   Title Pt to be able to come down steps without pain   Time 6   PT LONG TERM GOAL #4   Title Pt ROM to be to 130 degrees to be able to squt without increased pain.   Time 6   Period Weeks   PT LONG TERM GOAL #5   Title Strength to be wnl to be able to return from squt position without pain   Time 6   Period Weeks   Additional Long Term Goals   Additional Long Term Goals Yes   PT LONG TERM GOAL #6   Title Pt to return to biking and hiking for healthier lifestyle.   Time 6   Period Weeks               Plan - 12/05/14 1056    Clinical Impression Statement Pt able to advance to 4" step  without increased pain today.  Add  wall squats and lunges to program.  Exercises facilitated by therapist for proper technique.  Pt continues to improve in functional use of knee.     PT Next Visit Plan Begin side lunges and step up with oppostie arm raise for increased power.         Problem List There are no active problems to display for this patient. Rayetta Humphrey, PT CLT [phone] 12/05/2014, 11:05 AM  [facility] [place], [place], [address] Phone: [phone]   Fax:  [phone]

## 2014-12-05 NOTE — Patient Instructions (Signed)
Wall Slide   Keep head, shoulders, and back against wall, with feet out in front and slightly wider than shoulder width. Slowly lower buttocks by sliding down wall until thighs are parallel to floor. Keep back flat. Repeat 10-15____ times per set. Do ___1_ sets per session. Do _2___ sessions per day.  http://orth.exer.us/152   Copyright  VHI. All rights reserved.  Forward Lunge   Standing with feet shoulder width apart and stomach tight, step forward with right leg. Repeat _10-15___ times per set. Do _1___ sets per session. Do __2__ sessions per day.  http://orth.exer.us/1146   Copyright  VHI. All rights reserved.

## 2014-12-06 ENCOUNTER — Ambulatory Visit (HOSPITAL_COMMUNITY): Payer: Managed Care, Other (non HMO) | Admitting: Physical Therapy

## 2014-12-06 DIAGNOSIS — M25561 Pain in right knee: Secondary | ICD-10-CM

## 2014-12-06 DIAGNOSIS — M25661 Stiffness of right knee, not elsewhere classified: Secondary | ICD-10-CM

## 2014-12-06 DIAGNOSIS — R29898 Other symptoms and signs involving the musculoskeletal system: Secondary | ICD-10-CM

## 2014-12-06 NOTE — Therapy (Signed)
[place], [place], [address] Phone: [phone]   Fax:  [phone]  Physical Therapy Treatment  Patient Details  Name: Virginia Barrett MRN: [id number] Date of Birth: July 20, 1976 Referring Provider:  Sinda Du, MD  Encounter Date: 12/06/2014      PT End of Session - 12/06/14 1106    Visit Number 5   Number of Visits 12   Authorization Type [employer]   PT Start Time 1022   PT Stop Time 1110   PT Time Calculation (min) 48 min   Activity Tolerance Patient tolerated treatment well   Behavior During Therapy [facility] for tasks assessed/performed      Past Medical History  Diagnosis Date  . Diverticulitis   . Headache(784.0)     occasional   . Pneumonia     hx of walking pneumonia   . Precancerous skin lesion   . Sinus infection     diagnosed on 01/28/13     Past Surgical History  Procedure Laterality Date  . Mouth surgery      wisdom teeth removed  . Colonoscopy N/A 11/17/2012    Procedure: COLONOSCOPY;  Surgeon: [name], MD;  [facility];  Service: Endoscopy;  Laterality: N/A;  1200-moved to1030 Ann to notify pt  . Laparoscopic sigmoid colectomy N/A 02/03/2013    Procedure: LAPAROSCOPIC SIGMOID COLECTOMY;  Surgeon: [name], MD;  Location: [facility];  Service: General;  Laterality: N/A;  . Proctoscopy  02/03/2013    Procedure: PROCTOSCOPY;  Surgeon: [name], MD;  Location: [facility];  Service: General;;    There were no vitals filed for this visit.  Visit Diagnosis:  Right anterior knee pain  Right leg weakness  Stiffness of joint, lower leg, right      Subjective Assessment - 12/06/14 1024    Subjective Pt states her legs began quivering after the first set of exercises yesterday after her session.  PT reports currently without pain.     Currently in Pain? No/denies                         [facility] Adult PT Treatment/Exercise - 12/06/14 1025    Knee/Hip  Exercises: Stretches   Active Hamstring Stretch 3 reps;30 seconds   Active Hamstring Stretch Limitations 14" box bilateral   Quad Stretch 3 reps;30 seconds   Quad Stretch Limitations prone with rope   Gastroc Stretch 3 reps;30 seconds   Gastroc Stretch Limitations slant board   Knee/Hip Exercises: Standing   Heel Raises 10 reps;2 sets   Heel Raises Limitations Rt only    Forward Lunges Both;10 reps   Forward Lunges Limitations onto floor 1 HHA   Side Lunges Both;10 reps   Side Lunges Limitations Rt onto 4" step, Lt onto floor no HHA   Lateral Step Up Right;Step Height: 4";15 reps;Hand Hold: 0   Forward Step Up Right;Step Height: 4";15 reps;Hand Hold: 0   Forward Step Up Limitations 1 set power ups 4"    Step Down Right;Step Height: 4";Hand Hold: 1;15 reps   Wall Squat 10 reps   Rocker Board 2 minutes   Rocker Board Limitations Rt/Lt no HHA   SLS with Vectors 15 second vector x 3    Knee/Hip Exercises: Prone   Hamstring Curl 10 reps   Hamstring Curl Limitations 7.5#/10#   Hip Extension Strengthening;Right;10 reps   Hip Extension Limitations 7.5#/ 10#  PT Education - 12/05/14 1055    Education provided Yes   Education Details pushing from heel for wall squat and return of lunges    Person(s) Educated Patient   Methods Explanation   Comprehension Returned demonstration          PT Short Term Goals - 11/16/14 1122    PT SHORT TERM GOAL #1   Title I HEP   Time 1   Period Weeks   PT SHORT TERM GOAL #2   Title Pain to be no greater than a 4/10 at any point   Time 3   Period Weeks   PT SHORT TERM GOAL #3   Title Pt to be able to go up steps without pain   Time 3   Period Weeks           PT Long Term Goals - 11/16/14 1123    PT LONG TERM GOAL #1   Title I in advance HEP   Time 6   Period Weeks   PT LONG TERM GOAL #2   Title Pt pain to be no greater than a 1 at any point   Time 6   Period Weeks   PT LONG TERM GOAL #3   Title Pt to be  able to come down steps without pain   Time 6   PT LONG TERM GOAL #4   Title Pt ROM to be to 130 degrees to be able to squt without increased pain.   Time 6   Period Weeks   PT LONG TERM GOAL #5   Title Strength to be wnl to be able to return from squt position without pain   Time 6   Period Weeks   Additional Long Term Goals   Additional Long Term Goals Yes   PT LONG TERM GOAL #6   Title Pt to return to biking and hiking for healthier lifestyle.   Time 6   Period Weeks               Plan - 12/06/14 1106    Clinical Impression Statement Progressed to power ups onto 4" step and able to complete all others without UE assist.  Added lateral step ups with patient having to use 4" riser on Rt due to discomfort.  Prone hip extension continues to be difficulty for patient due to weakness.  Pt encouraged to continue her daily 30 minutes of exercise.  Pt with good form completing therex.    PT Next Visit Plan continue to progress LE stength in pain free ROM.  Progress to agility drills when able.         Teena Irani, PTA/CLT [phone]  12/06/2014, 11:21 AM  [place] [address] [place], [place], [address] Phone: [phone]   Fax:  [phone]

## 2014-12-11 ENCOUNTER — Ambulatory Visit (HOSPITAL_COMMUNITY): Payer: Managed Care, Other (non HMO)

## 2014-12-11 DIAGNOSIS — R29898 Other symptoms and signs involving the musculoskeletal system: Secondary | ICD-10-CM

## 2014-12-11 DIAGNOSIS — M25561 Pain in right knee: Secondary | ICD-10-CM

## 2014-12-11 DIAGNOSIS — M25661 Stiffness of right knee, not elsewhere classified: Secondary | ICD-10-CM

## 2014-12-11 NOTE — Therapy (Signed)
[place], [place], [address] Phone: [phone]   Fax:  [phone]  Physical Therapy Treatment  Patient Details  Name: Virginia Barrett MRN: [id number] Date of Birth: January 19, 1976 Referring Provider:  Carole Civil, MD  Encounter Date: 12/11/2014      PT End of Session - 12/11/14 1025    Visit Number 6   Number of Visits 12   Date for PT Re-Evaluation 12/14/14   Authorization Type [employer]et[employer]   PT Start Time 1022   PT Stop Time 1103   PT Time Calculation (min) 41 min   Activity Tolerance Patient tolerated treatment well   Behavior During Therapy [facility] for tasks assessed/performed      Past Medical History  Diagnosis Date  . Diverticulitis   . Headache(784.0)     occasional   . Pneumonia     hx of walking pneumonia   . Precancerous skin lesion   . Sinus infection     diagnosed on 01/28/13     Past Surgical History  Procedure Laterality Date  . Mouth surgery      wisdom teeth removed  . Colonoscopy N/A 11/17/2012    Procedure: COLONOSCOPY;  Surgeon: [name], MD;  [facility];  Service: Endoscopy;  Laterality: N/A;  1200-moved to1030 Ann to notify pt  . Laparoscopic sigmoid colectomy N/A 02/03/2013    Procedure: LAPAROSCOPIC SIGMOID COLECTOMY;  Surgeon: [name], MD;  [facility];  Service: General;  Laterality: N/A;  . Proctoscopy  02/03/2013    Procedure: PROCTOSCOPY;  Surgeon: [name], MD;  [facility];  Service: General;;    There were no vitals filed for this visit.  Visit Diagnosis:  Right anterior knee pain  Right leg weakness  Stiffness of joint, lower leg, right      Subjective Assessment - 12/11/14 1024    Subjective Pt stated currently pain free.  Most difficutly with lunges and lateral movements.     Currently in Pain? No/denies            [facility] PT Assessment - 12/11/14 0001    Assessment   Medical Diagnosis Rt knee pain    Onset Date  09/17/14   Next MD Visit Aline Brochure 01/09/2015   Prior Therapy none           [facility] Adult PT Treatment/Exercise - 12/11/14 0001    Knee/Hip Exercises: Stretches   Active Hamstring Stretch 3 reps;30 seconds   Active Hamstring Stretch Limitations 14" box bilateral   Quad Stretch 3 reps;30 seconds   Quad Stretch Limitations prone with rope   Gastroc Stretch 3 reps;30 seconds   Gastroc Stretch Limitations slant board   Knee/Hip Exercises: Geologist, engineering ladder 8 minutes   Knee/Hip Exercises: Standing   Forward Lunges Both;15 reps   Forward Lunges Limitations onto floor 1 HHA   Side Lunges Both;15 reps   Lateral Step Up Both;15 reps;Hand Hold: 1;Step Height: 4"   Forward Step Up Right;15 reps;Hand Hold: 1;Step Height: 6"   Forward Step Up Limitations 1 set power ups 6"    Step Down Right;Step Height: 4";Hand Hold: 1;15 reps   Wall Squat 15 reps;5 seconds   Knee/Hip Exercises: Prone   Hamstring Curl 2 sets;10 reps   Hamstring Curl Limitations 10#   Hip Extension Right;2 sets;10 reps   Hip Extension Limitations 10#  PT Short Term Goals - 12/11/14 1055    PT SHORT TERM GOAL #1   Title I HEP   Status On-going   PT SHORT TERM GOAL #2   Title Pain to be no greater than a 4/10 at any point   Status On-going   PT SHORT TERM GOAL #3   Title Pt to be able to go up steps without pain   Status On-going           PT Long Term Goals - 12/11/14 1055    PT LONG TERM GOAL #1   Title I in advance HEP   PT LONG TERM GOAL #2   Title Pt pain to be no greater than a 1 at any point   Status On-going   PT LONG TERM GOAL #3   Title Pt to be able to come down steps without pain   Status On-going   PT LONG TERM GOAL #4   Title Pt ROM to be to 130 degrees to be able to squt without increased pain.   Status On-going   PT LONG TERM GOAL #5   Title Strength to be wnl to be able to return from squt position without pain   Status On-going    PT LONG TERM GOAL #6   Title Pt to return to biking and hiking for healthier lifestyle.               Plan - 12/11/14 1135    Clinical Impression Statement Functional strength is progressing well towards goals.  Increased step height with power ups and ability to complete lateral step up with decreased c/o discomfort.  Pt continues to display weak gluteal musculature, continued with prone SLR.  Began agility ladder to improve coordination. No reports of pain through session.    PT Next Visit Plan continue to progress LE stength in pain free ROM. Continue with agility ladder activities to improve sequencing and coordination.          Problem List There are no active problems to display for this patient.  [address], [place]; [place] [address] [phone]  Aldona Lento 12/11/2014, 12:05 PM  [place] [address] [place], [place], [address] Phone: [phone]   Fax:  [phone]

## 2014-12-13 ENCOUNTER — Encounter (HOSPITAL_COMMUNITY): Payer: Managed Care, Other (non HMO) | Admitting: Physical Therapy

## 2014-12-19 ENCOUNTER — Ambulatory Visit (HOSPITAL_COMMUNITY): Payer: Managed Care, Other (non HMO) | Admitting: Physical Therapy

## 2014-12-19 DIAGNOSIS — R29898 Other symptoms and signs involving the musculoskeletal system: Secondary | ICD-10-CM

## 2014-12-19 DIAGNOSIS — M25561 Pain in right knee: Secondary | ICD-10-CM

## 2014-12-19 DIAGNOSIS — M25661 Stiffness of right knee, not elsewhere classified: Secondary | ICD-10-CM

## 2014-12-19 NOTE — Therapy (Signed)
[place], [place], [address] Phone: [phone]   Fax:  [phone]  Physical Therapy Treatment  Patient Details  Name: LONIA ROANE MRN: [id number] Date of Birth: Nov 18, 1975 Referring Provider:  Carole Civil, MD  Encounter Date: 12/19/2014      PT End of Session - 12/19/14 1053    Visit Number 7   Number of Visits 12   Date for PT Re-Evaluation 12/14/14   PT Start Time 1024   PT Stop Time 1050   PT Time Calculation (min) 26 min   Activity Tolerance Patient tolerated treatment well      Past Medical History  Diagnosis Date  . Diverticulitis   . Headache(784.0)     occasional   . Pneumonia     hx of walking pneumonia   . Precancerous skin lesion   . Sinus infection     diagnosed on 01/28/13     Past Surgical History  Procedure Laterality Date  . Mouth surgery      wisdom teeth removed  . Colonoscopy N/A 11/17/2012    Procedure: COLONOSCOPY;  Surgeon: [name], MD;  [facility];  Service: Endoscopy;  Laterality: N/A;  1200-moved to1030 Ann to notify pt  . Laparoscopic sigmoid colectomy N/A 02/03/2013    Procedure: LAPAROSCOPIC SIGMOID COLECTOMY;  Surgeon: [name], MD;  [facility];  Service: General;  Laterality: N/A;  . Proctoscopy  02/03/2013    Procedure: PROCTOSCOPY;  Surgeon: [name], MD;  [facility]: [facility];  Service: General;;    There were no vitals filed for this visit.  Visit Diagnosis:  Right anterior knee pain  Right leg weakness  Stiffness of joint, lower leg, right      Subjective Assessment - 12/19/14 1024    Subjective Pt states that she now has sharp stabbing pain on the outside of her knee.    Currently in Pain? No/denies  with moving laterally or deep squatting will go to a 7/10 for a short period of time    Pain Score 7    Pain Location Knee   Pain Orientation Right   Pain Descriptors / Indicators Sharp   Pain Onset More than a  month ago                [facility] Adult PT Treatment/Exercise - 12/19/14 0001    Knee/Hip Exercises: Standing   Knee Flexion Left;10 reps;Limitations   Knee Flexion Limitations 10#   Lateral Step Up Left;Hand Hold: 0;Step Height: 4"   Forward Step Up Right;15 reps;Hand Hold: 0   Step Down Right;Hand Hold: 0;Step Height: 4"   Functional Squat 10 reps   Functional Squat Limitations to floor    Rocker Board 2 minutes   SLS with Vectors 20 seconds                 PT Education - 12/19/14 1052    Education provided Yes   Education Details Importance of eating prior to coming to therapy    Person(s) Educated Patient   Methods Explanation   Comprehension Returned demonstration          PT Short Term Goals - 12/11/14 1055    PT SHORT TERM GOAL #1   Title I HEP   Status On-going   PT SHORT TERM GOAL #2   Title Pain to be no greater than a 4/10 at any point   Status On-going   PT  SHORT TERM GOAL #3   Title Pt to be able to go up steps without pain   Status On-going           PT Long Term Goals - 12/11/14 1055    PT LONG TERM GOAL #1   Title I in advance HEP   PT LONG TERM GOAL #2   Title Pt pain to be no greater than a 1 at any point   Status On-going   PT LONG TERM GOAL #3   Title Pt to be able to come down steps without pain   Status On-going   PT LONG TERM GOAL #4   Title Pt ROM to be to 130 degrees to be able to squt without increased pain.   Status On-going   PT LONG TERM GOAL #5   Title Strength to be wnl to be able to return from squt position without pain   Status On-going   PT LONG TERM GOAL #6   Title Pt to return to biking and hiking for healthier lifestyle.               Plan - 12/19/14 1054    Clinical Impression Statement Pt became light headed, dizzy and vomitted during treatment session.  Pt stated that she had not ate.  Session was cut short.   Pt given banana chips and water.  Pt stated relief of symptoms after she had ate  something.    PT Next Visit Plan continue to progress LE stength in pain free ROM. Continue with agility ladder activities to improve sequencing and coordination.  Begin Yoga poses        Problem List There are no active problems to display for this patient.  Rayetta Humphrey, PT CLT [phone] 12/19/2014, 10:59 AM  [place], [place], [address] Phone: [phone]   Fax:  [phone]

## 2014-12-21 ENCOUNTER — Ambulatory Visit (HOSPITAL_COMMUNITY): Payer: Managed Care, Other (non HMO) | Admitting: Physical Therapy

## 2014-12-21 DIAGNOSIS — M25561 Pain in right knee: Secondary | ICD-10-CM

## 2014-12-21 DIAGNOSIS — R29898 Other symptoms and signs involving the musculoskeletal system: Secondary | ICD-10-CM

## 2014-12-21 DIAGNOSIS — M25661 Stiffness of right knee, not elsewhere classified: Secondary | ICD-10-CM

## 2014-12-21 NOTE — Therapy (Addendum)
[place], [place], [address] Phone: [phone]   Fax:  [phone]  Physical Therapy Treatment  Patient Details  Name: Virginia Barrett MRN: [id number] Date of Birth: 11-20-1975 Referring Provider:  Carole Civil, MD  Encounter Date: 12/21/2014      PT End of Session - 12/21/14 0847    Visit Number 8   Number of Visits 12   Date for PT Re-Evaluation 12/14/14   PT Start Time 0805   PT Stop Time 0850   PT Time Calculation (min) 45 min   Activity Tolerance Patient tolerated treatment well      Past Medical History  Diagnosis Date  . Diverticulitis   . Headache(784.0)     occasional   . Pneumonia     hx of walking pneumonia   . Precancerous skin lesion   . Sinus infection     diagnosed on 01/28/13     Past Surgical History  Procedure Laterality Date  . Mouth surgery      wisdom teeth removed  . Colonoscopy N/A 11/17/2012    Procedure: COLONOSCOPY;  Surgeon: [name], MD;  [facility];  Service: Endoscopy;  Laterality: N/A;  1200-moved to1030 Ann to notify pt  . Laparoscopic sigmoid colectomy N/A 02/03/2013    Procedure: LAPAROSCOPIC SIGMOID COLECTOMY;  Surgeon: [name], MD;  [facility];  Service: General;  Laterality: N/A;  . Proctoscopy  02/03/2013    Procedure: PROCTOSCOPY;  Surgeon: [name], MD;  Location: [facility];  Service: General;;    There were no vitals filed for this visit.  Visit Diagnosis:  Right anterior knee pain  Right leg weakness  Stiffness of joint, lower leg, right      Subjective Assessment - 12/21/14 0850    Subjective Pt states she feels much better today.  Currently without pain.   Currently in Pain? No/denies                         [facility] Adult PT Treatment/Exercise - 12/21/14 0810    Knee/Hip Exercises: Stretches   Active Hamstring Stretch 3 reps;30 seconds   Active Hamstring Stretch Limitations 14" box  bilateral   Gastroc Stretch 3 reps;30 seconds   Gastroc Stretch Limitations slant board   Knee/Hip Exercises: Aerobic   Stationary Bike nustep 8 minutes level 3 hills #3   Knee/Hip Exercises: Standing   Knee Flexion Left;10 reps;Limitations   Knee Flexion Limitations 10#   Forward Lunges Both;15 reps   Forward Lunges Limitations onto floor 1 HHA   Side Lunges Both;15 reps   Side Lunges Limitations Rt onto 4" step, Lt onto floor no HHA   Lateral Step Up Right;15 reps;Step Height: 6";Hand Hold: 0   Forward Step Up Right;15 reps;Step Height: 6";Hand Hold: 0   Step Down Right;15 reps;Step Height: 4";Hand Hold: 1   Functional Squat 10 reps   Rocker Board 2 minutes   Rocker Board Limitations R/L and A/P no UE's   SLS with Vectors 20 seconds 3 reps    Other Standing Knee Exercises 5 position squats 5X each (neutral, IR, ER, split stance X2   Other Standing Knee Exercises hip extension, abduction 10 reps each LE                  PT Short Term Goals - 12/11/14 1055    PT SHORT TERM GOAL #1   Title I  HEP   Status On-going   PT SHORT TERM GOAL #2   Title Pain to be no greater than a 4/10 at any point   Status On-going   PT SHORT TERM GOAL #3   Title Pt to be able to go up steps without pain   Status On-going           PT Long Term Goals - 12/11/14 1055    PT LONG TERM GOAL #1   Title I in advance HEP   PT LONG TERM GOAL #2   Title Pt pain to be no greater than a 1 at any point   Status On-going   PT LONG TERM GOAL #3   Title Pt to be able to come down steps without pain   Status On-going   PT LONG TERM GOAL #4   Title Pt ROM to be to 130 degrees to be able to squt without increased pain.   Status On-going   PT LONG TERM GOAL #5   Title Strength to be wnl to be able to return from squt position without pain   Status On-going   PT LONG TERM GOAL #6   Title Pt to return to biking and hiking for healthier lifestyle.               Plan - 12/21/14 0847     Clinical Impression Statement Continued focus on increasing Rt LE strength for return to biking/regular exericise.  Able to increase to 6" step today for forward and lateral step ups without pain, however had to remain with 4" step for step downs due to eccentric  weakness.  Added multi plane squats to utilize entire quadricep musculature.  Pt able to complete all exercises without c/o pain, only discomfort with step downs.   PT Next Visit Plan continue to progress LE stength in pain free ROM.  Add agility ladder activities to improve sequencing and coordination and progress to bike.          Problem List There are no active problems to display for this patient.   Teena Irani, PTA/CLT [phone]  12/21/2014, 8:51 AM  [place] [address] [place], [place], [address] Phone: [phone]   Fax:  [phone]   PHYSICAL THERAPY DISCHARGE SUMMARY  Visits from Start of Care: 8  Current functional level related to goals / functional outcomes: Unknown pt did not return   Remaining deficits: unknown   Education / Equipment: HEP  Plan: Patient agrees to discharge.  Patient goals were partially met. Patient is being discharged due to not returning since the last visit.  ?????       Rayetta Humphrey, [place] CLT [phone]

## 2015-01-09 ENCOUNTER — Ambulatory Visit (INDEPENDENT_AMBULATORY_CARE_PROVIDER_SITE_OTHER): Payer: Managed Care, Other (non HMO) | Admitting: Orthopedic Surgery

## 2015-01-09 ENCOUNTER — Encounter: Payer: Self-pay | Admitting: Orthopedic Surgery

## 2015-01-09 VITALS — BP 114/79 | Ht 67.0 in | Wt 275.0 lb

## 2015-01-09 DIAGNOSIS — M94261 Chondromalacia, right knee: Secondary | ICD-10-CM

## 2015-01-09 NOTE — Patient Instructions (Signed)
Continue therapy

## 2015-01-09 NOTE — Addendum Note (Signed)
Addended by: Arther Abbott E on: 01/09/2015 10:46 AM   Modules accepted: Medications

## 2015-01-09 NOTE — Progress Notes (Signed)
Patient ID: Virginia Barrett, female   DOB: Apr 22, 1976, 39 y.o.   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 She did complete therapy she still in therapy she says she is much better although she still has trouble squatting and some climbing activities  System review sows no catching locking or giving way of the right knee  BP 114/79 mmHg  Ht 5\' 7"  (1.702 m)  Wt 275 lb (124.739 kg)  BMI 43.06 kg/m2 She still has crepitance of patella tracks normally she is ambulating well she has mild tenderness in the peripatellar region her knee remained stable neurovascular exam is intact   She will continue physical therapy and follow-up with [place] in 3 months

## 2015-04-12 ENCOUNTER — Ambulatory Visit: Payer: Managed Care, Other (non HMO) | Admitting: Orthopedic Surgery

## 2015-05-18 IMAGING — CT CT ABD-PELV W/ CM
2 of 3 series · 16 of 46 positions shown, 18 images · IV contrast (Omnipaque 300)
Comparison: 05/15/2012

CLINICAL DATA: history of diverticulosis.

CT ABDOMEN AND PELVIS WITH CONTRAST
TECHNIQUE: Multidetector CT imaging of the abdomen and pelvis was
performed following the standard protocol during bolus
administration of intravenous contrast.
Contrast: 100mL OMNIPAQUE IOHEXOL 300 MG/ML  SOLN

[Series 2: abd_pel_with 5.0 b40f · axial · 0.73mm/px · z∈[-480,-74]mm · 13 of 95 slices shown, 15 images]
[im 7/95  soft-tissue]
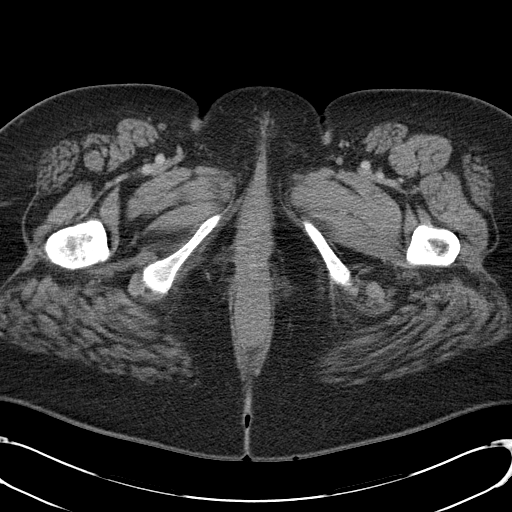
[im 7/95  bone]
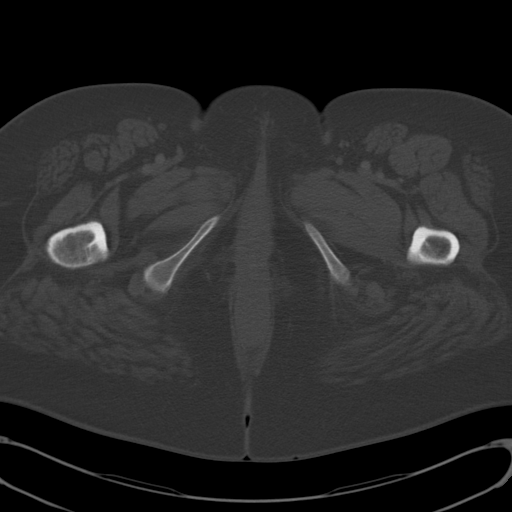
[im 13/95  soft-tissue]
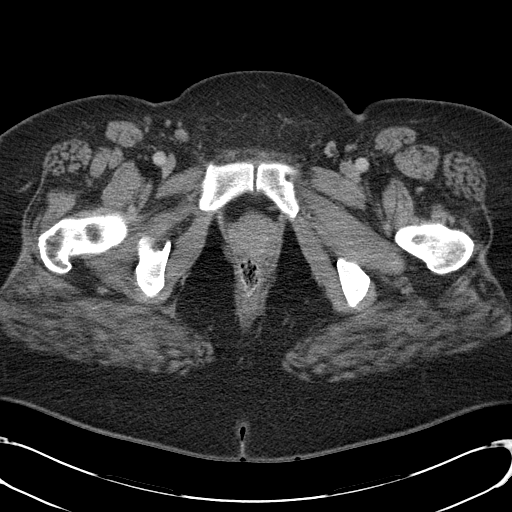
[im 19/95  soft-tissue]
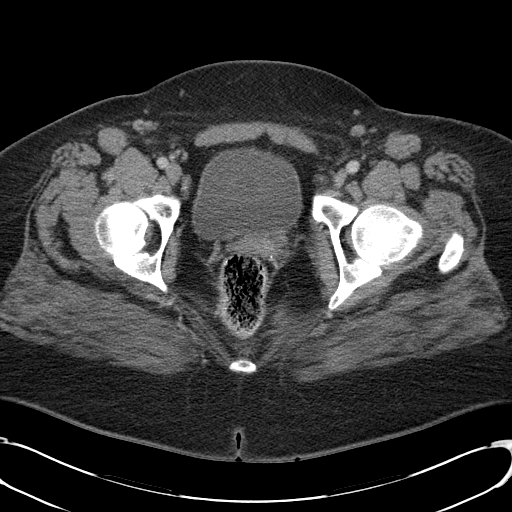
[im 28/95  soft-tissue]
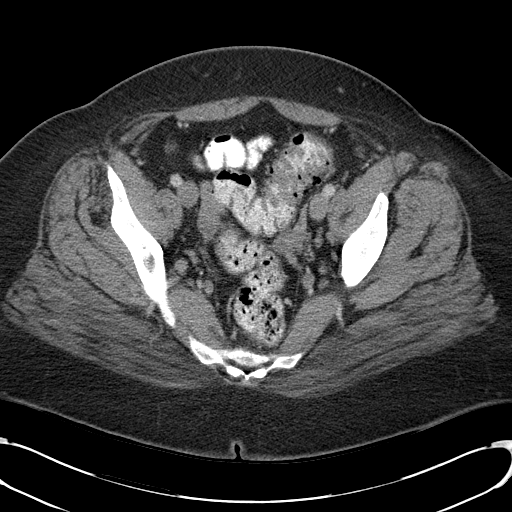
[im 34/95  soft-tissue]
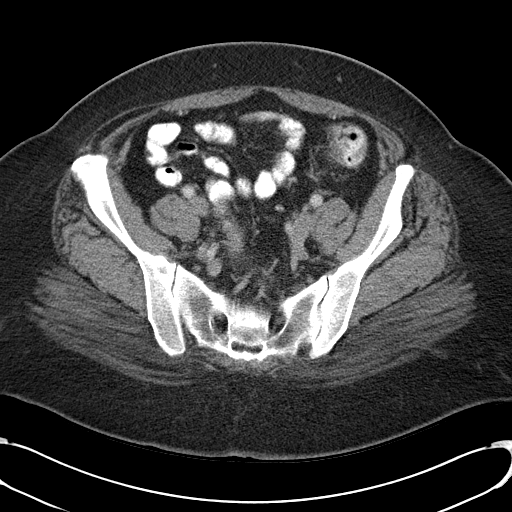
[im 40/95  soft-tissue]
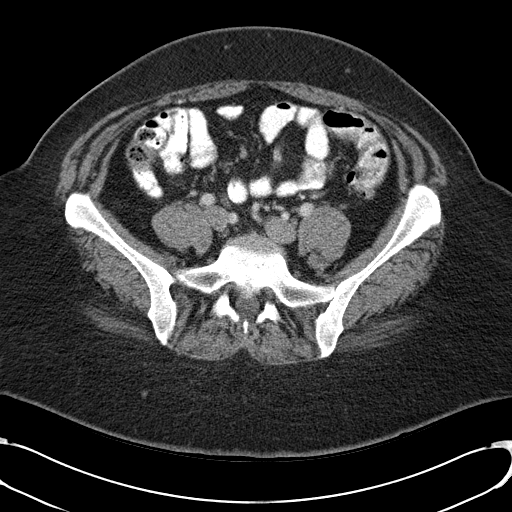
[im 49/95  soft-tissue]
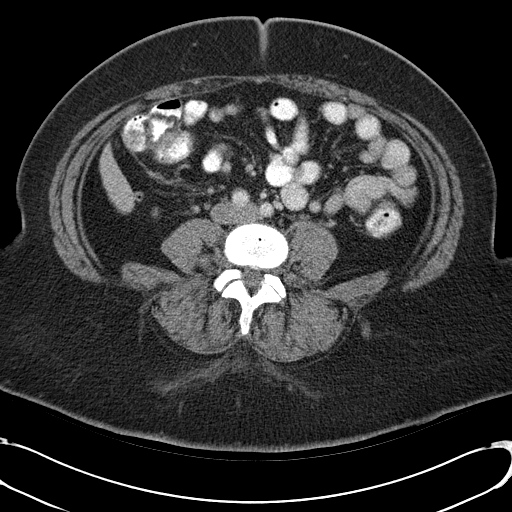
[im 55/95  soft-tissue]
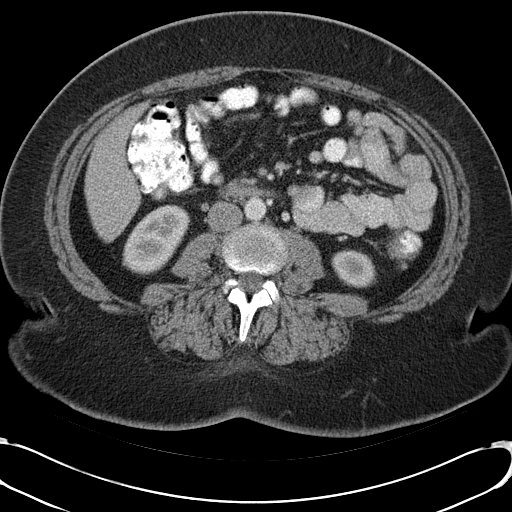
[im 61/95  soft-tissue]
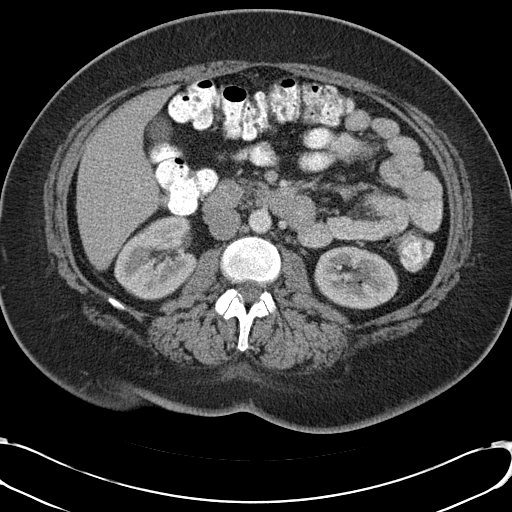
[im 61/95  bone]
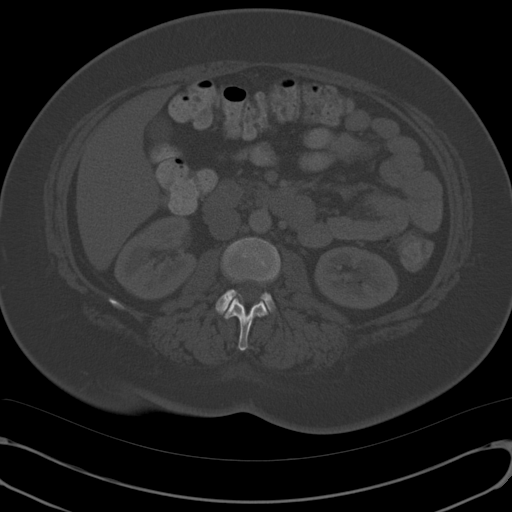
[im 67/95  soft-tissue]
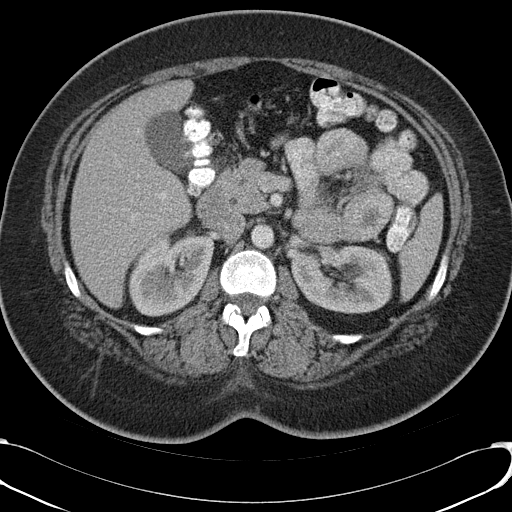
[im 76/95  soft-tissue]
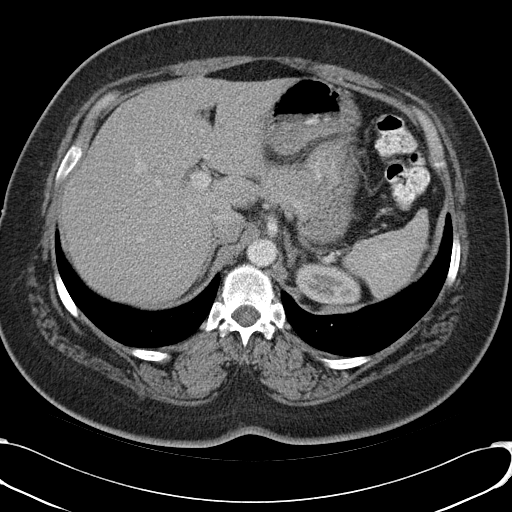
[im 82/95  soft-tissue]
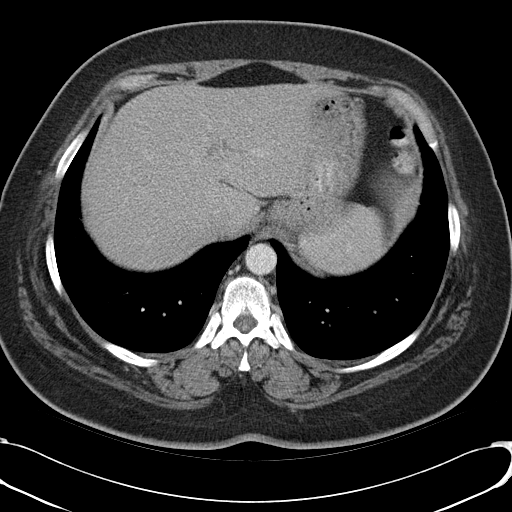
[im 88/95  soft-tissue]
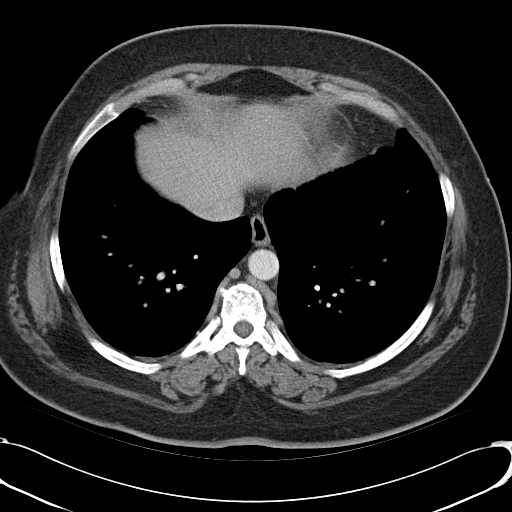

[Series 4: abd_pel_with 3.0 spo cor · coronal · 0.74mm/px · 3 of 80 slices shown]
[im 27/80  soft-tissue]
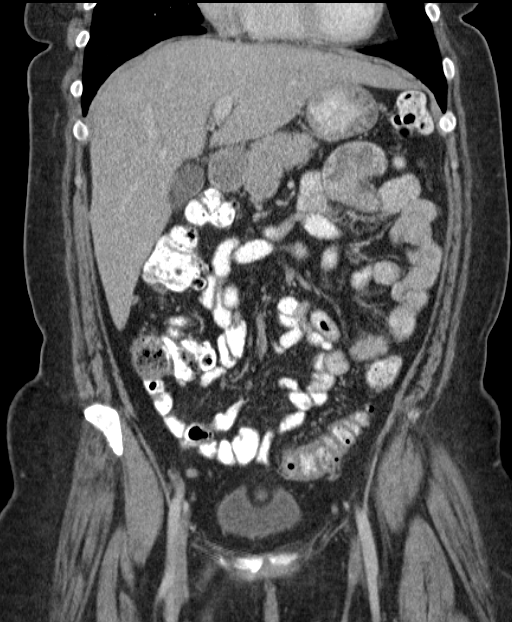
[im 36/80  soft-tissue]
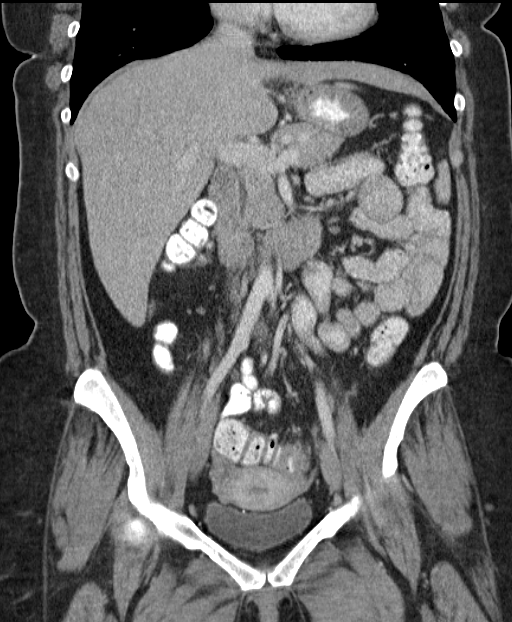
[im 44/80  soft-tissue]
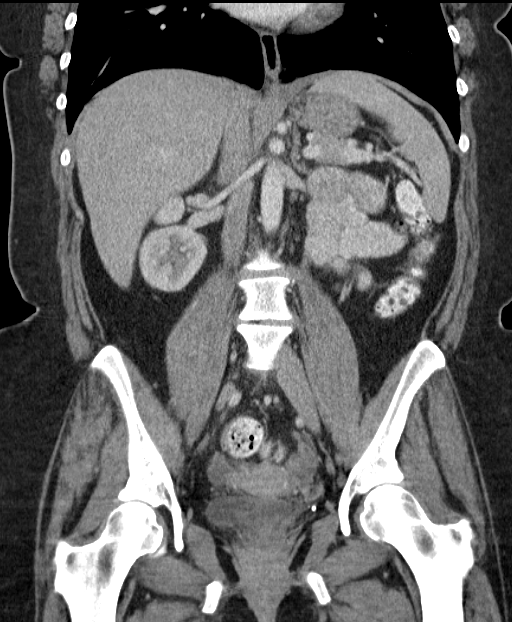

[16 of 46 positions shown; findings below may reference images not displayed]

FINDINGS: The lung bases appear clear.  There is no pericardial or
pleural effusion identified.

There is no focal liver abnormality identified.  The gallbladder is
normal.  No biliary dilatation.  Normal appearance of the pancreas.

The spleen is normal.  The adrenal glands are normal.  Normal
appearance of the right kidney.  The left kidney is also normal.
Urinary bladder is unremarkable.  The uterus and adnexal structures
are unremarkable.

Normal caliber of the abdominal aorta.  No upper abdominal
adenopathy identified.  No pelvic or inguinal adenopathy.

There is no free fluid or abnormal fluid collections noted within
the abdomen or pelvis.  The stomach is normal.  Small bowel loops
are unremarkable.  Normal appearance of the appendix.  The proximal
colon is normal.  Scattered distal colonic diverticula are
identified.  There is no acute inflammation.

No free fluid or abnormal fluid collections identified.

Review of the visualized osseous structures unremarkable for L4-5
degenerative disc disease.
IMPRESSION: 1.  No acute findings.
2.  Sigmoid diverticulosis without evidence for acute inflammation.
3.  No evidence for bowel obstruction or abscess formation.

## 2016-02-15 ENCOUNTER — Emergency Department (HOSPITAL_COMMUNITY): Payer: Managed Care, Other (non HMO)

## 2016-02-15 ENCOUNTER — Encounter (HOSPITAL_COMMUNITY): Payer: Self-pay | Admitting: *Deleted

## 2016-02-15 ENCOUNTER — Emergency Department (HOSPITAL_COMMUNITY)
Admission: EM | Admit: 2016-02-15 | Discharge: 2016-02-15 | Disposition: A | Discharge status: Home / Self Care | Payer: Managed Care, Other (non HMO) | Attending: Emergency Medicine | Admitting: Emergency Medicine

## 2016-02-15 DIAGNOSIS — K5712 Diverticulitis of small intestine without perforation or abscess without bleeding: Secondary | ICD-10-CM | POA: Insufficient documentation

## 2016-02-15 DIAGNOSIS — R1032 Left lower quadrant pain: Secondary | ICD-10-CM | POA: Diagnosis present

## 2016-02-15 LAB — CBC WITH DIFFERENTIAL/PLATELET
BASOS ABS: 0 10*3/uL (ref 0.0–0.1)
BASOS PCT: 0 %
EOS PCT: 3 %
Eosinophils Absolute: 0.3 10*3/uL (ref 0.0–0.7)
HCT: 39.6 % (ref 36.0–46.0)
Hemoglobin: 13.1 g/dL (ref 12.0–15.0)
LYMPHS PCT: 20 %
Lymphs Abs: 2.5 10*3/uL (ref 0.7–4.0)
MCH: 29.6 pg (ref 26.0–34.0)
MCHC: 33.1 g/dL (ref 30.0–36.0)
MCV: 89.6 fL (ref 78.0–100.0)
Monocytes Absolute: 1.5 10*3/uL — ABNORMAL HIGH (ref 0.1–1.0)
Monocytes Relative: 12 %
Neutro Abs: 8.3 10*3/uL — ABNORMAL HIGH (ref 1.7–7.7)
Neutrophils Relative %: 65 %
PLATELETS: 441 10*3/uL — AB (ref 150–400)
RBC: 4.42 MIL/uL (ref 3.87–5.11)
RDW: 13.3 % (ref 11.5–15.5)
WBC: 12.8 10*3/uL — AB (ref 4.0–10.5)

## 2016-02-15 LAB — URINALYSIS, ROUTINE W REFLEX MICROSCOPIC
Bilirubin Urine: NEGATIVE
Glucose, UA: NEGATIVE mg/dL
HGB URINE DIPSTICK: NEGATIVE
Ketones, ur: NEGATIVE mg/dL
LEUKOCYTES UA: NEGATIVE
Nitrite: NEGATIVE
PROTEIN: NEGATIVE mg/dL
Specific Gravity, Urine: 1.02 (ref 1.005–1.030)
pH: 6 (ref 5.0–8.0)

## 2016-02-15 LAB — COMPREHENSIVE METABOLIC PANEL
ALT: 22 U/L (ref 14–54)
ANION GAP: 6 (ref 5–15)
AST: 18 U/L (ref 15–41)
Albumin: 3.9 g/dL (ref 3.5–5.0)
Alkaline Phosphatase: 44 U/L (ref 38–126)
BILIRUBIN TOTAL: 0.6 mg/dL (ref 0.3–1.2)
BUN: 12 mg/dL (ref 6–20)
CO2: 28 mmol/L (ref 22–32)
Calcium: 8.6 mg/dL — ABNORMAL LOW (ref 8.9–10.3)
Chloride: 103 mmol/L (ref 101–111)
Creatinine, Ser: 0.71 mg/dL (ref 0.44–1.00)
GFR calc Af Amer: >60 mL/min (ref >60)
Glucose, Bld: 111 mg/dL — ABNORMAL HIGH (ref 65–99)
POTASSIUM: 3.9 mmol/L (ref 3.5–5.1)
Sodium: 137 mmol/L (ref 135–145)
TOTAL PROTEIN: 7.3 g/dL (ref 6.5–8.1)

## 2016-02-15 LAB — I-STAT BETA HCG BLOOD, ED (MC, WL, AP ONLY): HCG, QUANTITATIVE: 10.1 m[IU]/mL — AB (ref <5)

## 2016-02-15 LAB — HCG, QUANTITATIVE, PREGNANCY

## 2016-02-15 LAB — LIPASE, BLOOD: Lipase: 30 U/L (ref 11–51)

## 2016-02-15 LAB — POC URINE PREG, ED: PREG TEST UR: NEGATIVE

## 2016-02-15 MED ORDER — CIPROFLOXACIN HCL 500 MG PO TABS: 500.0000 mg | ORAL_TABLET | Freq: Two times a day (BID) | ORAL | Status: DC

## 2016-02-15 MED ORDER — METRONIDAZOLE 500 MG PO TABS: 500.0000 mg | ORAL_TABLET | Freq: Three times a day (TID) | ORAL | Status: DC

## 2016-02-15 MED ORDER — SODIUM CHLORIDE 0.9 % IV BOLUS (SEPSIS)
1000.0000 mL | Freq: Once | INTRAVENOUS | Status: AC
  Administered 2016-02-15: 1000 mL via INTRAVENOUS

## 2016-02-15 MED ORDER — TRAMADOL HCL 50 MG PO TABS
50.0000 mg | ORAL_TABLET | Freq: Four times a day (QID) | ORAL | Status: DC | PRN
Start: 2016-02-15 — End: 2018-01-12

## 2016-02-15 MED ORDER — DIPHENHYDRAMINE HCL 50 MG/ML IJ SOLN
25.0000 mg | Freq: Once | INTRAMUSCULAR | Status: AC
  Administered 2016-02-15: 25 mg via INTRAVENOUS
  Filled 2016-02-15: qty 1

## 2016-02-15 MED ORDER — IOPAMIDOL (ISOVUE-300) INJECTION 61%
100.0000 mL | Freq: Once | INTRAVENOUS | Status: AC | PRN
  Administered 2016-02-15: 100 mL via INTRAVENOUS

## 2016-02-15 MED ORDER — CIPROFLOXACIN IN D5W 400 MG/200ML IV SOLN
400.0000 mg | Freq: Once | INTRAVENOUS | Status: AC
  Administered 2016-02-15: 400 mg via INTRAVENOUS
  Filled 2016-02-15: qty 200

## 2016-02-15 MED ORDER — METRONIDAZOLE IN NACL 5-0.79 MG/ML-% IV SOLN
500.0000 mg | Freq: Once | INTRAVENOUS | Status: AC
  Administered 2016-02-15: 500 mg via INTRAVENOUS
  Filled 2016-02-15: qty 100

## 2016-02-15 NOTE — ED Notes (Signed)
MD at bedside. 

## 2016-02-15 NOTE — Discharge Instructions (Signed)
Take only clear liquids until seen by your md monday

## 2016-02-15 NOTE — ED Notes (Signed)
Pt comes in for LLQ abdominal pain. Pt states this has been present for 2 weeks. She has had decreased bowel movements in the last 2 weeks, when she does make a BM they are loose. Denies any vomiting.

## 2016-02-15 NOTE — ED Notes (Signed)
Unable to get blood ?

## 2016-02-15 NOTE — ED Provider Notes (Signed)
CSN: [id number]     Arrival date & time 02/15/16  0810 History  By signing my name below, I, Virginia Barrett, attest that this documentation has been prepared under the direction and in the presence of Virginia Ferguson, MD. Electronically Signed: [name], ED Scribe. 02/15/2016. 8:28 AM.   Chief Complaint  Patient presents with  . Abdominal Pain   Patient is a 40 y.o. female presenting with abdominal pain. The history is provided by the patient. No language interpreter was used.  Abdominal Pain Pain location:  LLQ Pain quality: pressure   Pain radiates to:  Does not radiate Pain severity:  Moderate Onset quality:  Gradual Duration:  2 weeks Timing:  Constant Progression:  Worsening Chronicity:  Recurrent Associated symptoms: constipation   Associated symptoms: no chest pain, no cough, no diarrhea, no fatigue and no hematuria     HPI Comments: Virginia Barrett is a 40 y.o. female with a PMHx of diverticulitis who presents to the Emergency Department complaining of worsening, moderate, pressure-like, LLQ pain x 2 weeks. Pt states her GI doctor (Dr. [name]) is out of town and because they couldn't take walk-ins today was sent to the ED for evaluation. Pt reports a PMHx of diverticulitis which was corrected with a sigmoid colectomy ~3 years ago which provided relief until 2 weeks ago with her abd pain beginning once again and progressively worsening. She states her pain is consistent with past diverticulitis exacerbations. Her pain worsens with palpation. She reports associated, mild, constipation stating when she does have a BM they are "loose". Her LNMP was 3 weeks ago. Pt denies n/v, fevers and chills.   Past Medical History  Diagnosis Date  . Diverticulitis   . Headache(784.0)     occasional   . Pneumonia     hx of walking pneumonia   . Precancerous skin lesion   . Sinus infection     diagnosed on 01/28/13    Past Surgical History  Procedure Laterality Date  . Mouth surgery       wisdom teeth removed  . Colonoscopy N/A 11/17/2012    Procedure: COLONOSCOPY;  Surgeon: [name], MD;  Location: [facility];  Service: Endoscopy;  Laterality: N/A;  1200-moved to1030 Ann to notify pt  . Laparoscopic sigmoid colectomy N/A 02/03/2013    Procedure: LAPAROSCOPIC SIGMOID COLECTOMY;  Surgeon: [name], MD;  Location: [facility];  Service: General;  Laterality: N/A;  . Proctoscopy  02/03/2013    Procedure: PROCTOSCOPY;  Surgeon: [name], MD;  Location: [facility];  Service: General;;   Family History  Problem Relation Age of Onset  . Colon polyps Mother   . Diabetes Mother   . COPD Mother   . Colon polyps Other   . Colon cancer Neg Hx   . Hodgkin's lymphoma Father   . Cancer Maternal Aunt     breast  . Cancer Maternal Grandmother     breast   Social History  Substance Use Topics  . Smoking status: Never Smoker   . Smokeless tobacco: Never Used  . Alcohol Use: Yes     Comment: rarely   OB History    No data available      Review of Systems  Constitutional: Negative for appetite change and fatigue.  HENT: Negative for congestion, ear discharge and sinus pressure.   Eyes: Negative for discharge.  Respiratory: Negative for cough.   Cardiovascular: Negative for chest pain.  Gastrointestinal: Positive for abdominal pain and constipation. Negative  for diarrhea.  Genitourinary: Negative for frequency and hematuria.  Musculoskeletal: Negative for back pain.  Skin: Negative for rash.  Neurological: Negative for seizures and headaches.  Psychiatric/Behavioral: Negative for hallucinations.  All other systems reviewed and are negative.   Allergies  Cinnamon; Sulfur; Ciprofloxacin; Codeine; Penicillins; and Red dye  Home Medications   Prior to Admission medications   Medication Sig Start Date End Date Taking? Authorizing Provider  acetaminophen (TYLENOL) 500 MG tablet Take 1,000 mg by mouth every 6 (six) hours as needed for pain.    Historical Provider, MD   fluticasone (FLONASE) 50 MCG/ACT nasal spray Place 2 sprays into the nose daily.    Historical Provider, MD  Homeopathic Products [facility] COLD REMEDY PO) Take by mouth. Patient took on 01/28/13 and 01/29/13.    Historical Provider, MD  HYDROcodone-acetaminophen (NORCO) 5-325 MG per tablet Take 1 tablet by mouth every 6 (six) hours as needed for pain. Patient not taking: Reported on 11/07/2014 03/01/13   [name], MD  ibuprofen (ADVIL,MOTRIN) 200 MG tablet Take 800 mg by mouth every 6 (six) hours as needed for pain.    Historical Provider, MD  polyethylene glycol (MIRALAX / GLYCOLAX) packet Take 17 g by mouth daily.    Historical Provider, MD  Probiotic Product (ALIGN PO) Take by mouth.    Historical Provider, MD   BP 139/85 mmHg  Pulse 108  Temp(Src) 98.1 F (36.7 C) (Oral)  Resp 16  Ht 5\' 6"  (1.676 m)  Wt 240 lb (108.863 kg)  BMI 38.76 kg/m2  SpO2 100%  LMP 01/25/2016    Physical Exam  Constitutional: She is oriented to person, place, and time. She appears well-developed.  HENT:  Head: Normocephalic.  Eyes: Conjunctivae and EOM are normal. No scleral icterus.  Neck: Neck supple. No thyromegaly present.  Cardiovascular: Normal rate and regular rhythm.  Exam reveals no gallop and no friction rub.   No murmur heard. Pulmonary/Chest: No stridor. She has no wheezes. She has no rales. She exhibits no tenderness.  Abdominal: Soft. She exhibits no distension. There is tenderness. There is no rebound.  Moderate LLQ tenderness  Musculoskeletal: Normal range of motion. She exhibits no edema.  Lymphadenopathy:    She has no cervical adenopathy.  Neurological: She is oriented to person, place, and time. She exhibits normal muscle tone. Coordination normal.  Skin: No rash noted. No erythema.  Psychiatric: She has a normal mood and affect. Her behavior is normal.    ED Course  Procedures  DIAGNOSTIC STUDIES: Oxygen Saturation is 100% on RA, normal by my interpretation.    COORDINATION OF  CARE: 8:27 AM Discussed next steps with pt.t verbalized understanding and is agreeable with the plan.   Labs Review Labs Reviewed  COMPREHENSIVE METABOLIC PANEL - Abnormal; Notable for the following:    Glucose, Bld 111 (*)    Calcium 8.6 (*)    All other components within normal limits  CBC WITH DIFFERENTIAL/PLATELET - Abnormal; Notable for the following:    WBC 12.8 (*)    Platelets 441 (*)    Neutro Abs 8.3 (*)    Monocytes Absolute 1.5 (*)    All other components within normal limits  I-STAT BETA HCG BLOOD, ED ([facility], [facility], [facility] ONLY) - Abnormal; Notable for the following:    I-stat hCG, quantitative 10.1 (*)    All other components within normal limits  LIPASE, BLOOD  URINALYSIS, ROUTINE W REFLEX MICROSCOPIC (NOT AT [facility])    Imaging Review No results found. I  have personally reviewed and evaluated these images and lab results as part of my medical decision-making.   EKG Interpretation None      MDM   Final diagnoses:  None    Patient with diverticulitis. Patient nontoxic patient wants to go home. I spoke with the GI doctor and we will discharge the patient home on Cipro and Flagyl and pain medicine and a clear liquid diet. She is to follow-up Monday but return sooner if any problems.The chart was scribed for me under my direct supervision.  I personally performed the history, physical, and medical decision making and all procedures in the evaluation of this patient.Virginia Ferguson, MD 02/15/16 [phone]

## 2016-02-19 ENCOUNTER — Ambulatory Visit (INDEPENDENT_AMBULATORY_CARE_PROVIDER_SITE_OTHER): Payer: Managed Care, Other (non HMO) | Admitting: Internal Medicine

## 2016-02-19 ENCOUNTER — Encounter (INDEPENDENT_AMBULATORY_CARE_PROVIDER_SITE_OTHER): Payer: Self-pay | Admitting: Internal Medicine

## 2016-02-19 VITALS — BP 104/72 | HR 74 | Temp 97.9°F | Ht 66.0 in | Wt 263.1 lb

## 2016-02-19 DIAGNOSIS — K5732 Diverticulitis of large intestine without perforation or abscess without bleeding: Secondary | ICD-10-CM

## 2016-02-19 DIAGNOSIS — R110 Nausea: Secondary | ICD-10-CM

## 2016-02-19 DIAGNOSIS — K589 Irritable bowel syndrome without diarrhea: Secondary | ICD-10-CM

## 2016-02-19 MED ORDER — CIPROFLOXACIN HCL 500 MG PO TABS: 500.0000 mg | ORAL_TABLET | Freq: Two times a day (BID) | ORAL | 0 refills | Status: DC

## 2016-02-19 MED ORDER — HYOSCYAMINE SULFATE ER 0.375 MG PO TB12: 0.3750 mg | ORAL_TABLET | Freq: Every day | ORAL | Status: DC | PRN

## 2016-02-19 MED ORDER — ONDANSETRON HCL 4 MG PO TABS: 4.0000 mg | ORAL_TABLET | Freq: Three times a day (TID) | ORAL | 0 refills | Status: DC | PRN

## 2016-02-19 MED ORDER — METRONIDAZOLE 500 MG PO TABS: 500.0000 mg | ORAL_TABLET | Freq: Three times a day (TID) | ORAL | 0 refills | Status: DC

## 2016-02-19 MED ORDER — HYOSCYAMINE SULFATE ER 0.375 MG PO TB12: 0.3750 mg | ORAL_TABLET | Freq: Every day | ORAL | 5 refills | Status: DC | PRN

## 2016-02-19 NOTE — Progress Notes (Signed)
Presenting complaint;  Follow-up for diverticulitis.  Subjective:  Virginia Barrett is 40 year old Caucasian female was history of diverticulitis and underwent laparoscopic sigmoid colon resection 3 years ago. She states ever since surgery she has noted increased sensitivity to certain foods resulting in a bowel movement within 1530 minutes of meals. When this occurs she passes soft and loose stool. About one month ago she started to feel bloated and could not eat a full meal. She also noted mild discomfort across lower abdomen radiating from left to right side. About 2 weeks ago this pain became sharp. She went on a limited diet. She did not have fever nausea vomiting melena or rectal bleeding. She states she lost 10 pounds. Last week pain was unbearable. She called office and was advised to go to emergency room. She was evaluated by Dr. Roderic Palau. She was begun on Cipro and metronidazole. He was also given Benadryl because she had reaction with IV Cipro. She is not having any problem with by mouth Cipro. He has noted metallic taste. She started to feel better as of yesterday. She is on full liquid diet. She does take ibuprofen for musculoskeletal pain or headache. He usually takes 800 mg a day.   Current Medications: Outpatient Encounter Prescriptions as of 02/19/2016  Medication Sig  . ciprofloxacin (CIPRO) 500 MG tablet Take 1 tablet (500 mg total) by mouth 2 (two) times daily. One po bid x 7 days  . ibuprofen (ADVIL,MOTRIN) 200 MG tablet Take 800 mg by mouth every 6 (six) hours as needed for pain.  . metroNIDAZOLE (FLAGYL) 500 MG tablet Take 1 tablet (500 mg total) by mouth 3 (three) times daily. One po bid x 7 days  . traMADol (ULTRAM) 50 MG tablet Take 1 tablet (50 mg total) by mouth every 6 (six) hours as needed.   No facility-administered encounter medications on file as of 02/19/2016.      Objective: Blood pressure 104/72, pulse 74, temperature 97.9 F (36.6 C), temperature source Oral, height  5\' 6"  (1.676 m), weight 263 lb 1.6 oz (119.3 kg), last menstrual period 01/25/2016. Patient is alert and in no acute distress. Conjunctiva is pink. Sclera is nonicteric Oropharyngeal mucosa is normal. No neck masses or thyromegaly noted. Cardiac exam with regular rhythm normal S1 and S2. No murmur or gallop noted. Lungs are clear to auscultation. Abdomen is obese. Bowel sounds are normal. On palpation abdomen is soft with mild tenderness in left lower quadrant without guarding. No organomegaly or masses. No LE edema or clubbing noted.  Labs/studies Results: Lab data from 02/15/2016  WBC 12.8, H&H 13.1 and 39.6 and platelet count 440 1K.  Comprehensive chemistry panel normal. CT films reviewed. There is 2 mm stone in left kidney without hydronephrosis. There is colonic wall wall thickening in proximal sigmoid colon with mesenteric edema and evidence of previous surgery. Small phlegmon without an abscess.  Assessment:  #1. Left-sided diverticulitis in a patient with history of diverticulitis and status post sigmoid colon resection 3 years ago. This episode possibly has been treated by NSAID use. She may also have an element of IBS with loss of sigmoid colon reservoir. Will consider therapy when acute symptoms have resolved.   Plan:  Patient advanced diet over the next day or two. Continue Cipro and metronidazole for total of 10 days. Patient to call office with progress report on day 9. Patient advised to keep NSAID use to minimum. He should not take more than 400 mg of ibuprofen at a given time. Office visit  in 6 months.

## 2016-02-19 NOTE — Patient Instructions (Addendum)
Keep Advil or ibuprofen use to minimum and do not take more than 400 mg at the time. Call office with progress report one day before you finish antibiotic treatment. Can advance diet starting tomorrow morning.

## 2016-08-26 ENCOUNTER — Encounter (INDEPENDENT_AMBULATORY_CARE_PROVIDER_SITE_OTHER): Payer: Self-pay | Admitting: Internal Medicine

## 2016-08-26 ENCOUNTER — Ambulatory Visit (INDEPENDENT_AMBULATORY_CARE_PROVIDER_SITE_OTHER): Payer: Managed Care, Other (non HMO) | Admitting: Internal Medicine

## 2017-03-11 IMAGING — MR MR KNEE*R* W/O CM
4 of 7 series · 13 of 40 positions shown · non-contrast
Comparison: None.

CLINICAL DATA: Right knee pain for years.

EXAM:
MRI OF THE RIGHT KNEE WITHOUT CONTRAST
TECHNIQUE: Multiplanar, multisequence MR imaging of the knee was performed. No
intravenous contrast was administered.

[Series 3: pdfs axial · axial · 3.0mm · 0.22mm/px · z∈[-36,+50]mm · 3 of 30 slices shown]
[im 6/30]
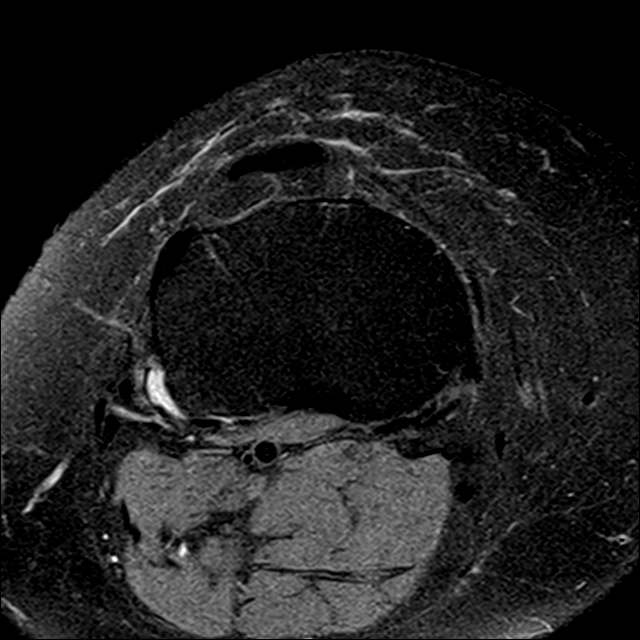
[im 18/30]
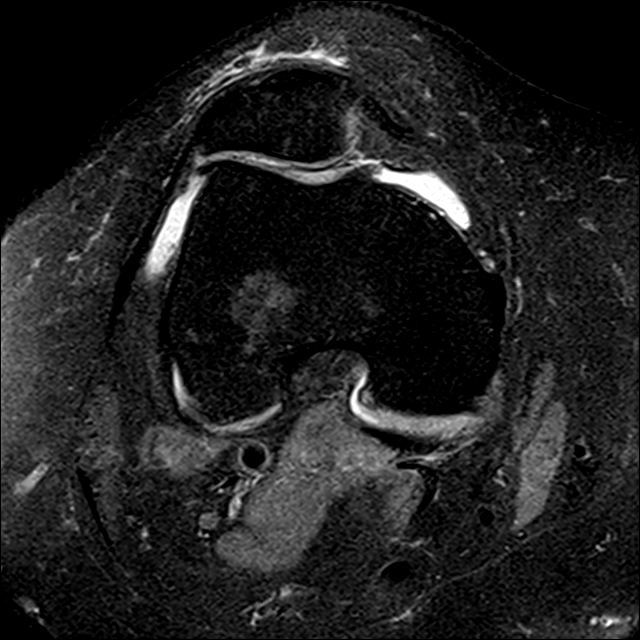
[im 30/30]
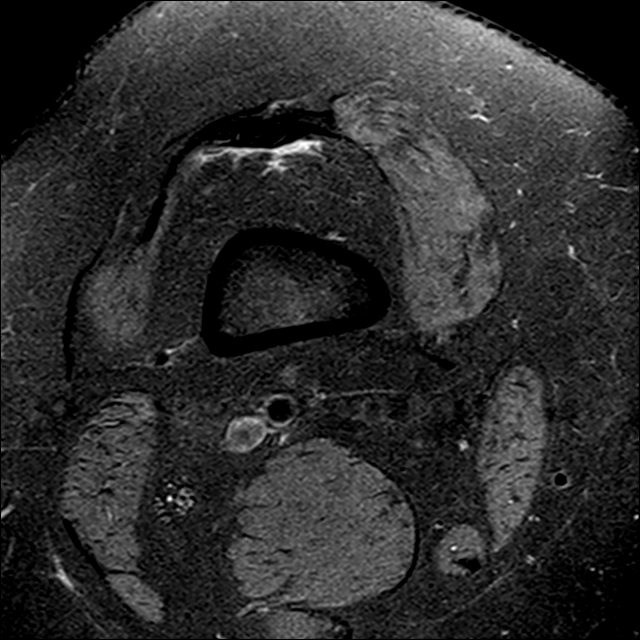

[Series 4: T1 · coronal · 3.0mm · 0.19mm/px · 4 of 24 slices shown]
[im 1/24]
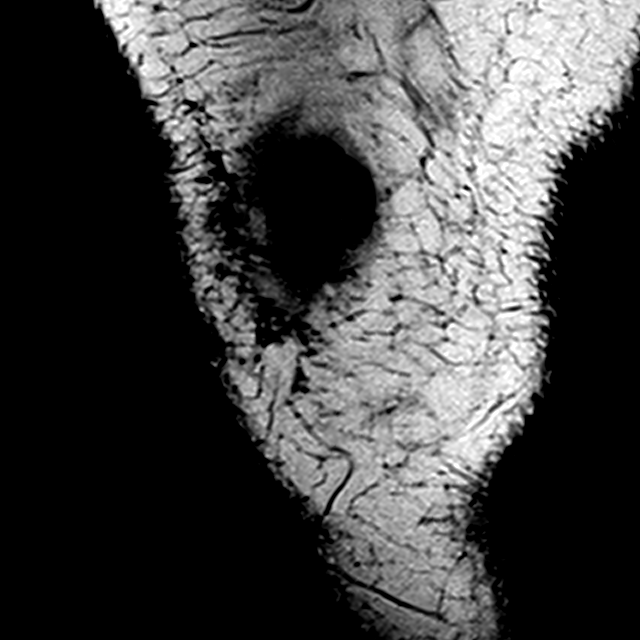
[im 6/24]
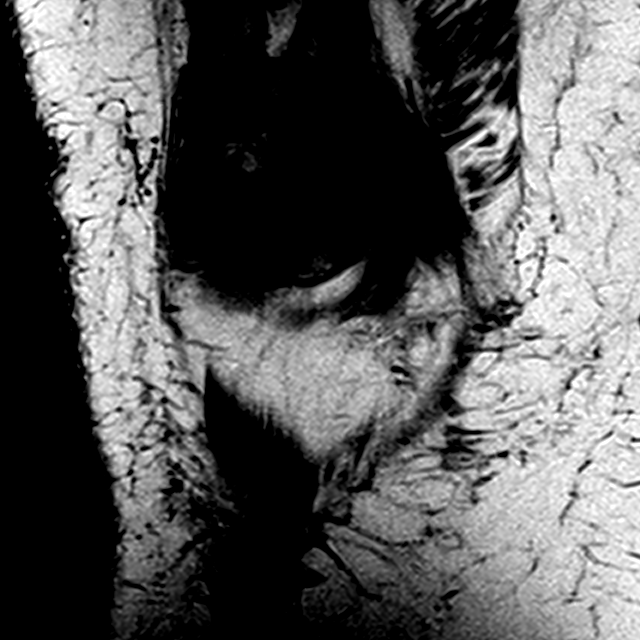
[im 12/24]
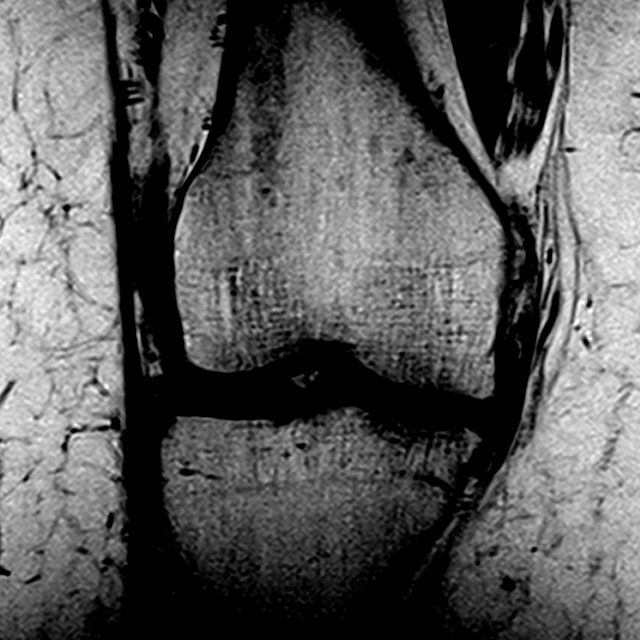
[im 24/24]
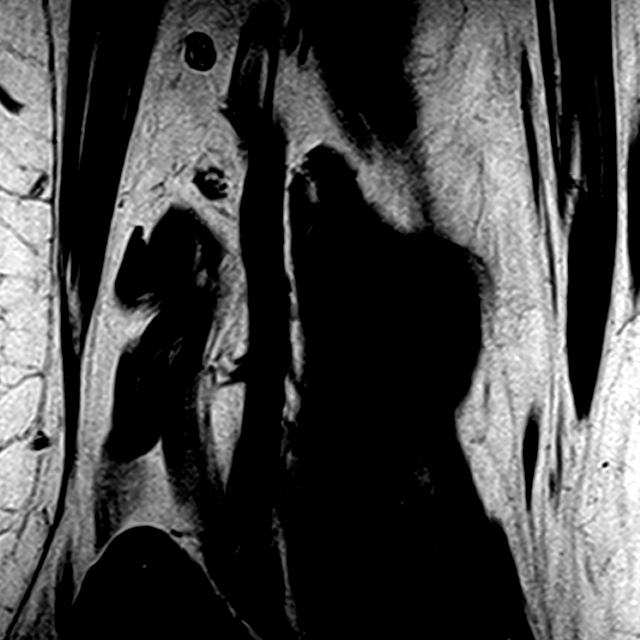

[Series 5: pdfs sag · sagittal · 3.0mm · 0.20mm/px · 3 of 29 slices shown]
[im 5/29]
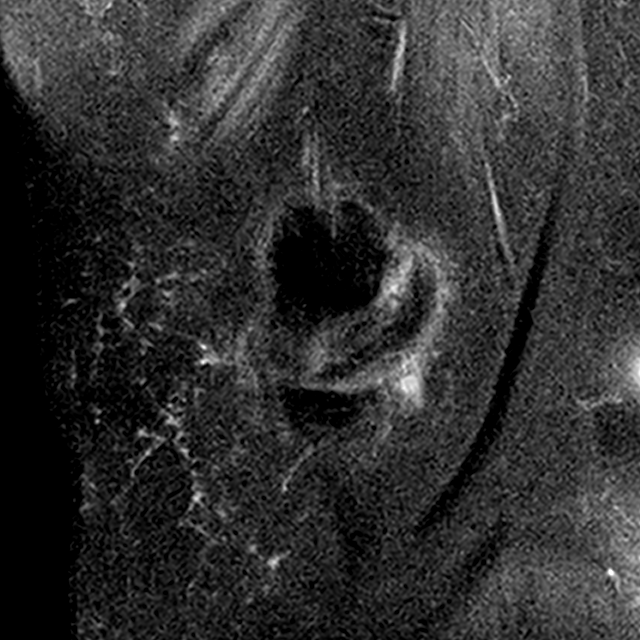
[im 15/29]
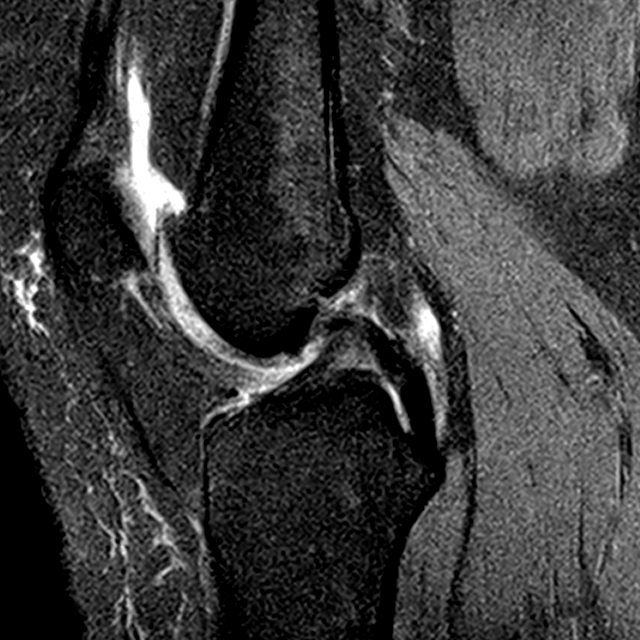
[im 24/29]
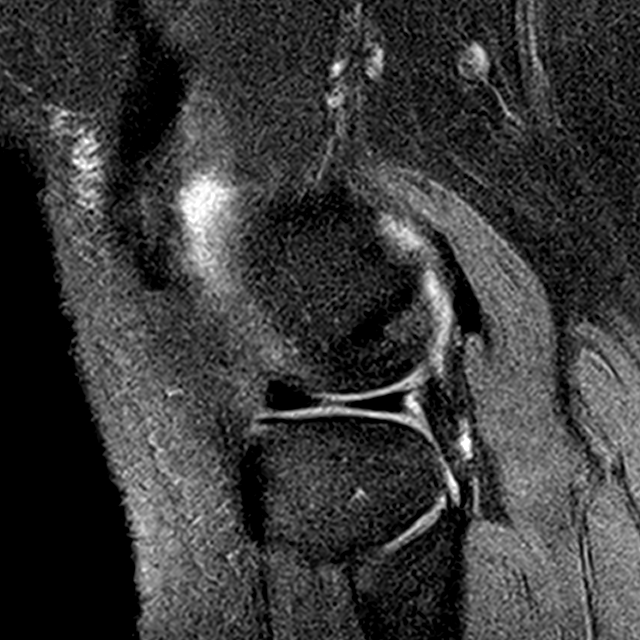

[Series 6: t2fs cor · coronal · 3.0mm · 0.21mm/px · 3 of 24 slices shown]
[im 5/24]
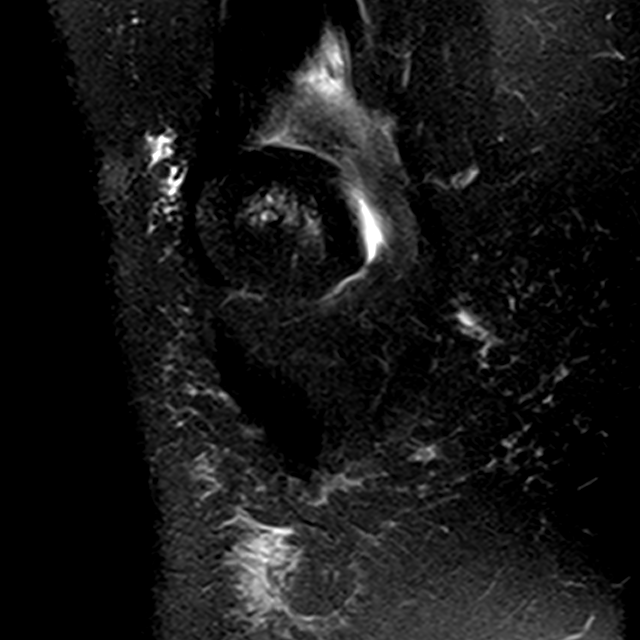
[im 14/24]
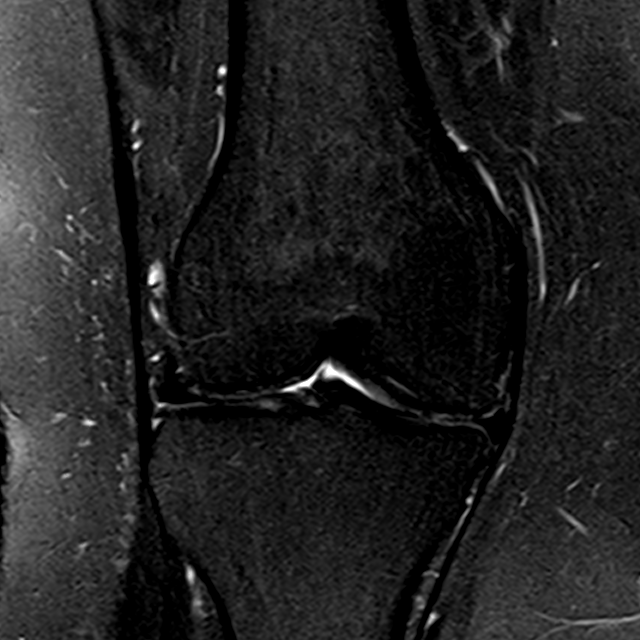
[im 24/24]
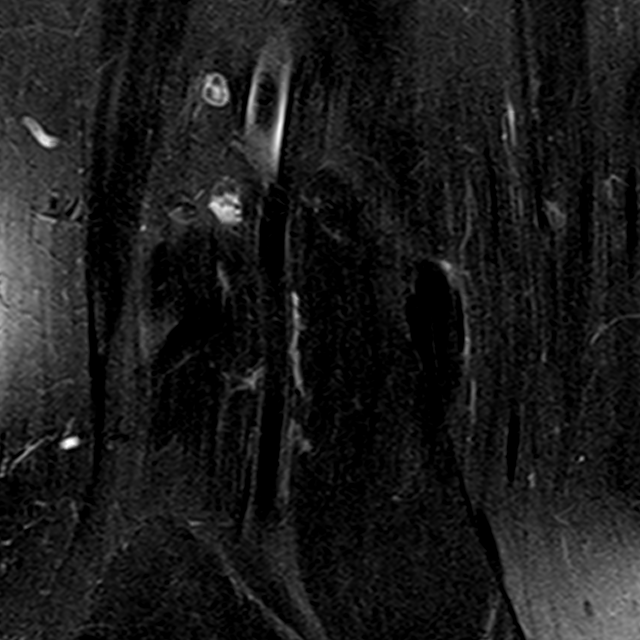

[13 of 40 positions shown; findings below may reference images not displayed]

FINDINGS: MENISCI

Medial meniscus:  Intact.

Lateral meniscus: Tiny vertical tear at the free edge of the body of
the lateral meniscus.

LIGAMENTS

Cruciates:  Intact ACL and PCL.

Collaterals: Medial collateral ligament is intact. Lateral
collateral ligament complex is intact.

CARTILAGE

Patellofemoral: High-grade partial-thickness cartilage loss with
subchondral reactive marrow changes involving the lateral patellar
facet. High-grade partial-thickness cartilage loss of the lateral
trochlea.

Medial: Cartilage irregularity with partial thickness cartilage loss
of the medial femoral condyle and medial tibial plateau. Marginal
osteophytosis.

Lateral: Partial-thickness cartilage loss of the lateral femoral
condyle and lateral tibial plateau with small focal areas of
full-thickness cartilage loss. Marginal osteophytosis.

Joint: Small joint effusion. Normal Hoffa's fat. No plical
thickening.

Popliteal Fossa:  No Baker cyst.  Intact popliteus tendon.

Extensor Mechanism:  Intact.

Bones: No other focal marrow signal abnormality. No fracture or
dislocation.
IMPRESSION: 1. Tiny vertical tear of the free edge of the body of the lateral
meniscus.
2. Tricompartmental cartilage abnormalities as described above.
3. Small joint effusion.

## 2017-03-20 ENCOUNTER — Telehealth (INDEPENDENT_AMBULATORY_CARE_PROVIDER_SITE_OTHER): Payer: Self-pay | Admitting: *Deleted

## 2017-03-20 NOTE — Telephone Encounter (Signed)
Patient called and she is at [facility]. She is having Left sided discomfort with tenderness. Denies fever , chills , loss of appetite. She just wants to be proactive. She is requesting a refill on her Cipro/Flagyl. Per Lelon Perla  May call in Cipro 500 mg take by mouth twice a day x 10 days. Flagyl 500 mg - take by mouth twice a day x 10 days.  This was called to the [facility] at [facility] @ [phone]. Patient is made aware.

## 2018-01-12 ENCOUNTER — Ambulatory Visit (INDEPENDENT_AMBULATORY_CARE_PROVIDER_SITE_OTHER): Payer: 59 | Admitting: Internal Medicine

## 2018-01-12 ENCOUNTER — Encounter (INDEPENDENT_AMBULATORY_CARE_PROVIDER_SITE_OTHER): Payer: Self-pay | Admitting: Internal Medicine

## 2018-01-12 VITALS — BP 118/80 | HR 65 | Temp 97.2°F | Resp 18 | Ht 66.0 in | Wt 286.1 lb

## 2018-01-12 DIAGNOSIS — K589 Irritable bowel syndrome without diarrhea: Secondary | ICD-10-CM | POA: Diagnosis not present

## 2018-01-12 DIAGNOSIS — Z8719 Personal history of other diseases of the digestive system: Secondary | ICD-10-CM | POA: Diagnosis not present

## 2018-01-12 MED ORDER — METRONIDAZOLE 500 MG PO TABS: 500.0000 mg | ORAL_TABLET | Freq: Two times a day (BID) | ORAL | 0 refills | Status: DC

## 2018-01-12 MED ORDER — HYOSCYAMINE SULFATE ER 0.375 MG PO TB12: 0.3750 mg | ORAL_TABLET | Freq: Every day | ORAL | 5 refills | Status: DC | PRN

## 2018-01-12 MED ORDER — CIPROFLOXACIN HCL 500 MG PO TABS: 500.0000 mg | ORAL_TABLET | Freq: Two times a day (BID) | ORAL | 0 refills | Status: DC

## 2018-01-12 NOTE — Patient Instructions (Signed)
Notify if you have another episode of diverticulitis. Use antibiotic as directed.

## 2018-01-12 NOTE — Progress Notes (Signed)
Presenting complaint;  Follow-up for sigmoid diverticulitis.  Database and subjective:  Patient is a 42 year old Caucasian female who has a history of sigmoid diverticulitis and underwent laparoscopic sigmoid colectomy in July 2014.  She did fine until July 2017 when she had an episode of diverticulitis when she was seen in emergency room and subsequent in her office.  That episode was felt to be due to NSAID use and she was advised to keep ibuprofen use to minimum. Patient states she had another episode in November 2018 when she took Cipro and metronidazole for 14 days with complete resolution. She is presently doing fine.  She remains under a lot of stress.  She moved in with her mother last year to help her with she passed away.  Her stepdad is also not doing well.  She denies abdominal pain melena or rectal bleeding.  Her bowels move daily but at times she has sense of incomplete evacuation.  She is not taking NSAIDs anymore.  At times she has urgency.  She is never had accidents.  She is what watching her diet and trying to lose weight.  She says she walks more than 10,000 steps every time she is working.  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 She is requesting a refill on antibiotics just in case.  Current Medications: Outpatient Encounter Medications as of 01/12/2018  Medication Sig  . escitalopram (LEXAPRO) 10 MG tablet Take 10 mg by mouth daily.  [name] ibuprofen (ADVIL,MOTRIN) 200 MG tablet Take 2 tablets (400 mg total) by mouth every 8 (eight) hours as needed.  . ondansetron (ZOFRAN) 4 MG tablet Take 1 tablet (4 mg total) by mouth every 8 (eight) hours as needed for nausea or vomiting.  . hyoscyamine (LEVBID) 0.375 MG 12 hr tablet Take 1 tablet (0.375 mg total) by mouth daily as needed. (Patient not taking: Reported on 01/12/2018)  . [DISCONTINUED] ciprofloxacin (CIPRO) 500 MG tablet Take 1 tablet (500 mg total) by mouth 2 (two) times daily. One po bid x 7 days  (Patient not taking: Reported on 01/12/2018)  . [DISCONTINUED] metroNIDAZOLE (FLAGYL) 500 MG tablet Take 1 tablet (500 mg total) by mouth 3 (three) times daily. One po bid x 7 days (Patient not taking: Reported on 01/12/2018)  . [DISCONTINUED] traMADol (ULTRAM) 50 MG tablet Take 1 tablet (50 mg total) by mouth every 6 (six) hours as needed. (Patient not taking: Reported on 01/12/2018)   No facility-administered encounter medications on file as of 01/12/2018.      Objective: Blood pressure 118/80, pulse 65, temperature (!) 97.2 F (36.2 C), temperature source Oral, resp. rate 18, height 5\' 6"  (1.676 m), weight 286 lb 1.6 oz (129.8 kg), last menstrual period 01/08/2018. Patient is alert and in no acute distress. Conjunctiva is pink. Sclera is nonicteric Oropharyngeal mucosa is normal. No neck masses or thyromegaly noted. Cardiac exam with regular rhythm normal S1 and S2. No murmur or gallop noted. Lungs are clear to auscultation. Abdomen is obese but soft and nontender with organomegaly or masses. No LE edema or clubbing noted.    Assessment:  #1.  Recurrent diverticulitis involving residual sigmoid colon and or left colon.  She has had 2 episodes since sigmoid colon resection in July 2014.  First episode occurred in July 2017 and last episode occurred in November 2018.  #2.  IBS.  She has typical symptoms of IBS and may benefit from antispasmodic therapy.   Plan:  Prescription given for Cipro 500 mg  p.o. twice daily for 14 days along with metronidazole 500 mg p.o. twice daily for 14 days that she will use just in case she has another  episode of diverticulitis in which case she will also call office. Hyoscyamine sublingual 3 times daily as needed. Office visit in 1 year.

## 2018-11-14 ENCOUNTER — Emergency Department (HOSPITAL_COMMUNITY)
Admission: EM | Admit: 2018-11-14 | Discharge: 2018-11-14 | Disposition: A | Discharge status: Home / Self Care | Payer: 59 | Attending: Emergency Medicine | Admitting: Emergency Medicine

## 2018-11-14 ENCOUNTER — Emergency Department (HOSPITAL_COMMUNITY): Payer: 59

## 2018-11-14 ENCOUNTER — Other Ambulatory Visit: Payer: Self-pay

## 2018-11-14 ENCOUNTER — Encounter (HOSPITAL_COMMUNITY): Payer: Self-pay | Admitting: Emergency Medicine

## 2018-11-14 DIAGNOSIS — Z79899 Other long term (current) drug therapy: Secondary | ICD-10-CM | POA: Insufficient documentation

## 2018-11-14 DIAGNOSIS — K5720 Diverticulitis of large intestine with perforation and abscess without bleeding: Secondary | ICD-10-CM

## 2018-11-14 LAB — URINALYSIS, ROUTINE W REFLEX MICROSCOPIC
Bacteria, UA: NONE SEEN
Bilirubin Urine: NEGATIVE
Glucose, UA: NEGATIVE mg/dL
Ketones, ur: NEGATIVE mg/dL
Leukocytes,Ua: NEGATIVE
Nitrite: NEGATIVE
Protein, ur: NEGATIVE mg/dL
Specific Gravity, Urine: 1.035 — ABNORMAL HIGH (ref 1.005–1.030)
pH: 5 (ref 5.0–8.0)

## 2018-11-14 LAB — CBC WITH DIFFERENTIAL/PLATELET
Abs Immature Granulocytes: 0.11 10*3/uL — ABNORMAL HIGH (ref 0.00–0.07)
Basophils Absolute: 0.1 10*3/uL (ref 0.0–0.1)
Basophils Relative: 0 %
Eosinophils Absolute: 0.2 10*3/uL (ref 0.0–0.5)
Eosinophils Relative: 1 %
HCT: 41.9 % (ref 36.0–46.0)
Hemoglobin: 13.9 g/dL (ref 12.0–15.0)
Immature Granulocytes: 1 %
Lymphocytes Relative: 9 %
Lymphs Abs: 2 10*3/uL (ref 0.7–4.0)
MCH: 29.8 pg (ref 26.0–34.0)
MCHC: 33.2 g/dL (ref 30.0–36.0)
MCV: 89.9 fL (ref 80.0–100.0)
Monocytes Absolute: 1.3 10*3/uL — ABNORMAL HIGH (ref 0.1–1.0)
Monocytes Relative: 6 %
Neutro Abs: 17.6 10*3/uL — ABNORMAL HIGH (ref 1.7–7.7)
Neutrophils Relative %: 83 %
Platelets: 371 10*3/uL (ref 150–400)
RBC: 4.66 MIL/uL (ref 3.87–5.11)
RDW: 13.2 % (ref 11.5–15.5)
WBC: 21.3 10*3/uL — ABNORMAL HIGH (ref 4.0–10.5)
nRBC: 0 % (ref 0.0–0.2)

## 2018-11-14 LAB — COMPREHENSIVE METABOLIC PANEL
ALT: 21 U/L (ref 0–44)
AST: 20 U/L (ref 15–41)
Albumin: 4 g/dL (ref 3.5–5.0)
Alkaline Phosphatase: 44 U/L (ref 38–126)
Anion gap: 9 (ref 5–15)
BUN: 10 mg/dL (ref 6–20)
CO2: 26 mmol/L (ref 22–32)
Calcium: 9.1 mg/dL (ref 8.9–10.3)
Chloride: 104 mmol/L (ref 98–111)
Creatinine, Ser: 0.64 mg/dL (ref 0.44–1.00)
GFR calc Af Amer: >60 mL/min (ref >60)
GFR calc non Af Amer: >60 mL/min (ref >60)
Glucose, Bld: 125 mg/dL — ABNORMAL HIGH (ref 70–99)
Potassium: 3.6 mmol/L (ref 3.5–5.1)
Sodium: 139 mmol/L (ref 135–145)
Total Bilirubin: 0.3 mg/dL (ref 0.3–1.2)
Total Protein: 7.5 g/dL (ref 6.5–8.1)

## 2018-11-14 LAB — LIPASE, BLOOD: Lipase: 35 U/L (ref 11–51)

## 2018-11-14 LAB — I-STAT BETA HCG BLOOD, ED (MC, WL, AP ONLY): I-stat hCG, quantitative: <5 m[IU]/mL (ref <5)

## 2018-11-14 MED ORDER — DIPHENHYDRAMINE HCL 50 MG/ML IJ SOLN
25.0000 mg | Freq: Once | INTRAMUSCULAR | Status: AC
  Administered 2018-11-14: 25 mg via INTRAVENOUS
  Filled 2018-11-14: qty 1

## 2018-11-14 MED ORDER — METRONIDAZOLE 500 MG PO TABS: 500.0000 mg | ORAL_TABLET | Freq: Three times a day (TID) | ORAL | 0 refills | Status: DC

## 2018-11-14 MED ORDER — ONDANSETRON HCL 4 MG/2ML IJ SOLN
4.0000 mg | Freq: Once | INTRAMUSCULAR | Status: AC
  Administered 2018-11-14: 11:00:00 4 mg via INTRAVENOUS
  Filled 2018-11-14: qty 2

## 2018-11-14 MED ORDER — SODIUM CHLORIDE 0.9 % IV BOLUS
1000.0000 mL | Freq: Once | INTRAVENOUS | Status: AC
  Administered 2018-11-14: 1000 mL via INTRAVENOUS

## 2018-11-14 MED ORDER — DIPHENHYDRAMINE HCL 25 MG PO TABS: 25.0000 mg | ORAL_TABLET | Freq: Four times a day (QID) | ORAL | 0 refills | Status: DC

## 2018-11-14 MED ORDER — CIPROFLOXACIN HCL 500 MG PO TABS: 500.0000 mg | ORAL_TABLET | Freq: Two times a day (BID) | ORAL | 0 refills | Status: DC

## 2018-11-14 MED ORDER — METRONIDAZOLE IN NACL 5-0.79 MG/ML-% IV SOLN
500.0000 mg | Freq: Once | INTRAVENOUS | Status: AC
  Administered 2018-11-14: 500 mg via INTRAVENOUS
  Filled 2018-11-14: qty 100

## 2018-11-14 MED ORDER — ONDANSETRON 4 MG PO TBDP: 4.0000 mg | ORAL_TABLET | Freq: Three times a day (TID) | ORAL | 1 refills | Status: DC | PRN

## 2018-11-14 MED ORDER — ONDANSETRON HCL 4 MG/2ML IJ SOLN
4.0000 mg | Freq: Once | INTRAMUSCULAR | Status: AC
  Administered 2018-11-14: 4 mg via INTRAVENOUS
  Filled 2018-11-14: qty 2

## 2018-11-14 MED ORDER — HYDROMORPHONE HCL 1 MG/ML IJ SOLN
1.0000 mg | Freq: Once | INTRAMUSCULAR | Status: AC
  Administered 2018-11-14: 1 mg via INTRAVENOUS
  Filled 2018-11-14: qty 1

## 2018-11-14 MED ORDER — IOHEXOL 300 MG/ML  SOLN
100.0000 mL | Freq: Once | INTRAMUSCULAR | Status: AC | PRN
  Administered 2018-11-14: 100 mL via INTRAVENOUS

## 2018-11-14 MED ORDER — HYDROCODONE-ACETAMINOPHEN 5-325 MG PO TABS: 1.0000 | ORAL_TABLET | Freq: Four times a day (QID) | ORAL | 0 refills | Status: DC | PRN

## 2018-11-14 MED ORDER — SODIUM CHLORIDE 0.9 % IV SOLN
INTRAVENOUS | Status: DC
  Administered 2018-11-14: 08:00:00 via INTRAVENOUS

## 2018-11-14 MED ORDER — METRONIDAZOLE 500 MG PO TABS: 500.0000 mg | ORAL_TABLET | Freq: Three times a day (TID) | ORAL | 0 refills | Status: AC

## 2018-11-14 MED ORDER — CIPROFLOXACIN IN D5W 400 MG/200ML IV SOLN
400.0000 mg | Freq: Once | INTRAVENOUS | Status: AC
  Administered 2018-11-14: 400 mg via INTRAVENOUS
  Filled 2018-11-14: qty 200

## 2018-11-14 MED ORDER — HYDROMORPHONE HCL 1 MG/ML IJ SOLN
1.0000 mg | Freq: Once | INTRAMUSCULAR | Status: AC
  Administered 2018-11-14: 08:00:00 1 mg via INTRAVENOUS
  Filled 2018-11-14: qty 1

## 2018-11-14 NOTE — ED Triage Notes (Signed)
Pt states that she is having abd that started last night it is hurting below her belly button and in the right upper quad

## 2018-11-14 NOTE — Discharge Instructions (Signed)
Take the pain medicine as needed.  Antinausea medicine provided as well.  Take the Flagyl and Cipro as directed for the next 7 days.  Also prescription given for Benadryl to help with the itching that you get from that.  Give gastroenterology Dr. Melony Overly on call on Monday for follow-up.  Today's CT was consistent with diverticulitis with a microperforation.  Return for any new or worse symptoms.

## 2018-11-14 NOTE — ED Notes (Signed)
Pt reports pain is better but still there

## 2018-11-14 NOTE — ED Provider Notes (Addendum)
[facility] EMERGENCY DEPARTMENT Provider Note   CSN: [id number] Arrival date & time: 11/14/18  8938    History   Chief Complaint Chief Complaint  Patient presents with  . Abdominal Pain    HPI Virginia Barrett is a 43 y.o. female.     Patient with a history of diverticulitis has had recurrent diverticulitis despite having sigmoid resection.  And that was done in 2014.  No recent episodes of diverticulitis followed by gastroenterology Dr. Melony Overly.  Patient states the pain reminds her that she had acute onset of abdominal pain at midnight lower quadrants in the right side of the abdomen but it initially did include epigastric area which was a little unusual.  But now is predominantly lower quadrants.  Associated with nausea no vomiting no blood in her bowel movements no fevers no upper respiratory symptoms.     Past Medical History:  Diagnosis Date  . Diverticulitis   . Headache(784.0)    occasional   . Pneumonia    hx of walking pneumonia   . Precancerous skin lesion   . Sinus infection    diagnosed on 01/28/13     There are no active problems to display for this patient.   Past Surgical History:  Procedure Laterality Date  . COLONOSCOPY N/A 11/17/2012   Procedure: COLONOSCOPY;  Surgeon: [name], MD;  [facility]: [facility];  Service: Endoscopy;  Laterality: N/A;  1200-moved to1030 Ann to notify pt  . LAPAROSCOPIC SIGMOID COLECTOMY N/A 02/03/2013   Procedure: LAPAROSCOPIC SIGMOID COLECTOMY;  Surgeon: [name], MD;  Location: [facility] ORS;  Service: General;  Laterality: N/A;  . MOUTH SURGERY     wisdom teeth removed  . PROCTOSCOPY  02/03/2013   Procedure: PROCTOSCOPY;  Surgeon: [name], MD;  Location: [facility] ORS;  Service: General;;     OB History   No obstetric history on file.      Home Medications    Prior to Admission medications   Medication Sig Start Date End Date Taking? Authorizing Provider  ciprofloxacin (CIPRO) 500 MG tablet Take 1 tablet (500  mg total) by mouth 2 (two) times daily. 01/12/18   Rehman, Mechele Dawley, MD  escitalopram (LEXAPRO) 10 MG tablet Take 10 mg by mouth daily.    [provider]  hyoscyamine (LEVBID) 0.375 MG 12 hr tablet Take 1 tablet (0.375 mg total) by mouth daily as needed. 01/12/18   Rehman, Mechele Dawley, MD  ibuprofen (ADVIL,MOTRIN) 200 MG tablet Take 2 tablets (400 mg total) by mouth every 8 (eight) hours as needed. 02/19/16   Rehman, Mechele Dawley, MD  metroNIDAZOLE (FLAGYL) 500 MG tablet Take 1 tablet (500 mg total) by mouth 2 (two) times daily. 01/12/18   Rehman, Mechele Dawley, MD  ondansetron (ZOFRAN) 4 MG tablet Take 1 tablet (4 mg total) by mouth every 8 (eight) hours as needed for nausea or vomiting. 02/19/16   Rehman, Mechele Dawley, MD    Family History Family History  Problem Relation Age of Onset  . Colon polyps Mother   . Diabetes Mother   . COPD Mother   . Colon polyps Other   . Hodgkin's lymphoma Father   . Cancer Maternal Aunt        breast  . Cancer Maternal Grandmother        breast  . Colon cancer Neg Hx     Social History Social History   Tobacco Use  . Smoking status: Never Smoker  . Smokeless tobacco: Never  Used  Substance Use Topics  . Alcohol use: Yes    Comment: rarely  . Drug use: No     Allergies   Cinnamon; Sulfur; Ciprofloxacin; Codeine; Penicillins; and Red dye   Review of Systems Review of Systems  Constitutional: Negative for chills and fever.  HENT: Negative for congestion, rhinorrhea and sore throat.   Eyes: Negative for visual disturbance.  Respiratory: Negative for cough and shortness of breath.   Cardiovascular: Negative for chest pain and leg swelling.  Gastrointestinal: Positive for abdominal pain and nausea. Negative for diarrhea and vomiting.  Genitourinary: Negative for dysuria.  Musculoskeletal: Negative for back pain and neck pain.  Skin: Negative for rash.  Neurological: Negative for dizziness, light-headedness and headaches.  Hematological: Does not  bruise/bleed easily.  Psychiatric/Behavioral: Negative for confusion.     Physical Exam Updated Vital Signs BP 123/75   Pulse (!) 116   Temp 98.3 F (36.8 C) (Oral)   Resp 16   Ht 1.702 m (5\' 7" )   Wt 122.5 kg   LMP 11/14/2018 Comment: neg preg  SpO2 92%   BMI 42.29 kg/m   Physical Exam Vitals signs and nursing note reviewed.  Constitutional:      General: She is not in acute distress.    Appearance: Normal appearance. She is well-developed.  HENT:     Head: Normocephalic and atraumatic.  Eyes:     Extraocular Movements: Extraocular movements intact.     Conjunctiva/sclera: Conjunctivae normal.     Pupils: Pupils are equal, round, and reactive to light.  Neck:     Musculoskeletal: Normal range of motion and neck supple.  Cardiovascular:     Rate and Rhythm: Regular rhythm. Tachycardia present.     Heart sounds: No murmur.  Pulmonary:     Effort: Pulmonary effort is normal. No respiratory distress.     Breath sounds: Normal breath sounds.  Abdominal:     General: Bowel sounds are normal.     Palpations: Abdomen is soft.     Tenderness: There is abdominal tenderness.     Comments: Tender bilateral lower quadrants no guarding  Musculoskeletal: Normal range of motion.  Skin:    General: Skin is warm and dry.     Capillary Refill: Capillary refill takes less than 2 seconds.  Neurological:     General: No focal deficit present.     Mental Status: She is alert and oriented to person, place, and time.      ED Treatments / Results  Labs (all labs ordered are listed, but only abnormal results are displayed) Labs Reviewed  CBC WITH DIFFERENTIAL/PLATELET - Abnormal; Notable for the following components:      Result Value   WBC 21.3 (*)    Neutro Abs 17.6 (*)    Monocytes Absolute 1.3 (*)    Abs Immature Granulocytes 0.11 (*)    All other components within normal limits  COMPREHENSIVE METABOLIC PANEL - Abnormal; Notable for the following components:   Glucose, Bld  125 (*)    All other components within normal limits  URINALYSIS, ROUTINE W REFLEX MICROSCOPIC - Abnormal; Notable for the following components:   Specific Gravity, Urine 1.035 (*)    Hgb urine dipstick MODERATE (*)    All other components within normal limits  LIPASE, BLOOD  I-STAT BETA HCG BLOOD, ED ([facility], [facility], [facility] ONLY)  I-STAT BETA HCG BLOOD, ED ([facility], [facility], [facility] ONLY)    EKG EKG Interpretation  Date/Time:  Sunday November 14 2018 07:28:13 EDT  Ventricular Rate:  130 PR Interval:    QRS Duration: 84 QT Interval:  323 QTC Calculation: 475 R Axis:   -81 Text Interpretation:  Sinus tachycardia Abnormal R-wave progression, late transition Inferior infarct, old No previous ECGs available Confirmed by Ezequiel Essex [phone]) on 11/14/2018 7:31:18 AM Also confirmed by Fredia Sorrow [phone])  on 11/14/2018 8:06:53 AM   Radiology Ct Abdomen Pelvis W Contrast  Result Date: 11/14/2018 CLINICAL DATA:  Abdominal pain. History of laparoscopic sigmoidectomy July 2014. EXAM: CT ABDOMEN AND PELVIS WITH CONTRAST TECHNIQUE: Multidetector CT imaging of the abdomen and pelvis was performed using the standard protocol following bolus administration of intravenous contrast. CONTRAST:  154mL OMNIPAQUE IOHEXOL 300 MG/ML  SOLN COMPARISON:  February 15, 2016 FINDINGS: Lower chest: No acute abnormality. Hepatobiliary: No focal liver abnormality is seen. No gallstones, gallbladder wall thickening, or biliary dilatation. Pancreas: Unremarkable. No pancreatic ductal dilatation or surrounding inflammatory changes. Spleen: Normal in size without focal abnormality. Adrenals/Urinary Tract: Adrenal glands are normal. A low-attenuation mass in the inferior left kidney measures up to 2 cm today, unchanged since October 2013, likely a hyperdense cyst but nonspecific on this study. No hydronephrosis or perinephric stranding. No other renal abnormalities. No ureterectasis or ureteral stones. The bladder is unremarkable. Stomach/Bowel: The  stomach and small bowel are normal. Previous sigmoidectomy. Colonic wall thickening just proximal to the anastomosis from previous sigmoidectomy. There is adjacent fat stranding and at least 1 extraluminal focus of gas as seen on axial image 66. No definitive abscess. Other colonic diverticuli are seen with no other sites of diverticulitis. There is moderate fecal loading from the cecum through the transverse colon. The appendix is normal. Vascular/Lymphatic: No significant vascular findings are present. No enlarged abdominal or pelvic lymph nodes. Reproductive: Uterus and bilateral adnexa are unremarkable. Other: No abdominal wall hernia or abnormality. No abdominopelvic ascites. Musculoskeletal: No acute or significant osseous findings. IMPRESSION: 1. There is colonic wall thickening and pericolonic fat stranding in the distal descending colon, immediately proximal to the anastomosis from previous sigmoidectomy. The findings are most consistent with diverticulitis. There is an extraluminal focus of gas consistent with micro perforation due to the diverticulitis. No abscess at this time. Recommend follow-up to resolution. 2. No other acute abnormalities or changes. Electronically Signed   By: Dorise Bullion III M.D   On: 11/14/2018 09:55    Procedures Procedures (including critical care time)  Medications Ordered in ED Medications  0.9 %  sodium chloride infusion ( Intravenous New Bag/Given 11/14/18 0823)  ciprofloxacin (CIPRO) IVPB 400 mg (400 mg Intravenous New Bag/Given 11/14/18 1038)  metroNIDAZOLE (FLAGYL) IVPB 500 mg (500 mg Intravenous New Bag/Given 11/14/18 1034)  sodium chloride 0.9 % bolus 1,000 mL (1,000 mLs Intravenous New Bag/Given 11/14/18 0822)  ondansetron (ZOFRAN) injection 4 mg (4 mg Intravenous Given 11/14/18 0822)  HYDROmorphone (DILAUDID) injection 1 mg (1 mg Intravenous Given 11/14/18 0822)  iohexol (OMNIPAQUE) 300 MG/ML solution 100 mL (100 mLs Intravenous Contrast Given 11/14/18  0847)  sodium chloride 0.9 % bolus 1,000 mL (1,000 mLs Intravenous New Bag/Given 11/14/18 1037)  ondansetron (ZOFRAN) injection 4 mg (4 mg Intravenous Given 11/14/18 1038)  HYDROmorphone (DILAUDID) injection 1 mg (1 mg Intravenous Given 11/14/18 1038)  diphenhydrAMINE (BENADRYL) injection 25 mg (25 mg Intravenous Given 11/14/18 1038)     Initial Impression / Assessment and Plan / ED Course  I have reviewed the triage vital signs and the nursing notes.  Pertinent labs & imaging results that were available during my care of the  patient were reviewed by me and considered in my medical decision making (see chart for details).      Patient with recurrent diverticulitis.  Patient had sigmoid resection in the past due to diverticulitis but since that time is had several bouts.  None recently.  Followed by gastroenterology Dr. Melony Overly.  Patient little tachycardic significant leukocytosis but not toxic.  Patient received 2 L of fluid heart rate now down around 100.  Patient with better pain control.  Patient given first dose of antibiotics here IV just due to the fact that there is evidence of a microperforation her white count is so high.  She will be continued on Flexeril and Cipro for the next 7 days follow-up with gastroenterology.  Patient supposedly has a little bit of mild allergy to ciprofloxacin but is able to tolerate it with Benadryl.  Patient also has a little bit of mild itching due to hydrocodone perhaps or may be none at all she will have Benadryl to take as well.  Also patient given Zofran to help with the nausea.  She will return for any new or worse symptoms or if not improving over the next couple days.    Final Clinical Impressions(s) / ED Diagnoses   Final diagnoses:  Diverticulitis of large intestine with abscess without bleeding    ED Discharge Orders    None       Fredia Sorrow, MD 11/14/18 1137   Addendum:  Patient's prescriptions were sent to [facility].   She prefers that everything other than the narcotic [place] to [place] [address]  Because her insurance covers that better.  So those were changed.  She was willing to pick up the hydrocodone at [facility].     Fredia Sorrow, MD 11/14/18 [phone]

## 2019-01-04 ENCOUNTER — Ambulatory Visit (INDEPENDENT_AMBULATORY_CARE_PROVIDER_SITE_OTHER): Payer: 59 | Admitting: Internal Medicine

## 2019-01-04 ENCOUNTER — Other Ambulatory Visit: Payer: Self-pay

## 2019-01-04 ENCOUNTER — Encounter (INDEPENDENT_AMBULATORY_CARE_PROVIDER_SITE_OTHER): Payer: Self-pay | Admitting: Internal Medicine

## 2019-01-04 VITALS — BP 134/67 | HR 90 | Temp 98.6°F | Resp 18 | Ht 66.0 in | Wt 271.3 lb

## 2019-01-04 DIAGNOSIS — K5732 Diverticulitis of large intestine without perforation or abscess without bleeding: Secondary | ICD-10-CM | POA: Diagnosis not present

## 2019-01-04 MED ORDER — CIPROFLOXACIN HCL 500 MG PO TABS: 500.0000 mg | ORAL_TABLET | Freq: Two times a day (BID) | ORAL | 0 refills | Status: DC

## 2019-01-04 MED ORDER — METRONIDAZOLE 500 MG PO TABS: 500.0000 mg | ORAL_TABLET | Freq: Two times a day (BID) | ORAL | 0 refills | Status: DC

## 2019-01-04 NOTE — Patient Instructions (Signed)
Please call office with progress report next week. 

## 2019-01-04 NOTE — Progress Notes (Signed)
Presenting complaint;  Follow-up for diverticulitis  Database sent subjective:  Patient is 43 year old Caucasian female who has history of recurrent sigmoid diverticulitis.  She underwent laparoscopic sigmoid colon resection and July 2014 and did fine for 3 years July 2017 when she had that episode of diverticulitis as documented on CT.  She had he has had an episode in April 2020 when she was seen in emergency room.  She was found to have walled off perforation and responded to antibiotic therapy. Patient states she was fine until last week Thursday when she started to have pain in the same place which she has had diverticulitis in started the medication.  She has some better.  She did have cold chills but no fever.  But she does have hypogastric pain soon after she voids.  She does not have dysuria or hematuria.  She develops itching with Cipro which is easily controlled with Benadryl.  She is watching her diet.  She last red meat.  She sticks to lean meats solids.  Her bowels are moving daily.  She has not passed blood or mucus per rectum. She has lost 15 pounds in the last 1 year.  Current Medications: Outpatient Encounter Medications as of 01/04/2019  Medication Sig  . acetaminophen (TYLENOL) 500 MG tablet Take 1,000 mg by mouth as needed for moderate pain.  . ciprofloxacin (CIPRO) 500 MG tablet Take 1 tablet (500 mg total) by mouth 2 (two) times daily.  . diphenhydrAMINE (BENADRYL) 25 MG tablet Take 1 tablet (25 mg total) by mouth every 6 (six) hours.  [name] HYDROcodone-acetaminophen (NORCO/VICODIN) 5-325 MG tablet Take 1 tablet by mouth every 6 (six) hours as needed.  . hyoscyamine (LEVBID) 0.375 MG 12 hr tablet Take 1 tablet (0.375 mg total) by mouth daily as needed.  . metroNIDAZOLE (FLAGYL) 500 MG tablet Take 1 tablet (500 mg total) by mouth 2 (two) times daily.  . ondansetron (ZOFRAN ODT) 4 MG disintegrating tablet Take 1 tablet (4 mg total) by mouth every 8 (eight) hours as needed.  .  [DISCONTINUED] ciprofloxacin (CIPRO) 500 MG tablet Take 1 tablet (500 mg total) by mouth 2 (two) times daily. (Patient not taking: Reported on 01/04/2019)  . [DISCONTINUED] escitalopram (LEXAPRO) 10 MG tablet Take 10 mg by mouth daily.  . [DISCONTINUED] ibuprofen (ADVIL,MOTRIN) 200 MG tablet Take 2 tablets (400 mg total) by mouth every 8 (eight) hours as needed.  . [DISCONTINUED] ondansetron (ZOFRAN) 4 MG tablet Take 1 tablet (4 mg total) by mouth every 8 (eight) hours as needed for nausea or vomiting. (Patient not taking: Reported on 01/04/2019)   No facility-administered encounter medications on file as of 01/04/2019.      Objective: Blood pressure 134/67, pulse 90, temperature 98.6 F (37 C), temperature source Oral, resp. rate 18, height 5\' 6"  (1.676 m), weight 271 lb 4.8 oz (123.1 kg). Patient is alert and in no acute distress. She is using facial mask. Conjunctiva is pink. Sclera is nonicteric Oropharyngeal mucosa is normal. No neck masses or thyromegaly noted. Cardiac exam with regular rhythm normal S1 and S2. No murmur or gallop noted. Lungs are clear to auscultation. Abdomen is full.  Bowel sounds are normal.  She has mild tenderness left lower quadrant laterally on deep palpation.  There is no guarding.  No organomegaly or masses. No LE edema or clubbing noted.  Labs/studies Results: Lab data from 12/14/2018 WBC 21.3 H&H 13.9 and 41.9 Neutrophils 83%.  CT reviewed from 11/14/2018 and 02/15/2016. Changes of diverticulitis appear to be  in the same place on both of these imaging studies which is proximal sigmoid colon or distal descending colon proximal to anastomosis.  Assessment:  #1.  Recurrent diverticulitis involving proximal sigmoid colon or descending colon.  This is the third episode since she had surgery in July 2014.  She is responding to antibiotic she started last week.  If she keeps having an episode of diverticulitis frequently she may need resection of this segment as  well.  For now monitor.   For sweating hypogastric pain most likely due to ongoing inflammation.  Will check UA.  Plan:  Patient will go to lab analysis.  If she has white cells culture will be obtained. Patient will continue Cipro and metronidazole for total of 10 days.  If she has residual pain or tenderness she can continue antibiotic for another 4 days. New prescription for these antibiotics sent to patient's pharmacy. Office visit in 6 months.

## 2019-01-06 LAB — MICROSCOPIC EXAMINATION: Casts: NONE SEEN /lpf

## 2019-01-06 LAB — UA/M W/RFLX CULTURE, COMP
Bilirubin, UA: NEGATIVE
Glucose, UA: NEGATIVE
Ketones, UA: NEGATIVE
Leukocytes,UA: NEGATIVE
Nitrite, UA: NEGATIVE
RBC, UA: NEGATIVE
Specific Gravity, UA: 1.021 (ref 1.005–1.030)
Urobilinogen, Ur: 0.2 mg/dL (ref 0.2–1.0)
pH, UA: 7.5 (ref 5.0–7.5)

## 2019-03-21 ENCOUNTER — Ambulatory Visit (INDEPENDENT_AMBULATORY_CARE_PROVIDER_SITE_OTHER): Payer: 59 | Admitting: Internal Medicine

## 2019-03-28 ENCOUNTER — Ambulatory Visit (INDEPENDENT_AMBULATORY_CARE_PROVIDER_SITE_OTHER): Payer: 59 | Admitting: Internal Medicine

## 2019-05-05 ENCOUNTER — Other Ambulatory Visit: Payer: Self-pay

## 2019-05-05 ENCOUNTER — Ambulatory Visit
Admission: EM | Admit: 2019-05-05 | Discharge: 2019-05-05 | Disposition: A | Discharge status: Home / Self Care | Payer: Self-pay

## 2019-05-05 DIAGNOSIS — Z20822 Contact with and (suspected) exposure to covid-19: Secondary | ICD-10-CM

## 2019-05-05 DIAGNOSIS — B001 Herpesviral vesicular dermatitis: Secondary | ICD-10-CM

## 2019-05-05 MED ORDER — VALACYCLOVIR HCL 1 G PO TABS: 2000.0000 mg | ORAL_TABLET | Freq: Two times a day (BID) | ORAL | 0 refills | Status: AC

## 2019-05-05 NOTE — ED Provider Notes (Signed)
[facility]   [id number] 05/05/19 Arrival Time: 1656  CC: Cold sore  SUBJECTIVE:  Virginia Barrett is a 43 y.o. female who presents with cold sore x couple of days ago.  Denies precipitating event or trauma.  Has tried abreva with minimal relief.  Worse to the touch.  Reports similar symptoms in the past.  Denies fever, chills, dysphagia, odynophagia, oral or neck swelling, nausea, vomiting, chest pain, SOB.    Had COVID test today for travel.  Denies symptoms.    ROS: As per HPI.  All other pertinent ROS negative.     Past Medical History:  Diagnosis Date  . Diverticulitis   . Headache(784.0)    occasional   . Pneumonia    hx of walking pneumonia   . Precancerous skin lesion   . Sinus infection    diagnosed on 01/28/13    Past Surgical History:  Procedure Laterality Date  . COLONOSCOPY N/A 11/17/2012   Procedure: COLONOSCOPY;  Surgeon: [name], MD;  [facility];  Service: Endoscopy;  Laterality: N/A;  1200-moved to1030 Ann to notify pt  . LAPAROSCOPIC SIGMOID COLECTOMY N/A 02/03/2013   Procedure: LAPAROSCOPIC SIGMOID COLECTOMY;  Surgeon: [name], MD;  Location: [facility];  Service: General;  Laterality: N/A;  . MOUTH SURGERY     wisdom teeth removed  . PROCTOSCOPY  02/03/2013   Procedure: PROCTOSCOPY;  Surgeon: [name], MD;  Location: [facility];  Service: General;;   Allergies  Allergen Reactions  . Cinnamon Anaphylaxis  . Sulfur Hives  . Ciprofloxacin     Rash  . Codeine Hives and Swelling  . Penicillins Hives  . Red Dye Hives   No current facility-administered medications on file prior to encounter.    Current Outpatient Medications on File Prior to Encounter  Medication Sig Dispense Refill  . acetaminophen (TYLENOL) 500 MG tablet Take 1,000 mg by mouth as needed for moderate pain.    . diphenhydrAMINE (BENADRYL) 25 MG tablet Take 1 tablet (25 mg total) by mouth every 6 (six) hours. 20 tablet 0  . escitalopram (LEXAPRO) 10 MG  tablet Take 10 mg by mouth daily.    . [DISCONTINUED] hyoscyamine (LEVBID) 0.375 MG 12 hr tablet Take 1 tablet (0.375 mg total) by mouth daily as needed. 30 tablet 5   Social History   Socioeconomic History  . Marital status: Single    Spouse name: Not on file  . Number of children: Not on file  . Years of education: Not on file  . Highest education level: Not on file  Occupational History  . Not on file  Social Needs  . Financial resource strain: Not on file  . Food insecurity    Worry: Not on file    Inability: Not on file  . Transportation needs    Medical: Not on file    Non-medical: Not on file  Tobacco Use  . Smoking status: Never Smoker  . Smokeless tobacco: Never Used  Substance and Sexual Activity  . Alcohol use: Yes    Comment: rarely  . Drug use: No  . Sexual activity: Never    Birth control/protection: None  Lifestyle  . Physical activity    Days per week: Not on file    Minutes per session: Not on file  . Stress: Not on file  Relationships  . Social Herbalist on phone: Not on file    Gets together: Not on file  Attends religious service: Not on file    Active member of club or organization: Not on file    Attends meetings of clubs or organizations: Not on file    Relationship status: Not on file  . Intimate partner violence    Fear of current or ex partner: Not on file    Emotionally abused: Not on file    Physically abused: Not on file    Forced sexual activity: Not on file  Other Topics Concern  . Not on file  Social History Narrative  . Not on file   Family History  Problem Relation Age of Onset  . Colon polyps Mother   . Diabetes Mother   . COPD Mother   . Colon polyps Other   . Hodgkin's lymphoma Father   . Cancer Maternal Aunt        breast  . Cancer Maternal Grandmother        breast  . Colon cancer Neg Hx     OBJECTIVE:  Vitals:   05/05/19 1710  BP: 118/80  Pulse: 77  Resp: 20  Temp: 98.5 F (36.9 C)  SpO2:  98%    General appearance: alert; no distress HENT: normocephalic; atraumatic; dentition intact; three vesicles with mild surrounding erythema localized to upper outer corner of lip on left side, no obvious drainage or bleeding Neck: supple with FROM Lungs: normal respirations Skin: warm and dry Psychological: alert and cooperative; normal mood and affect; pleasant  ASSESSMENT & PLAN:  1. Herpes labialis     Meds ordered this encounter  Medications  . valACYclovir (VALTREX) 1000 MG tablet    Sig: Take 2 tablets (2,000 mg total) by mouth 2 (two) times daily for 1 day.    Dispense:  4 tablet    Refill:  0    Order Specific Question:   Supervising Provider    Answer:   Raylene Everts [id number]   Push fluids and get rest Valtrex prescribed.  Take as directed and to completion You may use OTC products as needed Follow up with PCP as needed Return or go to the ED if you have any new or worsening symptoms such as fever, chills, difficulty swallowing, painful swallowing, oral or neck swelling, nausea, vomiting, chest pain, SOB, worsening symptoms despite treatments, etc...   Reviewed expectations re: course of current medical issues. Questions answered. Outlined signs and symptoms indicating need for more acute intervention. Patient verbalized understanding. After Visit Summary given.   Lestine Box, PA-C 05/05/19 1721

## 2019-05-05 NOTE — Discharge Instructions (Signed)
Push fluids and get rest Valtrex prescribed.  Take as directed and to completion You may use OTC products as needed Follow up with PCP as needed Return or go to the ED if you have any new or worsening symptoms such as fever, chills, difficulty swallowing, painful swallowing, oral or neck swelling, nausea, vomiting, chest pain, SOB, worsening symptoms despite treatments, etc..[name]

## 2019-05-05 NOTE — ED Triage Notes (Signed)
Pt has new cold sore on lip

## 2019-05-07 LAB — NOVEL CORONAVIRUS, NAA: SARS-CoV-2, NAA: NOT DETECTED

## 2019-05-19 ENCOUNTER — Other Ambulatory Visit (HOSPITAL_COMMUNITY): Payer: Self-pay | Admitting: Pulmonary Disease

## 2019-05-19 DIAGNOSIS — R9431 Abnormal electrocardiogram [ECG] [EKG]: Secondary | ICD-10-CM

## 2019-06-01 ENCOUNTER — Other Ambulatory Visit: Payer: Self-pay

## 2019-06-01 ENCOUNTER — Ambulatory Visit (HOSPITAL_COMMUNITY)
Admission: RE | Admit: 2019-06-01 | Discharge: 2019-06-01 | Disposition: A | Discharge status: Home / Self Care | Payer: Managed Care, Other (non HMO) | Source: Ambulatory Visit | Attending: Pulmonary Disease | Admitting: Pulmonary Disease

## 2019-06-01 DIAGNOSIS — R9431 Abnormal electrocardiogram [ECG] [EKG]: Secondary | ICD-10-CM | POA: Diagnosis not present

## 2019-06-01 NOTE — Progress Notes (Signed)
*  PRELIMINARY RESULTS* Echocardiogram 2D Echocardiogram has been performed.  Leavy Cella 06/01/2019, 2:03 PM

## 2019-06-07 DIAGNOSIS — R9431 Abnormal electrocardiogram [ECG] [EKG]: Secondary | ICD-10-CM | POA: Insufficient documentation

## 2019-06-07 DIAGNOSIS — R0683 Snoring: Secondary | ICD-10-CM | POA: Insufficient documentation

## 2019-06-30 DIAGNOSIS — R9439 Abnormal result of other cardiovascular function study: Secondary | ICD-10-CM | POA: Insufficient documentation

## 2019-06-30 DIAGNOSIS — R002 Palpitations: Secondary | ICD-10-CM | POA: Insufficient documentation

## 2019-07-05 ENCOUNTER — Ambulatory Visit (INDEPENDENT_AMBULATORY_CARE_PROVIDER_SITE_OTHER): Payer: No Typology Code available for payment source | Admitting: Internal Medicine

## 2019-07-05 ENCOUNTER — Encounter (INDEPENDENT_AMBULATORY_CARE_PROVIDER_SITE_OTHER): Payer: Self-pay | Admitting: Internal Medicine

## 2019-07-05 ENCOUNTER — Other Ambulatory Visit: Payer: Self-pay

## 2019-07-05 DIAGNOSIS — K5792 Diverticulitis of intestine, part unspecified, without perforation or abscess without bleeding: Secondary | ICD-10-CM

## 2019-07-05 MED ORDER — HYOSCYAMINE SULFATE ER 0.375 MG PO TB12: 0.3750 mg | ORAL_TABLET | Freq: Two times a day (BID) | ORAL | 0 refills | Status: DC | PRN

## 2019-07-05 NOTE — Progress Notes (Signed)
Presenting complaint;  Follow-up for sigmoid diverticulitis.  Database and subjective:  Patient is 43 year old Caucasian female who has history of recurrent sigmoid diverticulitis.  Her last colonoscopy was in April 2014.  She had laparoscopic sigmoid colon resection in July 2014 and she went 3 years without any symptoms.  She had an episode of diverticulitis in July 2017 which was documented in CT.  This year she has had an episode in April 2020 when she was seen in emergency room and confirmed to have diverticulitis. She was last seen 6 months ago and was doing well.  She was fine until 5 days ago when she noticed some discomfort in her left lower quadrant.  She thought she was constipated as she had not had a bowel movement.  However by the next morning she realized that it was more than constipation.  She started Cipro and metronidazole as before.  She did not experience fever chills nausea or vomiting.  She has noted significant pain reduction as of early this morning.  Now she just has very mild discomfort.  Her appetite is normal. She is undergoing evaluation by her cardiologist who recommended low-dose aspirin if it all right from GI standpoint.  Patient states he may not need aspirin once cardiology evaluation is completed as she may not have any underlying disease.  She states she has on a Mediterranean diet except the knots.  She is also eating much less meat.  He made this change about a month ago.  She takes Benadryl only when she takes Cipro as she gets itching.  Her itching has not gotten any worse with Cipro use. Review of her weight records reveal that she has gained 11 pounds since she was last seen 6 months ago. She does not take OTC NSAIDs.  Current Medications: Outpatient Encounter Medications as of 07/05/2019  Medication Sig  . acetaminophen (TYLENOL) 500 MG tablet Take 1,000 mg by mouth as needed for moderate pain.  [name] atenolol (TENORMIN) 25 MG tablet Take 25 mg by mouth daily.    . ciprofloxacin (CIPRO) 500 MG tablet Take by mouth 2 (two) times daily.   . cyclobenzaprine (FLEXERIL) 5 MG tablet Take 5 mg by mouth as needed for muscle spasms.   . diphenhydrAMINE (BENADRYL) 25 MG tablet Take 1 tablet (25 mg total) by mouth every 6 (six) hours.  [name] escitalopram (LEXAPRO) 10 MG tablet Take 10 mg by mouth daily.  . hyoscyamine (LEVBID) 0.375 MG 12 hr tablet Take 1 tablet (0.375 mg total) by mouth 2 (two) times daily as needed.  . metroNIDAZOLE (FLAGYL) 500 MG tablet Take 500 mg by mouth 2 (two) times daily.   . Multiple Vitamins-Minerals (AIRBORNE GUMMIES) CHEW Chew by mouth. Per patient - she takes 3 by mouth daily.  . Multiple Vitamins-Minerals (WOMENS MULTIVITAMIN PO) Take 2 tablets by mouth. Per patient this is the gummy version.  . [DISCONTINUED] hyoscyamine (LEVBID) 0.375 MG 12 hr tablet Take 1 tablet (0.375 mg total) by mouth daily as needed.  . [DISCONTINUED] hyoscyamine (LEVBID) 0.375 MG 12 hr tablet as needed  . [DISCONTINUED] hyoscyamine (LEVBID) 0.375 MG 12 hr tablet Take 0.375 mg by mouth 2 (two) times daily.   No facility-administered encounter medications on file as of 07/05/2019.      Objective: Blood pressure 129/79, pulse 79, temperature (!) 97.2 F (36.2 C), temperature source Oral, height 5\' 6"  (1.676 m), weight 282 lb 3.2 oz (128 kg), last menstrual period 06/25/2019. Patient is alert and in no acute distress.  Conjunctiva is pink. Sclera is nonicteric Oropharyngeal mucosa is normal. No neck masses or thyromegaly noted. Cardiac exam with regular rhythm normal S1 and S2. No murmur or gallop noted. Lungs are clear to auscultation. Abdomen is full.  Bowel sounds are normal.  She has laparoscopy scars.  On palpation abdomen is soft.  She has mild tenderness in left lower quadrant on deep palpation without guarding.  No organomegaly or masses. No LE edema or clubbing noted.   Assessment:  #1.  Recurrent sigmoid or left sided diverticulitis.  She is  status post sigmoid colon resection in July 2014 and she continues to experience recurrent bouts.  She had one in July 2017 and this is her second episode this year.  She is aware not to take OTC NSAIDs but I do not see contraindication to use of low-dose aspirin. Patient has changed her diet completely.  Hopefully colonic flora will change and perhaps reduce the risk of diverticulitis. If she has another episode I may consider empiric therapy with oral mesalamine.   Plan:  Patient can take aspirin 81 mg by mouth daily as recommended by her cardiologist. High-fiber diet. Consider eating apple with skin every day or Metamucil or Citrucel 3 to 4 g/day. She can stop antibiotic on day 7 as long as she does not have any pain or tenderness otherwise continue therapy for 10 days. Patient advised to call office if she has another episode. Office visit in 1 year.

## 2019-07-05 NOTE — Patient Instructions (Addendum)
Can take baby aspirin as recommended by your cardiologist. High-fiber diet. Unless you eat 1 apple daily consider fiber supplement such as Citrucel or Metamucil or Benefiber 3 to 4 g daily. Can stop antibiotic after 7 or 10 days as discussed. Please call office if you feel you are having another episode of diverticulitis.

## 2019-07-06 ENCOUNTER — Other Ambulatory Visit: Payer: Self-pay

## 2019-07-06 DIAGNOSIS — Z20822 Contact with and (suspected) exposure to covid-19: Secondary | ICD-10-CM

## 2019-07-07 LAB — NOVEL CORONAVIRUS, NAA: SARS-CoV-2, NAA: NOT DETECTED

## 2019-07-24 DIAGNOSIS — E669 Obesity, unspecified: Secondary | ICD-10-CM | POA: Insufficient documentation

## 2019-07-24 DIAGNOSIS — M25561 Pain in right knee: Secondary | ICD-10-CM | POA: Insufficient documentation

## 2019-07-24 DIAGNOSIS — G4700 Insomnia, unspecified: Secondary | ICD-10-CM | POA: Insufficient documentation

## 2019-07-24 DIAGNOSIS — K5712 Diverticulitis of small intestine without perforation or abscess without bleeding: Secondary | ICD-10-CM | POA: Insufficient documentation

## 2019-07-24 DIAGNOSIS — J0110 Acute frontal sinusitis, unspecified: Secondary | ICD-10-CM | POA: Insufficient documentation

## 2019-08-17 ENCOUNTER — Other Ambulatory Visit: Payer: Self-pay

## 2019-08-17 ENCOUNTER — Ambulatory Visit: Payer: No Typology Code available for payment source | Attending: Internal Medicine

## 2019-08-17 DIAGNOSIS — Z20822 Contact with and (suspected) exposure to covid-19: Secondary | ICD-10-CM

## 2019-08-18 ENCOUNTER — Other Ambulatory Visit: Payer: No Typology Code available for payment source

## 2019-08-18 LAB — NOVEL CORONAVIRUS, NAA: SARS-CoV-2, NAA: NOT DETECTED

## 2019-09-04 DIAGNOSIS — G4761 Periodic limb movement disorder: Secondary | ICD-10-CM | POA: Insufficient documentation

## 2019-09-27 ENCOUNTER — Other Ambulatory Visit: Payer: Self-pay

## 2019-09-27 ENCOUNTER — Ambulatory Visit
Admission: EM | Admit: 2019-09-27 | Discharge: 2019-09-27 | Disposition: A | Discharge status: Home / Self Care | Payer: No Typology Code available for payment source | Attending: Emergency Medicine | Admitting: Emergency Medicine

## 2019-09-27 DIAGNOSIS — B001 Herpesviral vesicular dermatitis: Secondary | ICD-10-CM

## 2019-09-27 MED ORDER — VALACYCLOVIR HCL 1 G PO TABS: 2000.0000 mg | ORAL_TABLET | Freq: Two times a day (BID) | ORAL | 0 refills | Status: AC

## 2019-09-27 NOTE — ED Triage Notes (Signed)
Pt presents with cold sore on lips, abreva not helping

## 2019-09-27 NOTE — Discharge Instructions (Addendum)
Valtrex prescribed.   Follow up with PCP as needed Return or go to the ER if you have any new or worsening symptoms such as fever, chills, painful swallowing, difficulty swallowing, oral/ neck swelling, nausea, vomiting, redness, swelling, discharge, if symptoms do not improve with medication, etc..[name]

## 2019-09-27 NOTE — ED Provider Notes (Signed)
[place]   [id number] 09/27/19 Arrival Time: [id number]  CC: Fever blister  SUBJECTIVE:  Virginia Barrett is a 44 y.o. female who presents with a fever blister x 1 day.  Denies trauma, but does admit to stressors related to school  Localizes the fever blister to lower lip.  Describes it as painful, and burning in character.  Has tried OTC abreva without relief.  Symptoms are made worse to the touch.  Reports similar symptoms in the past that improved with valtrex.   Denies fever, chills, nausea, vomiting, erythema, swelling, discharge, SOB, chest pain, abdominal pain, changes in bowel or bladder function.    ROS: As per HPI.  All other pertinent ROS negative.     Past Medical History:  Diagnosis Date  . Acute frontal sinusitis, unspecified   . Bronchitis, not specified as acute or chronic   . Constipation   . Diarrhea   . Disappearance and death of family member   . Diverticulitis   . Diverticulitis of small intestine without perforation or abscess without bleeding   . Dizziness and giddiness   . Headache(784.0)    occasional   . Insomnia, unspecified   . Lumbago   . Obesity, unspecified   . Pain in right knee   . Pneumonia    hx of walking pneumonia   . Precancerous skin lesion   . Sinus infection    diagnosed on 01/28/13    Past Surgical History:  Procedure Laterality Date  . COLONOSCOPY N/A 11/17/2012   Procedure: COLONOSCOPY;  Surgeon: [name], MD;  Location: [facility];  Service: Endoscopy;  Laterality: N/A;  1200-moved to1030 Ann to notify pt  . LAPAROSCOPIC SIGMOID COLECTOMY N/A 02/03/2013   Procedure: LAPAROSCOPIC SIGMOID COLECTOMY;  Surgeon: [name], MD;  Location: [facility];  Service: General;  Laterality: N/A;  . MOUTH SURGERY     wisdom teeth removed  . PROCTOSCOPY  02/03/2013   Procedure: PROCTOSCOPY;  Surgeon: [name], MD;  Location: [facility];  Service: General;;   Allergies  Allergen Reactions  . Cinnamon Anaphylaxis  . Sulfur Hives    . Ciprofloxacin     Rash  . Codeine Hives and Swelling  . Penicillins Hives  . Red Dye Hives   No current facility-administered medications on file prior to encounter.   Current Outpatient Medications on File Prior to Encounter  Medication Sig Dispense Refill  . acetaminophen (TYLENOL) 500 MG tablet Take 1,000 mg by mouth as needed for moderate pain.    [name] atenolol (TENORMIN) 25 MG tablet Take 25 mg by mouth daily.     . cyclobenzaprine (FLEXERIL) 5 MG tablet Take 5 mg by mouth as needed for muscle spasms.     [name] escitalopram (LEXAPRO) 10 MG tablet Take 10 mg by mouth daily.    . hyoscyamine (LEVBID) 0.375 MG 12 hr tablet Take 1 tablet (0.375 mg total) by mouth 2 (two) times daily as needed. 60 tablet 0  . Multiple Vitamins-Minerals (AIRBORNE GUMMIES) CHEW Chew by mouth. Per patient - she takes 3 by mouth daily.    . Multiple Vitamins-Minerals (WOMENS MULTIVITAMIN PO) Take 2 tablets by mouth. Per patient this is the gummy version.    . polyethylene glycol (MIRALAX / GLYCOLAX) 17 g packet Take 8.5 g by mouth daily.    . [DISCONTINUED] diphenhydrAMINE (BENADRYL) 25 MG tablet Take 1 tablet (25 mg total) by mouth every 6 (six) hours. 20 tablet 0   Social History  Socioeconomic History  . Marital status: Single    Spouse name: Not on file  . Number of children: Not on file  . Years of education: Not on file  . Highest education level: Not on file  Occupational History  . Not on file  Tobacco Use  . Smoking status: Never Smoker  . Smokeless tobacco: Never Used  Substance and Sexual Activity  . Alcohol use: Yes    Comment: rarely  . Drug use: No  . Sexual activity: Never    Birth control/protection: None  Other Topics Concern  . Not on file  Social History Narrative  . Not on file   Social Determinants of Health   Financial Resource Strain:   . Difficulty of Paying Living Expenses: Not on file  Food Insecurity:   . Worried About Charity fundraiser in the Last Year: Not on  file  . Ran Out of Food in the Last Year: Not on file  Transportation Needs:   . Lack of Transportation (Medical): Not on file  . Lack of Transportation (Non-Medical): Not on file  Physical Activity:   . Days of Exercise per Week: Not on file  . Minutes of Exercise per Session: Not on file  Stress:   . Feeling of Stress : Not on file  Social Connections:   . Frequency of Communication with Friends and Family: Not on file  . Frequency of Social Gatherings with Friends and Family: Not on file  . Attends Religious Services: Not on file  . Active Member of Clubs or Organizations: Not on file  . Attends Archivist Meetings: Not on file  . Marital Status: Not on file  Intimate Partner Violence:   . Fear of Current or Ex-Partner: Not on file  . Emotionally Abused: Not on file  . Physically Abused: Not on file  . Sexually Abused: Not on file   Family History  Problem Relation Age of Onset  . Colon polyps Mother   . Diabetes Mother   . COPD Mother   . Colon polyps Other   . Hodgkin's lymphoma Father   . Cancer Maternal Aunt        breast  . Cancer Maternal Grandmother        breast  . Colon cancer Neg Hx     OBJECTIVE: Vitals:   09/27/19 1211  BP: 122/87  Pulse: 96  Resp: 18  Temp: 98.4 F (36.9 C)  SpO2: 95%    General appearance: alert; no distress Head: NCAT ENT: EOMI grossly; nares patent; vesicle with white base and surrounding erythema to lower RT side of lip, no obvious drainage or bleeding Lungs: Normal respiratory effort Extremities: no edema Skin: warm and dry Psychological: alert and cooperative; normal mood and affect  ASSESSMENT & PLAN:  1. Herpes labialis     Meds ordered this encounter  Medications  . valACYclovir (VALTREX) 1000 MG tablet    Sig: Take 2 tablets (2,000 mg total) by mouth 2 (two) times daily for 1 day. Separate doses by 12 hours    Dispense:  4 tablet    Refill:  0    Order Specific Question:   Supervising Provider     Answer:   Raylene Everts [id number]    Valtrex prescribed.   Follow up with PCP as needed Return or go to the ER if you have any new or worsening symptoms such as fever, chills, painful swallowing, difficulty swallowing, oral/ neck swelling, nausea, vomiting, redness, swelling,  discharge, if symptoms do not improve with medication, etc...  Reviewed expectations re: course of current medical issues. Questions answered. Outlined signs and symptoms indicating need for more acute intervention. Patient verbalized understanding. After Visit Summary given.   Lestine Box, PA-C 09/27/19 1227

## 2019-12-31 ENCOUNTER — Ambulatory Visit (INDEPENDENT_AMBULATORY_CARE_PROVIDER_SITE_OTHER): Payer: No Typology Code available for payment source

## 2019-12-31 ENCOUNTER — Other Ambulatory Visit: Payer: Self-pay

## 2019-12-31 ENCOUNTER — Ambulatory Visit
Admission: EM | Admit: 2019-12-31 | Discharge: 2019-12-31 | Disposition: A | Discharge status: Home / Self Care | Payer: No Typology Code available for payment source | Attending: Emergency Medicine | Admitting: Emergency Medicine

## 2019-12-31 DIAGNOSIS — S6992XA Unspecified injury of left wrist, hand and finger(s), initial encounter: Secondary | ICD-10-CM | POA: Diagnosis not present

## 2019-12-31 DIAGNOSIS — M25562 Pain in left knee: Secondary | ICD-10-CM | POA: Diagnosis not present

## 2019-12-31 DIAGNOSIS — M79602 Pain in left arm: Secondary | ICD-10-CM | POA: Diagnosis not present

## 2019-12-31 MED ORDER — PREDNISONE 10 MG PO TABS: 20.0000 mg | ORAL_TABLET | Freq: Every day | ORAL | 0 refills | Status: DC

## 2019-12-31 MED ORDER — ACETAMINOPHEN 500 MG PO TABS: 500.0000 mg | ORAL_TABLET | Freq: Four times a day (QID) | ORAL | 0 refills | Status: DC | PRN

## 2019-12-31 MED ORDER — CYCLOBENZAPRINE HCL 5 MG PO TABS: 5.0000 mg | ORAL_TABLET | Freq: Three times a day (TID) | ORAL | 0 refills | Status: DC | PRN
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
## 2019-12-31 NOTE — ED Triage Notes (Signed)
Also pain to left hand
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
## 2019-12-31 NOTE — Discharge Instructions (Addendum)
Rest, ice and heat as needed Ensure adequate range of motion as tolerated. Injuries all appear to be muscular in nature at this time Prescribed Tylenol as needed for inflammation and pain relief.  DO NOT TAKE WITH OTHER antiinflammatories, as this may cause GI upset and/or bleed Prescribed prednisone Prescribed flexeril as needed at bedtime for muscle spasm.  Do not drive or operate heavy machinery while taking this medication Expect some increased pain in the next 1-3 days.  It may take 3-4 weeks for complete resolution of symptoms Will f/u with her doctor or here if not seeing significant improvement within one week. Return here or go to ER if you have any new or worsening symptoms such as numbness/tingling of the inner thighs, loss of bladder or bowel control, headache/blurry vision, nausea/vomiting, confusion/altered mental status, dizziness, weakness, passing out, imbalance, etc..

## 2020-01-23 ENCOUNTER — Ambulatory Visit (INDEPENDENT_AMBULATORY_CARE_PROVIDER_SITE_OTHER): Payer: No Typology Code available for payment source

## 2020-01-23 ENCOUNTER — Ambulatory Visit
Admission: EM | Admit: 2020-01-23 | Discharge: 2020-01-23 | Disposition: A | Discharge status: Home / Self Care | Payer: No Typology Code available for payment source | Attending: Emergency Medicine | Admitting: Emergency Medicine

## 2020-01-23 DIAGNOSIS — M25562 Pain in left knee: Secondary | ICD-10-CM

## 2020-01-23 MED ORDER — PREDNISONE 10 MG PO TABS: 20.0000 mg | ORAL_TABLET | Freq: Every day | ORAL | 0 refills | Status: DC

## 2020-01-23 MED ORDER — CYCLOBENZAPRINE HCL 5 MG PO TABS: 5.0000 mg | ORAL_TABLET | Freq: Three times a day (TID) | ORAL | 0 refills | Status: DC | PRN

## 2020-01-23 NOTE — Discharge Instructions (Addendum)
Rest, ice and heat as needed Follow RICE instructions as attached Ensure adequate range of motion as tolerated. Prednisone was prescribed Prescribed flexeril as needed at bedtime for muscle spasm.  Do not drive or operate heavy machinery while taking this medication  Return here or go to ER if you have any new or worsening symptoms such as numbness/tingling of the inner thighs, loss of bladder or bowel control, headache/blurry vision, nausea/vomiting, confusion/altered mental status, dizziness, weakness, passing out, imbalance, etc..[name]
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
## 2020-02-08 ENCOUNTER — Other Ambulatory Visit: Payer: Self-pay

## 2020-02-08 ENCOUNTER — Encounter: Payer: Self-pay | Admitting: Orthopedic Surgery

## 2020-02-08 ENCOUNTER — Ambulatory Visit (INDEPENDENT_AMBULATORY_CARE_PROVIDER_SITE_OTHER): Payer: No Typology Code available for payment source | Admitting: Orthopedic Surgery

## 2020-02-08 VITALS — BP 154/105 | HR 94 | Ht 67.0 in | Wt 295.0 lb

## 2020-02-08 DIAGNOSIS — Z6841 Body Mass Index (BMI) 40.0 and over, adult: Secondary | ICD-10-CM | POA: Diagnosis not present

## 2020-02-08 DIAGNOSIS — S8002XA Contusion of left knee, initial encounter: Secondary | ICD-10-CM | POA: Diagnosis not present

## 2020-02-08 MED ORDER — CYCLOBENZAPRINE HCL 5 MG PO TABS: 5.0000 mg | ORAL_TABLET | Freq: Three times a day (TID) | ORAL | 0 refills | Status: DC | PRN
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
## 2020-02-08 NOTE — Patient Instructions (Signed)
Start therapy left knee  Contusion left knee

## 2020-02-17 ENCOUNTER — Encounter: Payer: Self-pay | Admitting: Emergency Medicine

## 2020-02-17 ENCOUNTER — Ambulatory Visit
Admission: EM | Admit: 2020-02-17 | Discharge: 2020-02-17 | Disposition: A | Discharge status: Home / Self Care | Payer: Managed Care, Other (non HMO) | Attending: Emergency Medicine | Admitting: Emergency Medicine

## 2020-02-17 DIAGNOSIS — Z760 Encounter for issue of repeat prescription: Secondary | ICD-10-CM

## 2020-02-17 DIAGNOSIS — B001 Herpesviral vesicular dermatitis: Secondary | ICD-10-CM

## 2020-02-17 MED ORDER — VALACYCLOVIR HCL 1 G PO TABS: 2000.0000 mg | ORAL_TABLET | Freq: Two times a day (BID) | ORAL | 2 refills | Status: AC

## 2020-02-17 NOTE — ED Triage Notes (Signed)
Needs refill on valtrex

## 2020-02-17 NOTE — Discharge Instructions (Signed)
Medication refill  Follow up with PCP as needed

## 2020-02-17 NOTE — ED Provider Notes (Signed)
[place]   [id number] 02/17/20 Arrival Time: 1903  CC: Medication refill  SUBJECTIVE:  Virginia Barrett is a 44 y.o. female  who presents for valtrex medication refill.  Takes for cold sores on lip.  Cold sore to top lip x 1 day.  Had court yesterday.  Denies alleviating or aggravating factors.  Denies fever, chills, nausea, vomiting, CP, SOB, abdominal pain, changes in bowel or bladder habits.      ROS: As per HPI.  All other pertinent ROS negative.     Past Medical History:  Diagnosis Date   Acute frontal sinusitis, unspecified    Bronchitis, not specified as acute or chronic    Constipation    Diarrhea    Disappearance and death of family member    Diverticulitis    Diverticulitis of small intestine without perforation or abscess without bleeding    Dizziness and giddiness    Headache(784.0)    occasional    Insomnia, unspecified    Lumbago    Obesity, unspecified    Pain in right knee    Pneumonia    hx of walking pneumonia    Precancerous skin lesion    Sinus infection    diagnosed on 01/28/13    Past Surgical History:  Procedure Laterality Date   COLONOSCOPY N/A 11/17/2012   Procedure: COLONOSCOPY;  Surgeon: [name], MD;  [facility];  Service: Endoscopy;  Laterality: N/A;  1200-moved to1030 Ann to notify pt   LAPAROSCOPIC SIGMOID COLECTOMY N/A 02/03/2013   Procedure: LAPAROSCOPIC SIGMOID COLECTOMY;  Surgeon: [name], MD;  Location: [facility];  Service: General;  Laterality: N/A;   MOUTH SURGERY     wisdom teeth removed   PROCTOSCOPY  02/03/2013   Procedure: PROCTOSCOPY;  Surgeon: [name], MD;  Location: [facility];  Service: General;;   Allergies  Allergen Reactions   Cinnamon Anaphylaxis   Sulfur Hives   Ciprofloxacin     Rash   Codeine Hives and Swelling   Penicillins Hives   Red Dye Hives   No current facility-administered medications on file prior to encounter.   Current Outpatient  Medications on File Prior to Encounter  Medication Sig Dispense Refill   acetaminophen (TYLENOL) 500 MG tablet Take 1 tablet (500 mg total) by mouth every 6 (six) hours as needed. 30 tablet 0   atenolol (TENORMIN) 25 MG tablet Take 25 mg by mouth daily.      escitalopram (LEXAPRO) 10 MG tablet Take 10 mg by mouth daily.     hyoscyamine (LEVBID) 0.375 MG 12 hr tablet Take 1 tablet (0.375 mg total) by mouth 2 (two) times daily as needed. 60 tablet 0   Multiple Vitamins-Minerals (AIRBORNE GUMMIES) CHEW Chew by mouth. Per patient - she takes 3 by mouth daily.     Multiple Vitamins-Minerals (WOMENS MULTIVITAMIN PO) Take 2 tablets by mouth. Per patient this is the gummy version.     polyethylene glycol (MIRALAX / GLYCOLAX) 17 g packet Take 8.5 g by mouth daily.     [DISCONTINUED] diphenhydrAMINE (BENADRYL) 25 MG tablet Take 1 tablet (25 mg total) by mouth every 6 (six) hours. 20 tablet 0   Social History   Socioeconomic History   Marital status: Single    Spouse name: Not on file   Number of children: Not on file   Years of education: Not on file   Highest education level: Not on file  Occupational History   Not on file  Tobacco  Use   Smoking status: Never Smoker   Smokeless tobacco: Never Used  Vaping Use   Vaping Use: Never used  Substance and Sexual Activity   Alcohol use: Yes    Comment: rarely   Drug use: No   Sexual activity: Never    Birth control/protection: None  Other Topics Concern   Not on file  Social History Narrative   Not on file   Social Determinants of Health   Financial Resource Strain:    Difficulty of Paying Living Expenses:   Food Insecurity:    Worried About Charity fundraiser in the Last Year:    Arboriculturist in the Last Year:   Transportation Needs:    Film/video editor (Medical):    Lack of Transportation (Non-Medical):   Physical Activity:    Days of Exercise per Week:    Minutes of Exercise per Session:     Stress:    Feeling of Stress :   Social Connections:    Frequency of Communication with Friends and Family:    Frequency of Social Gatherings with Friends and Family:    Attends Religious Services:    Active Member of Clubs or Organizations:    Attends Music therapist:    Marital Status:   Intimate Partner Violence:    Fear of Current or Ex-Partner:    Emotionally Abused:    Physically Abused:    Sexually Abused:    Family History  Problem Relation Age of Onset   Colon polyps Mother    Diabetes Mother    COPD Mother    Colon polyps Other    Hodgkin's lymphoma Father    Cancer Maternal Aunt        breast   Cancer Maternal Grandmother        breast   Colon cancer Neg Hx     OBJECTIVE:  Vitals:   02/17/20 1908 02/17/20 1914 02/17/20 1915  BP: (!) 141/86    Pulse: 75    Resp: 16    Temp: 98.5 F (36.9 C)    TempSrc: Oral    SpO2: 98%    Weight:  (!) 293 lb 3.4 oz (133 kg) (!) 293 lb 3.4 oz (133 kg)  Height:  5\' 7"  (1.702 m)     General appearance: alert; no distress Eyes: PERRLA; EOMI HENT: normocephalic; atraumatic; small erythematous sore to LT upper lip Neck: supple with FROM Lungs: clear to auscultation bilaterally Heart: regular rate and rhythm.   Extremities: no edema; symmetrical with no gross deformities Skin: warm and dry Psychological: alert and cooperative; normal mood and affect   ASSESSMENT & PLAN:  1. Medication refill   2. Herpes labialis     Meds ordered this encounter  Medications   valACYclovir (VALTREX) 1000 MG tablet    Sig: Take 2 tablets (2,000 mg total) by mouth 2 (two) times daily for 1 day. 2 g ORALLY twice daily for 1 day; separate doses by 12 hours    Dispense:  4 tablet    Refill:  2    Order Specific Question:   Supervising Provider    Answer:   Raylene Everts [[id number]]    Medication refill  Follow up with PCP as needed  Reviewed expectations re: course of current medical issues.  Questions answered. Outlined signs and symptoms indicating need for more acute intervention. Patient verbalized understanding. After Visit Summary given.   Lestine Box, PA-C 02/17/20 1922

## 2020-03-26 ENCOUNTER — Ambulatory Visit: Payer: No Typology Code available for payment source | Admitting: Orthopedic Surgery

## 2020-05-03 ENCOUNTER — Encounter (HOSPITAL_COMMUNITY): Payer: Self-pay | Admitting: Emergency Medicine

## 2020-05-03 ENCOUNTER — Emergency Department (HOSPITAL_COMMUNITY)
Admission: EM | Admit: 2020-05-03 | Discharge: 2020-05-03 | Disposition: A | Discharge status: Home / Self Care | Payer: No Typology Code available for payment source | Attending: Emergency Medicine | Admitting: Emergency Medicine

## 2020-05-03 ENCOUNTER — Other Ambulatory Visit: Payer: Self-pay

## 2020-05-03 DIAGNOSIS — Z79899 Other long term (current) drug therapy: Secondary | ICD-10-CM | POA: Diagnosis not present

## 2020-05-03 DIAGNOSIS — U071 COVID-19: Secondary | ICD-10-CM | POA: Insufficient documentation

## 2020-05-03 DIAGNOSIS — R112 Nausea with vomiting, unspecified: Secondary | ICD-10-CM | POA: Diagnosis present

## 2020-05-03 LAB — URINALYSIS, ROUTINE W REFLEX MICROSCOPIC
Bilirubin Urine: NEGATIVE
Glucose, UA: NEGATIVE mg/dL
Ketones, ur: NEGATIVE mg/dL
Leukocytes,Ua: NEGATIVE
Nitrite: NEGATIVE
Protein, ur: 30 mg/dL — AB
Specific Gravity, Urine: 1.006 (ref 1.005–1.030)
pH: 7 (ref 5.0–8.0)

## 2020-05-03 LAB — LIPASE, BLOOD: Lipase: 52 U/L — ABNORMAL HIGH (ref 11–51)

## 2020-05-03 LAB — CBC
HCT: 41 % (ref 36.0–46.0)
Hemoglobin: 13.5 g/dL (ref 12.0–15.0)
MCH: 29 pg (ref 26.0–34.0)
MCHC: 32.9 g/dL (ref 30.0–36.0)
MCV: 88.2 fL (ref 80.0–100.0)
Platelets: 189 10*3/uL (ref 150–400)
RBC: 4.65 MIL/uL (ref 3.87–5.11)
RDW: 13.7 % (ref 11.5–15.5)
WBC: 4.3 10*3/uL (ref 4.0–10.5)
nRBC: 0 % (ref 0.0–0.2)

## 2020-05-03 LAB — COMPREHENSIVE METABOLIC PANEL
ALT: 53 U/L — ABNORMAL HIGH (ref 0–44)
AST: 82 U/L — ABNORMAL HIGH (ref 15–41)
Albumin: 3.4 g/dL — ABNORMAL LOW (ref 3.5–5.0)
Alkaline Phosphatase: 32 U/L — ABNORMAL LOW (ref 38–126)
Anion gap: 9 (ref 5–15)
BUN: 6 mg/dL (ref 6–20)
CO2: 31 mmol/L (ref 22–32)
Calcium: 8.3 mg/dL — ABNORMAL LOW (ref 8.9–10.3)
Chloride: 97 mmol/L — ABNORMAL LOW (ref 98–111)
Creatinine, Ser: 0.68 mg/dL (ref 0.44–1.00)
GFR calc non Af Amer: >60 mL/min (ref >60)
Glucose, Bld: 113 mg/dL — ABNORMAL HIGH (ref 70–99)
Potassium: 3.7 mmol/L (ref 3.5–5.1)
Sodium: 137 mmol/L (ref 135–145)
Total Bilirubin: 0.6 mg/dL (ref 0.3–1.2)
Total Protein: 6.7 g/dL (ref 6.5–8.1)

## 2020-05-03 MED ORDER — ACETAMINOPHEN 325 MG PO TABS
650.0000 mg | ORAL_TABLET | Freq: Once | ORAL | Status: AC
  Administered 2020-05-03: 650 mg via ORAL
  Filled 2020-05-03: qty 2

## 2020-05-03 MED ORDER — ONDANSETRON HCL 4 MG PO TABS: 4.0000 mg | ORAL_TABLET | Freq: Three times a day (TID) | ORAL | 0 refills | Status: DC | PRN

## 2020-05-03 MED ORDER — ONDANSETRON 4 MG PO TBDP
4.0000 mg | ORAL_TABLET | Freq: Once | ORAL | Status: AC
  Administered 2020-05-03: 4 mg via ORAL
  Filled 2020-05-03: qty 1

## 2020-05-03 NOTE — ED Notes (Signed)
Pt given po challenge.

## 2020-05-03 NOTE — Discharge Instructions (Signed)
You are Covid positive.  You must self quarantine for 10 days starting on symptom onset.  I recommend Claritin as well as Flonase for decongestion, Tylenol for fever control and ibuprofen for pain control.  Also prescribed you Zofran for nausea.  Please stay hydrated and if you do not have an appetite I recommend soups as this will provide you with fluids as well as calories.  I have contacted the infusion clinic and if you qualify for Covid treatment they will contact you to schedule an appointment.  I would like to contact post Covid care they can provide more information on how to manage your Covid symptoms.   Come back to emergency department if you develop severe chest pain, shortness of breath, severe abdominal pain, nausea, vomiting, diarrhea.

## 2020-05-03 NOTE — ED Provider Notes (Signed)
[facility] EMERGENCY DEPARTMENT Provider Note   CSN: [id number] Arrival date & time: 05/03/20  1043     History Chief Complaint  Patient presents with  . Nausea    Virginia Barrett is a 44 y.o. female.  HPI   Patient with significant medical history of bronchitis, diverticulitis, obesity presents to the emergency department with chief complaint of nausea, vomiting, and diarrhea since Friday of last week.  Patient states she tested positive for Covid yesterday at [facility].  She states she has had difficulty holding food down as she becomes very nauseous. she states she has been taking some Zofran which helps her keep down liquids.  Patient denies seeing blood in her emesis, hematuria, blood in her stools. She does admits to subjective,  fevers and chills, nasal congestion, sore throat, productive cough, general body aches, denies chest pain, or shortness of breath.  She has been taking Benadryl to help with her nasal decongestion as well as ibuprofen for pain.  She states this helps out a little bit.  She has not been Covid vaccinated and does not remember any incidents where she came to contact with someone with Covid.  Patient denies ear pain, chest pain, shortness of breath, abdominal pain, dysuria, pedal edema.  Past Medical History:  Diagnosis Date  . Acute frontal sinusitis, unspecified   . Bronchitis, not specified as acute or chronic   . Constipation   . Diarrhea   . Disappearance and death of family member   . Diverticulitis   . Diverticulitis of small intestine without perforation or abscess without bleeding   . Dizziness and giddiness   . Headache(784.0)    occasional   . Insomnia, unspecified   . Lumbago   . Obesity, unspecified   . Pain in right knee   . Pneumonia    hx of walking pneumonia   . Precancerous skin lesion   . Sinus infection    diagnosed on 01/28/13     Patient Active Problem List   Diagnosis Date Noted  . Diverticulitis of small intestine without  perforation or abscess without bleeding   . Insomnia, unspecified   . Acute frontal sinusitis, unspecified   . Obesity, unspecified   . Pain in right knee   . Diverticulitis     Past Surgical History:  Procedure Laterality Date  . COLONOSCOPY N/A 11/17/2012   Procedure: COLONOSCOPY;  Surgeon: [name], MD;  Location: [facility] ENDO SUITE;  Service: Endoscopy;  Laterality: N/A;  1200-moved to1030 Ann to notify pt  . LAPAROSCOPIC SIGMOID COLECTOMY N/A 02/03/2013   Procedure: LAPAROSCOPIC SIGMOID COLECTOMY;  Surgeon: [name], MD;  Location: [facility] ORS;  Service: General;  Laterality: N/A;  . MOUTH SURGERY     wisdom teeth removed  . PROCTOSCOPY  02/03/2013   Procedure: PROCTOSCOPY;  Surgeon: [name], MD;  Location: [facility] ORS;  Service: General;;     OB History   No obstetric history on file.     Family History  Problem Relation Age of Onset  . Colon polyps Mother   . Diabetes Mother   . COPD Mother   . Colon polyps Other   . Hodgkin's lymphoma Father   . Cancer Maternal Aunt        breast  . Cancer Maternal Grandmother        breast  . Colon cancer Neg Hx     Social History   Tobacco Use  . Smoking status: Never Smoker  . Smokeless tobacco:  Never Used  Vaping Use  . Vaping Use: Never used  Substance Use Topics  . Alcohol use: Yes    Comment: rarely  . Drug use: No    Home Medications Prior to Admission medications   Medication Sig Start Date End Date Taking? Authorizing Provider  acetaminophen (TYLENOL) 500 MG tablet Take 1 tablet (500 mg total) by mouth every 6 (six) hours as needed. 12/31/19   Avegno, Darrelyn Hillock, FNP  atenolol (TENORMIN) 25 MG tablet Take 25 mg by mouth daily.  06/30/19 06/29/20  [provider]  escitalopram (LEXAPRO) 10 MG tablet Take 10 mg by mouth daily.    [provider]  hyoscyamine (LEVBID) 0.375 MG 12 hr tablet Take 1 tablet (0.375 mg total) by mouth 2 (two) times daily as needed. 07/05/19   Rehman, Mechele Dawley, MD    Multiple Vitamins-Minerals (AIRBORNE GUMMIES) CHEW Chew by mouth. Per patient - she takes 3 by mouth daily.    [provider]  Multiple Vitamins-Minerals (WOMENS MULTIVITAMIN PO) Take 2 tablets by mouth. Per patient this is the gummy version.    [provider]  ondansetron (ZOFRAN) 4 MG tablet Take 1 tablet (4 mg total) by mouth every 8 (eight) hours as needed for nausea or vomiting. 05/03/20   Marcello Fennel, PA-C  polyethylene glycol (MIRALAX / GLYCOLAX) 17 g packet Take 8.5 g by mouth daily.    [provider]  diphenhydrAMINE (BENADRYL) 25 MG tablet Take 1 tablet (25 mg total) by mouth every 6 (six) hours. 11/14/18 09/27/19  Fredia Sorrow, MD    Allergies    Cinnamon, Sulfur, Ciprofloxacin, Codeine, Penicillins, and Red dye  Review of Systems   Review of Systems  Constitutional: Positive for chills and fever.  HENT: Positive for congestion and sore throat. Negative for tinnitus, trouble swallowing and voice change.   Eyes: Negative for visual disturbance.  Respiratory: Positive for cough. Negative for shortness of breath.   Cardiovascular: Negative for chest pain and palpitations.  Gastrointestinal: Positive for diarrhea, nausea and vomiting. Negative for abdominal pain.  Genitourinary: Negative for dysuria, enuresis, flank pain, vaginal bleeding and vaginal discharge.  Musculoskeletal: Negative for back pain.  Skin: Negative for rash.  Neurological: Negative for dizziness and headaches.  Hematological: Does not bruise/bleed easily.    Physical Exam Updated Vital Signs BP 125/83 (BP Location: Right Arm)   Pulse (!) 104   Temp (!) 100.4 F (38 C) (Oral)   Resp 19   Ht 5\' 6"  (1.676 m)   Wt 127 kg   LMP 04/24/2020   SpO2 95%   BMI 45.19 kg/m   Physical Exam Vitals and nursing note reviewed.  Constitutional:      General: She is not in acute distress.    Appearance: She is not ill-appearing.  HENT:     Head: Normocephalic and atraumatic.      Right Ear: Tympanic membrane, ear canal and external ear normal.     Left Ear: Tympanic membrane, ear canal and external ear normal.     Ears:     Comments: Patient's right TM had a fluid level, no signs of infection noted.    Nose: Congestion present. No rhinorrhea.     Mouth/Throat:     Mouth: Mucous membranes are moist.     Pharynx: Oropharynx is clear. No oropharyngeal exudate or posterior oropharyngeal erythema.  Eyes:     General: No scleral icterus. Cardiovascular:     Rate and Rhythm: Normal rate and regular rhythm.  Pulses: Normal pulses.     Heart sounds: No murmur heard.  No friction rub. No gallop.   Pulmonary:     Effort: No respiratory distress.     Breath sounds: No wheezing, rhonchi or rales.  Abdominal:     General: There is no distension.     Palpations: Abdomen is soft.     Tenderness: There is no abdominal tenderness. There is no right CVA tenderness, left CVA tenderness or guarding.  Musculoskeletal:        General: No swelling or tenderness.     Right lower leg: No edema.     Left lower leg: No edema.  Skin:    General: Skin is warm and dry.     Capillary Refill: Capillary refill takes less than 2 seconds.     Findings: No rash.  Neurological:     Mental Status: She is alert.  Psychiatric:        Mood and Affect: Mood normal.     ED Results / Procedures / Treatments   Labs (all labs ordered are listed, but only abnormal results are displayed) Labs Reviewed  LIPASE, BLOOD - Abnormal; Notable for the following components:      Result Value   Lipase 52 (*)    All other components within normal limits  COMPREHENSIVE METABOLIC PANEL - Abnormal; Notable for the following components:   Chloride 97 (*)    Glucose, Bld 113 (*)    Calcium 8.3 (*)    Albumin 3.4 (*)    AST 82 (*)    ALT 53 (*)    Alkaline Phosphatase 32 (*)    All other components within normal limits  URINALYSIS, ROUTINE W REFLEX MICROSCOPIC - Abnormal; Notable for the  following components:   APPearance HAZY (*)    Hgb urine dipstick MODERATE (*)    Protein, ur 30 (*)    Bacteria, UA RARE (*)    All other components within normal limits  CBC    EKG None  Radiology No results found.  Procedures Procedures (including critical care time)  Medications Ordered in ED Medications  ondansetron (ZOFRAN-ODT) disintegrating tablet 4 mg (4 mg Oral Given 05/03/20 1323)  acetaminophen (TYLENOL) tablet 650 mg (650 mg Oral Given 05/03/20 1322)    ED Course  I have reviewed the triage vital signs and the nursing notes.  Pertinent labs & imaging results that were available during my care of the patient were reviewed by me and considered in my medical decision making (see chart for details).    MDM Rules/Calculators/A&P                          Patient presents with Covid positive as well as fever, chills, cough, congestion, nausea, vomiting, diarrhea.  She is alert, did not appear acute distress, vital signs significant for fever 100.4 and tachycardia.  Will order screening labs for further evaluation and provide Tylenol for fever control.  Patient CBC does not show leukocytosis or signs of anemia, CMP shows hyperglycemia of 812, no metabolic acidosis, hypoalbuminemia 3.4, elevated AST ALT, no anion gap.  Patient has elevated lipase 52 UA negative for nitrates, leukocytes, rare bacteria.  I have low suspicion patient have to be hospitalized due to Covid as she has no new oxygen requirements, no signs of respiratory distress noted on exam, lung sounds are clear bilaterally.  I have low suspicion for severe dehydration or electrolyte abnormalities as she is tolerating p.o.  without difficulty, she was nontachycardic during exam, no dry membranes noted, she is not hemoconcentrated.  I have low suspicion for pancreatitis as she had no epigastric pain, little risk factors.  I suspect elevated lipase is secondary to nausea and vomiting.  I have low suspicion for systemic  infection as patient is nontoxic-appearing, vital signs reassuring, no obvious source infection on exam, no leukocytosis seen on CBC.  I suspect initial tachycardia and fever were secondary to her Covid infection.  Provide her with Tylenol and improved her symptoms.  Low suspicion for gallbladder or liver abnormality as she had no right upper quadrant pain, she denies alcohol use, gallstones.  I suspect slightly elevated liver enzymes are secondary to to Covid.  Will recommend continue hydration.  I suspect patient's symptoms are secondary to her Covid infection.  Will recommend H2 blockers for decongestion, Tylenol for fever control, ibuprofen for pain control.  She qualifies for the infusion clinic and I have contacted them to get her an appointment for possible treatment.   Vital signs remained stable, no indication also admission, patient was given at home care as well strict return precautions.  Patient verbalized that she understood and agreed to plan. Final Clinical Impression(s) / ED Diagnoses Final diagnoses:  COVID-19 virus infection    Rx / DC Orders ED Discharge Orders         Ordered    ondansetron (ZOFRAN) 4 MG tablet  Every 8 hours PRN        05/03/20 1500           Aron Baba 05/03/20 1502    [name], MD 05/04/20 [phone]

## 2020-05-03 NOTE — ED Triage Notes (Signed)
Pt states she tested COVID+ yesterday and has been having N/V/D since 10/2

## 2020-05-04 ENCOUNTER — Telehealth: Payer: Self-pay | Admitting: Physician Assistant

## 2020-05-04 ENCOUNTER — Ambulatory Visit (HOSPITAL_COMMUNITY)
Admission: RE | Admit: 2020-05-04 | Discharge: 2020-05-04 | Disposition: A | Discharge status: Home / Self Care | Payer: Managed Care, Other (non HMO) | Source: Ambulatory Visit | Attending: Pulmonary Disease | Admitting: Pulmonary Disease

## 2020-05-04 ENCOUNTER — Other Ambulatory Visit: Payer: Self-pay | Admitting: Physician Assistant

## 2020-05-04 DIAGNOSIS — U071 COVID-19: Secondary | ICD-10-CM

## 2020-05-04 MED ORDER — SODIUM CHLORIDE 0.9 % IV SOLN: Freq: Once | INTRAVENOUS | Status: AC

## 2020-05-04 MED ORDER — DIPHENHYDRAMINE HCL 50 MG/ML IJ SOLN: 50.0000 mg | Freq: Once | INTRAMUSCULAR | Status: DC | PRN

## 2020-05-04 MED ORDER — METHYLPREDNISOLONE SODIUM SUCC 125 MG IJ SOLR: 125.0000 mg | Freq: Once | INTRAMUSCULAR | Status: DC | PRN

## 2020-05-04 MED ORDER — FAMOTIDINE IN NACL 20-0.9 MG/50ML-% IV SOLN: 20.0000 mg | Freq: Once | INTRAVENOUS | Status: DC | PRN

## 2020-05-04 MED ORDER — ALBUTEROL SULFATE HFA 108 (90 BASE) MCG/ACT IN AERS: 2.0000 | INHALATION_SPRAY | Freq: Once | RESPIRATORY_TRACT | Status: DC | PRN

## 2020-05-04 MED ORDER — SODIUM CHLORIDE 0.9 % IV SOLN: INTRAVENOUS | Status: DC | PRN

## 2020-05-04 MED ORDER — EPINEPHRINE 0.3 MG/0.3ML IJ SOAJ: 0.3000 mg | Freq: Once | INTRAMUSCULAR | Status: DC | PRN

## 2020-05-04 NOTE — Telephone Encounter (Signed)
Called to discuss with patient about Covid symptoms and the use of casirivimab/imdevimab, a monoclonal antibody infusion for those with mild to moderate Covid symptoms and at a high risk of hospitalization.  Pt is qualified for this infusion at the [facility] due to; Specific high risk criteria : BMI > 25   Message left to call back our hotline [phone] through a text message (VM full). Also sent a mychart message   Kayia Billinger PA-C  [facility]

## 2020-05-04 NOTE — Discharge Instructions (Signed)
10 Things You Can Do to Manage Your COVID-19 Symptoms at Home If you have possible or confirmed COVID-19: 1. Stay home from work and school. And stay away from other public places. If you must go out, avoid using any kind of public transportation, ridesharing, or taxis. 2. Monitor your symptoms carefully. If your symptoms get worse, call your healthcare provider immediately. 3. Get rest and stay hydrated. 4. If you have a medical appointment, call the healthcare provider ahead of time and tell them that you have or may have COVID-19. 5. For medical emergencies, call 911 and notify the dispatch personnel that you have or may have COVID-19. 6. Cover your cough and sneezes with a tissue or use the inside of your elbow. 7. Wash your hands often with soap and water for at least 20 seconds or clean your hands with an alcohol-based hand sanitizer that contains at least 60% alcohol. 8. As much as possible, stay in a specific room and away from other people in your home. Also, you should use a separate bathroom, if available. If you need to be around other people in or outside of the home, wear a mask. 9. Avoid sharing personal items with other people in your household, like dishes, towels, and bedding. 10. Clean all surfaces that are touched often, like counters, tabletops, and doorknobs. Use household cleaning sprays or wipes according to the label instructions. [email or web address] 01/26/2019 This information is not intended to replace advice given to you by your health care provider. Make sure you discuss any questions you have with your health care provider. Document Revised: 06/30/2019 Document Reviewed: 06/30/2019 Elsevier Patient Education  2020 [place] is recommending patients who receive monoclonal antibody treatments wait at least 90 days before being vaccinated.  Currently, there are no data on the safety and efficacy of mRNA COVID-19 vaccines in persons who received monoclonal  antibodies or convalescent plasma as part of COVID-19 treatment. Based on the estimated half-life of such therapies as well as evidence suggesting that reinfection is uncommon in the 90 days after initial infection, vaccination should be deferred for at least 90 days, as a precautionary measure until additional information becomes available, to avoid interference of the antibody treatment with vaccine-induced immune responses.

## 2020-05-04 NOTE — Progress Notes (Signed)
I connected by phone with Virginia Barrett on 05/04/2020 at 2:13 PM to discuss the potential use of a new treatment for mild to moderate COVID-19 viral infection in non-hospitalized patients.  This patient is a 44 y.o. female that meets the FDA criteria for Emergency Use Authorization of COVID monoclonal antibody casirivimab/imdevimab.  Has a (+) direct SARS-CoV-2 viral test result  Has mild or moderate COVID-19   Is NOT hospitalized due to COVID-19  Is within 10 days of symptom onset  Has at least one of the high risk factor(s) for progression to severe COVID-19 and/or hospitalization as defined in EUA.  Specific high risk criteria : BMI > 25   I have spoken and communicated the following to the patient or parent/caregiver regarding COVID monoclonal antibody treatment:  1. FDA has authorized the emergency use for the treatment of mild to moderate COVID-19 in adults and pediatric patients with positive results of direct SARS-CoV-2 viral testing who are 85 years of age and older weighing at least 40 kg, and who are at high risk for progressing to severe COVID-19 and/or hospitalization.  2. The significant known and potential risks and benefits of COVID monoclonal antibody, and the extent to which such potential risks and benefits are unknown.  3. Information on available alternative treatments and the risks and benefits of those alternatives, including clinical trials.  4. Patients treated with COVID monoclonal antibody should continue to self-isolate and use infection control measures (e.g., wear mask, isolate, social distance, avoid sharing personal items, clean and disinfect "high touch" surfaces, and frequent handwashing) according to CDC guidelines.   5. The patient or parent/caregiver has the option to accept or refuse COVID monoclonal antibody treatment.  After reviewing this information with the patient, the patient has agreed to receive one of the available covid 19 monoclonal  antibodies and will be provided an appropriate fact sheet prior to infusion.  Sx onset 10/1. Set up for infusion on 10/8 @ 5:30pm. Directions given to [facility]. Pt is aware that insurance will be charged an infusion fee. Pt is unvaccinated.   Virginia Barrett 05/04/2020 2:13 PM    2

## 2020-05-04 NOTE — Progress Notes (Signed)
  Diagnosis: COVID-19  Physician:Dr Joya Gaskins  Procedure: Covid Infusion Clinic Med: casirivimab\imdevimab infusion - Provided patient with casirivimab\imdevimab fact sheet for patients, parents and caregivers prior to infusion.  Complications: No immediate complications noted.  Discharge: Discharged home   Virginia Barrett 05/04/2020

## 2020-05-10 ENCOUNTER — Telehealth: Payer: Self-pay | Admitting: General Practice

## 2020-05-10 NOTE — Telephone Encounter (Signed)
Pt was called per referral from [facility] ED to make appt w/ [facility]. lvm

## 2020-06-18 ENCOUNTER — Other Ambulatory Visit: Payer: Self-pay

## 2020-06-18 ENCOUNTER — Ambulatory Visit
Admission: EM | Admit: 2020-06-18 | Discharge: 2020-06-18 | Disposition: A | Discharge status: Home / Self Care | Payer: No Typology Code available for payment source | Attending: Emergency Medicine | Admitting: Emergency Medicine

## 2020-06-18 DIAGNOSIS — Z23 Encounter for immunization: Secondary | ICD-10-CM | POA: Diagnosis not present

## 2020-06-18 DIAGNOSIS — W57XXXA Bitten or stung by nonvenomous insect and other nonvenomous arthropods, initial encounter: Secondary | ICD-10-CM

## 2020-06-18 DIAGNOSIS — T7840XA Allergy, unspecified, initial encounter: Secondary | ICD-10-CM

## 2020-06-18 DIAGNOSIS — R2231 Localized swelling, mass and lump, right upper limb: Secondary | ICD-10-CM

## 2020-06-18 DIAGNOSIS — S60561A Insect bite (nonvenomous) of right hand, initial encounter: Secondary | ICD-10-CM

## 2020-06-18 MED ORDER — FAMOTIDINE 20 MG PO TABS: 20.0000 mg | ORAL_TABLET | Freq: Two times a day (BID) | ORAL | 0 refills | Status: DC

## 2020-06-18 MED ORDER — PREDNISONE 20 MG PO TABS: 20.0000 mg | ORAL_TABLET | Freq: Two times a day (BID) | ORAL | 0 refills | Status: AC

## 2020-06-18 MED ORDER — EPINEPHRINE 0.3 MG/0.3ML IJ SOAJ: 0.3000 mg | INTRAMUSCULAR | 0 refills | Status: DC | PRN

## 2020-06-18 MED ORDER — FAMOTIDINE 20 MG PO TABS
20.0000 mg | ORAL_TABLET | Freq: Once | ORAL | Status: AC
  Administered 2020-06-18: 20 mg via ORAL

## 2020-06-18 MED ORDER — DEXAMETHASONE SODIUM PHOSPHATE 10 MG/ML IJ SOLN
10.0000 mg | Freq: Once | INTRAMUSCULAR | Status: AC
  Administered 2020-06-18: 10 mg via INTRAMUSCULAR

## 2020-06-18 MED ORDER — TETANUS-DIPHTH-ACELL PERTUSSIS 5-2.5-18.5 LF-MCG/0.5 IM SUSY
0.5000 mL | PREFILLED_SYRINGE | Freq: Once | INTRAMUSCULAR | Status: AC
  Administered 2020-06-18: 0.5 mL via INTRAMUSCULAR

## 2020-06-18 NOTE — ED Triage Notes (Signed)
Pt has possible bee sting to right hand with severe swelling , pt has had benadryl

## 2020-06-18 NOTE — ED Provider Notes (Signed)
[place]   [id number] 06/18/20 Arrival Time: 1017  Cc: Allergic reaction  SUBJECTIVE:  Virginia Barrett is a 44 y.o. female who presents with possible allergic reaction that began last night.  Symptoms began after being stung by black hornet to LT hand.  Has swelling and redness, with intermittent sharp pain to LT hand.  Has tried OTC benadryl with minimal relief.  Symptoms are made worse with trying to make fish.  Denies previous symptoms in the past.   Denies fever, chills, nausea, vomiting, swollen glands, oral manifestations such as throat swelling/ tingling, mouth swelling/ tingling, tongue swelling/tingling, dyspnea, SOB, chest pain, abdominal pain, changes in bowel or bladder function.     ROS: As per HPI.  All other pertinent ROS negative.     Past Medical History:  Diagnosis Date  . Acute frontal sinusitis, unspecified   . Bronchitis, not specified as acute or chronic   . Constipation   . Diarrhea   . Disappearance and death of family member   . Diverticulitis   . Diverticulitis of small intestine without perforation or abscess without bleeding   . Dizziness and giddiness   . Headache(784.0)    occasional   . Insomnia, unspecified   . Lumbago   . Obesity, unspecified   . Pain in right knee   . Pneumonia    hx of walking pneumonia   . Precancerous skin lesion   . Sinus infection    diagnosed on 01/28/13    Past Surgical History:  Procedure Laterality Date  . COLONOSCOPY N/A 11/17/2012   Procedure: COLONOSCOPY;  Surgeon: [name], MD;  Location: [facility];  Service: Endoscopy;  Laterality: N/A;  1200-moved to1030 Ann to notify pt  . LAPAROSCOPIC SIGMOID COLECTOMY N/A 02/03/2013   Procedure: LAPAROSCOPIC SIGMOID COLECTOMY;  Surgeon: [name], MD;  Location: [facility];  Service: General;  Laterality: N/A;  . MOUTH SURGERY     wisdom teeth removed  . PROCTOSCOPY  02/03/2013   Procedure: PROCTOSCOPY;  Surgeon: [name], MD;  Location: [facility];   Service: General;;   Allergies  Allergen Reactions  . Cinnamon Anaphylaxis  . Sulfur Hives  . Ciprofloxacin     Rash  . Codeine Hives and Swelling  . Penicillins Hives  . Red Dye Hives   No current facility-administered medications on file prior to encounter.   Current Outpatient Medications on File Prior to Encounter  Medication Sig Dispense Refill  . acetaminophen (TYLENOL) 500 MG tablet Take 1 tablet (500 mg total) by mouth every 6 (six) hours as needed. 30 tablet 0  . atenolol (TENORMIN) 25 MG tablet Take 25 mg by mouth daily.     [name] escitalopram (LEXAPRO) 10 MG tablet Take 10 mg by mouth daily.    . hyoscyamine (LEVBID) 0.375 MG 12 hr tablet Take 1 tablet (0.375 mg total) by mouth 2 (two) times daily as needed. 60 tablet 0  . Multiple Vitamins-Minerals (AIRBORNE GUMMIES) CHEW Chew by mouth. Per patient - she takes 3 by mouth daily.    . Multiple Vitamins-Minerals (WOMENS MULTIVITAMIN PO) Take 2 tablets by mouth. Per patient this is the gummy version.    . ondansetron (ZOFRAN) 4 MG tablet Take 1 tablet (4 mg total) by mouth every 8 (eight) hours as needed for nausea or vomiting. 12 tablet 0  . polyethylene glycol (MIRALAX / GLYCOLAX) 17 g packet Take 8.5 g by mouth daily.    . [DISCONTINUED] diphenhydrAMINE (BENADRYL) 25 MG tablet  Take 1 tablet (25 mg total) by mouth every 6 (six) hours. 20 tablet 0    Social History   Socioeconomic History  . Marital status: Single    Spouse name: Not on file  . Number of children: Not on file  . Years of education: Not on file  . Highest education level: Not on file  Occupational History  . Not on file  Tobacco Use  . Smoking status: Never Smoker  . Smokeless tobacco: Never Used  Vaping Use  . Vaping Use: Never used  Substance and Sexual Activity  . Alcohol use: Yes    Comment: rarely  . Drug use: No  . Sexual activity: Never    Birth control/protection: None  Other Topics Concern  . Not on file  Social History Narrative  . Not  on file   Social Determinants of Health   Financial Resource Strain:   . Difficulty of Paying Living Expenses: Not on file  Food Insecurity:   . Worried About Charity fundraiser in the Last Year: Not on file  . Ran Out of Food in the Last Year: Not on file  Transportation Needs:   . Lack of Transportation (Medical): Not on file  . Lack of Transportation (Non-Medical): Not on file  Physical Activity:   . Days of Exercise per Week: Not on file  . Minutes of Exercise per Session: Not on file  Stress:   . Feeling of Stress : Not on file  Social Connections:   . Frequency of Communication with Friends and Family: Not on file  . Frequency of Social Gatherings with Friends and Family: Not on file  . Attends Religious Services: Not on file  . Active Member of Clubs or Organizations: Not on file  . Attends Archivist Meetings: Not on file  . Marital Status: Not on file  Intimate Partner Violence:   . Fear of Current or Ex-Partner: Not on file  . Emotionally Abused: Not on file  . Physically Abused: Not on file  . Sexually Abused: Not on file   Family History  Problem Relation Age of Onset  . Colon polyps Mother   . Diabetes Mother   . COPD Mother   . Colon polyps Other   . Hodgkin's lymphoma Father   . Cancer Maternal Aunt        breast  . Cancer Maternal Grandmother        breast  . Colon cancer Neg Hx      OBJECTIVE:  Vitals:   06/18/20 1420  BP: 137/81  Pulse: 87  Resp: 20  Temp: (!) 97.1 F (36.2 C)  SpO2: 96%     General appearance: Alert, speaking in full sentences without difficulty HEENT:NCAT; Ears: EACs clear; Eyes: PERRL.  EOM grossly intact. Tolerating own secretions Neck: supple without LAD Lungs: normal respiratory effort CV: Radial pulse 2+; cap refill < 2 seconds Skin: warm and dry; RT hand with significant swelling and erythema, NTTP, no obvious drainage or bleeding Psychological: alert and cooperative; normal mood and  affect  ASSESSMENT & PLAN:  1. Localized swelling on right hand   2. Insect bite of right hand, initial encounter   3. Allergic reaction, initial encounter     Meds ordered this encounter  Medications  . predniSONE (DELTASONE) 20 MG tablet    Sig: Take 1 tablet (20 mg total) by mouth 2 (two) times daily with a meal for 5 days.    Dispense:  10 tablet  Refill:  0    Order Specific Question:   Supervising Provider    Answer:   Raylene Everts [[id number]]  . famotidine (PEPCID) 20 MG tablet    Sig: Take 1 tablet (20 mg total) by mouth 2 (two) times daily.    Dispense:  10 tablet    Refill:  0    Order Specific Question:   Supervising Provider    Answer:   Raylene Everts [[id number]]  . dexamethasone (DECADRON) injection 10 mg  . Tdap (BOOSTRIX) injection 0.5 mL  . famotidine (PEPCID) tablet 20 mg  . EPINEPHrine 0.3 mg/0.3 mL IJ SOAJ injection    Sig: Inject 0.3 mg into the muscle as needed for anaphylaxis.    Dispense:  1 each    Refill:  0    Order Specific Question:   Supervising Provider    Answer:   Raylene Everts [[id number]]    Tetanus updated Epi-pen prescribed.   Decadron 10 mg shot given in office Famotidine 20 mg given in office.  Rest push fluids Prednisone 40 mg daily for 5 days prescribed.  Take as directed and to completion. Continue with benadryl Pepcid 20 mg twice daily for 3 days Return or go to the ED if you have any new or worsening symptoms such as difficulty breathing, shortness of breath, chest pain, nausea, vomiting, throat tightness or swelling, tongue swelling or tingling, worsening lip or facial swelling, abdominal pain, changes in bowel or bladder habits, no improvement despite medications, etc...   Reviewed expectations re: course of current medical issues. Questions answered. Outlined signs and symptoms indicating need for more acute intervention. Patient verbalized understanding. After Visit Summary given.          Lestine Box, PA-C 06/18/20 1442

## 2020-06-18 NOTE — Discharge Instructions (Addendum)
Tetanus updated Epi-pen prescribed.   Decadron 10 mg shot given in office Famotidine 20 mg given in office.  Rest push fluids Prednisone 40 mg daily for 5 days prescribed.  Take as directed and to completion. Continue with benadryl Pepcid 20 mg twice daily for 3 days Return or go to the ED if you have any new or worsening symptoms such as difficulty breathing, shortness of breath, chest pain, nausea, vomiting, throat tightness or swelling, tongue swelling or tingling, worsening lip or facial swelling, abdominal pain, changes in bowel or bladder habits, no improvement despite medications, etc..[name]

## 2020-07-10 ENCOUNTER — Encounter (INDEPENDENT_AMBULATORY_CARE_PROVIDER_SITE_OTHER): Payer: Self-pay | Admitting: Internal Medicine

## 2020-07-10 ENCOUNTER — Other Ambulatory Visit: Payer: Self-pay

## 2020-07-10 ENCOUNTER — Ambulatory Visit (INDEPENDENT_AMBULATORY_CARE_PROVIDER_SITE_OTHER): Payer: Managed Care, Other (non HMO) | Admitting: Internal Medicine

## 2020-07-10 VITALS — BP 138/81 | HR 91 | Temp 97.9°F | Ht 66.0 in | Wt 297.8 lb

## 2020-07-10 DIAGNOSIS — Z86010 Personal history of colonic polyps: Secondary | ICD-10-CM

## 2020-07-10 DIAGNOSIS — R945 Abnormal results of liver function studies: Secondary | ICD-10-CM

## 2020-07-10 DIAGNOSIS — R7989 Other specified abnormal findings of blood chemistry: Secondary | ICD-10-CM

## 2020-07-10 DIAGNOSIS — Z8719 Personal history of other diseases of the digestive system: Secondary | ICD-10-CM | POA: Insufficient documentation

## 2020-07-10 MED ORDER — METRONIDAZOLE 500 MG PO TABS: 500.0000 mg | ORAL_TABLET | Freq: Two times a day (BID) | ORAL | 0 refills | Status: DC

## 2020-07-10 MED ORDER — CIPROFLOXACIN HCL 500 MG PO TABS: 500.0000 mg | ORAL_TABLET | Freq: Two times a day (BID) | ORAL | 0 refills | Status: DC

## 2020-07-10 NOTE — Progress Notes (Signed)
Presenting complaint;  Follow-up for diverticulitis.  Database and subjective:  Patient is 44 year old Caucasian female who has a history of recurrent sigmoid diverticulitis.  She underwent laparoscopic sigmoid colon resection in July 2014.  She had no issues for 3 years.  She had an episode of diverticulitis in July 2017 and responded to antibiotic therapy.  CT confirmed this diagnosis.  She had another episode in April 2020 and she was seen in emergency room and once again CT confirmed this diagnosis.  She was doing well when she was seen 1 year ago.  Patient says she had mild episode in June 2021 and she quickly responded to antibiotic therapy.  She took antibiotics for about a week. She was diagnosed with Covid infection in October 2021 when she was seen in emergency room and received antibody infusion.  She says her symptoms were primarily nausea and vomiting leading to dehydration.  She feels she is fully recovered. Her bowels move daily with polyethylene glycol..  She denies melena or rectal bleeding.  She has not had any pain in the last 3 months and therefore has not used Levbid.  She is on a high-fiber diet and she chews her food thoroughly. She has gained 15 pounds since her last visit.  Now she is trying to lose weight.  She walks for 23 minutes at lunch at least 5 days a week.  Current Medications: Outpatient Encounter Medications as of 07/10/2020  Medication Sig  . acetaminophen (TYLENOL) 500 MG tablet Take 1 tablet (500 mg total) by mouth every 6 (six) hours as needed.  [name] EPINEPHrine 0.3 mg/0.3 mL IJ SOAJ injection Inject 0.3 mg into the muscle as needed for anaphylaxis.  [name] escitalopram (LEXAPRO) 10 MG tablet Take 10 mg by mouth daily.  . famotidine (PEPCID) 20 MG tablet Take 1 tablet (20 mg total) by mouth 2 (two) times daily.  . hyoscyamine (LEVBID) 0.375 MG 12 hr tablet Take 1 tablet (0.375 mg total) by mouth 2 (two) times daily as needed.  . Multiple Vitamins-Minerals  (AIRBORNE GUMMIES) CHEW Chew by mouth. Per patient - she takes 3 by mouth daily.  . Multiple Vitamins-Minerals (WOMENS MULTIVITAMIN PO) Take 2 tablets by mouth. Per patient this is the gummy version.  . ondansetron (ZOFRAN) 4 MG tablet Take 1 tablet (4 mg total) by mouth every 8 (eight) hours as needed for nausea or vomiting.  . [DISCONTINUED] diphenhydrAMINE (BENADRYL) 25 MG tablet Take 1 tablet (25 mg total) by mouth every 6 (six) hours.  [name] atenolol (TENORMIN) 25 MG tablet Take 25 mg by mouth daily.  (Patient not taking: Reported on 07/10/2020)  . polyethylene glycol (MIRALAX / GLYCOLAX) 17 g packet Take 8.5 g by mouth daily. (Patient not taking: Reported on 07/10/2020)  . [DISCONTINUED] diphenhydrAMINE (SOMINEX) 25 MG tablet Take by mouth. (Patient not taking: Reported on 07/10/2020)   No facility-administered encounter medications on file as of 07/10/2020.    Objective: Blood pressure 138/81, pulse 91, temperature 97.9 F (36.6 C), temperature source Oral, height _0  (1.676 m), weight 297 lb 12.8 oz (135.1 kg), last menstrual period 07/07/2020. Patient is alert and in no acute distress. She is wearing a mask. Conjunctiva is pink. Sclera is nonicteric Oropharyngeal mucosa is normal. No neck masses or thyromegaly noted. Cardiac exam with regular rhythm normal S1 and S2. No murmur or gallop noted. Lungs are clear to auscultation. Abdomen is full.  On palpation is soft and nontender without organomegaly or masses. No LE edema or clubbing noted.  Labs/studies Results:  CBC Latest Ref Rng & Units 05/03/2020 11/14/2018 02/15/2016  WBC 4.0 - 10.5 K/uL 4.3 21.3(H) 12.8(H)  Hemoglobin 12.0 - 15.0 g/dL 13.5 13.9 13.1  Hematocrit 36.0 - 46.0 % 41.0 41.9 39.6  Platelets 150 - 400 K/uL 189 371 441(H)    CMP Latest Ref Rng & Units 05/03/2020 11/14/2018 02/15/2016  Glucose 70 - 99 mg/dL 113(H) 125(H) 111(H)  BUN 6 - 20 mg/dL _0 Creatinine 0.44 - 1.00 mg/dL 0.68 0.64 0.71  Sodium 135 - 145  mmol/L 137 139 137  Potassium 3.5 - 5.1 mmol/L 3.7 3.6 3.9  Chloride 98 - 111 mmol/L 97(L) 104 103  CO2 22 - 32 mmol/L _1 Calcium 8.9 - 10.3 mg/dL 8.3(L) 9.1 8.6(L)  Total Protein 6.5 - 8.1 g/dL 6.7 7.5 7.3  Total Bilirubin 0.3 - 1.2 mg/dL 0.6 0.3 0.6  Alkaline Phos 38 - 126 U/L 32(L) 44 44  AST 15 - 41 U/L 82(H) 20 18  ALT 0 - 44 U/L 53(H) 21 22    Hepatic Function Latest Ref Rng & Units 05/03/2020 11/14/2018 02/15/2016  Total Protein 6.5 - 8.1 g/dL 6.7 7.5 7.3  Albumin 3.5 - 5.0 g/dL 3.4(L) 4.0 3.9  AST 15 - 41 U/L 82(H) 20 18  ALT 0 - 44 U/L 53(H) 21 22  Alk Phosphatase 38 - 126 U/L 32(L) 44 44  Total Bilirubin 0.3 - 1.2 mg/dL 0.6 0.3 0.6     Assessment:  #1.  History of recurrent sigmoid diverticulitis.  She is status post sigmoid colon resection in July 2014.  She is still having sporadic episodes.  She is to avoid NSAID use.  No further recommendations at this time but if she has another episode of diverticulitis would consider off label use of mesalamine.  #2.  Mildly elevated transaminases noted on review of her blood work from ER visit.  Patient was contacted with this information.  She was not aware that her LFTs were elevated.  Suspect mild injury due to Covid infection but she could also have a fatty liver.  She has received hepatitis A and B vaccination in the past.  If transaminases are elevated will pursue with further work-up   Plan:  New prescription given for Cipro and metronidazole that she will keep in case she she has another episode of diverticulitis. Patient advised to call office if she has 2 go on antibiotic. Patient will go to the lab for LFTs when convenient. Office visit in 1 year.

## 2020-07-10 NOTE — Patient Instructions (Signed)
Colonoscopy to be scheduled in near future. Please call office if you have another bout with diverticulitis and needed go on antibiotic.

## 2020-07-11 ENCOUNTER — Encounter (INDEPENDENT_AMBULATORY_CARE_PROVIDER_SITE_OTHER): Payer: Self-pay

## 2020-07-11 ENCOUNTER — Other Ambulatory Visit (INDEPENDENT_AMBULATORY_CARE_PROVIDER_SITE_OTHER): Payer: Self-pay

## 2020-07-11 ENCOUNTER — Telehealth (INDEPENDENT_AMBULATORY_CARE_PROVIDER_SITE_OTHER): Payer: Self-pay

## 2020-07-11 DIAGNOSIS — Z1211 Encounter for screening for malignant neoplasm of colon: Secondary | ICD-10-CM

## 2020-07-11 DIAGNOSIS — Z86010 Personal history of colonic polyps: Secondary | ICD-10-CM

## 2020-07-11 DIAGNOSIS — Z8719 Personal history of other diseases of the digestive system: Secondary | ICD-10-CM

## 2020-07-11 MED ORDER — NA SULFATE-K SULFATE-MG SULF 17.5-3.13-1.6 GM/177ML PO SOLN: 354.0000 mL | Freq: Once | ORAL | 0 refills | Status: AC

## 2020-07-11 NOTE — Telephone Encounter (Signed)
LeighAnn Phynix Horton, CMA  

## 2020-07-16 ENCOUNTER — Telehealth (INDEPENDENT_AMBULATORY_CARE_PROVIDER_SITE_OTHER): Payer: Self-pay

## 2020-07-16 ENCOUNTER — Encounter (INDEPENDENT_AMBULATORY_CARE_PROVIDER_SITE_OTHER): Payer: Self-pay

## 2020-07-16 DIAGNOSIS — Z1211 Encounter for screening for malignant neoplasm of colon: Secondary | ICD-10-CM

## 2020-07-16 MED ORDER — NA SULFATE-K SULFATE-MG SULF 17.5-3.13-1.6 GM/177ML PO SOLN: 354.0000 mL | Freq: Once | ORAL | 0 refills | Status: AC

## 2020-07-16 NOTE — Telephone Encounter (Signed)
Virginia Barrett, CMA  

## 2020-07-17 ENCOUNTER — Encounter (INDEPENDENT_AMBULATORY_CARE_PROVIDER_SITE_OTHER): Payer: Self-pay

## 2020-07-26 NOTE — Patient Instructions (Signed)
Your procedure is scheduled on: 08/01/2020  Report to [facility] at   6:15  AM.  Call this number if you have problems the morning of surgery: [phone]   Remember:              Follow Directions on the letter you received from Your Physician's office regarding the Bowel Prep              No Smoking the day of Procedure :   Take these medicines the morning of surgery with A SIP OF WATER: Lexapro, Pepcid, and Zofran    Do not wear jewelry, make-up or nail polish.    Do not bring valuables to the hospital.  Contacts, dentures or bridgework may not be worn into surgery.  .   Patients discharged the day of surgery will not be allowed to drive home.     Colonoscopy, Adult, Care After This sheet gives you information about how to care for yourself after your procedure. Your health care provider may also give you more specific instructions. If you have problems or questions, contact your health care provider. What can I expect after the procedure? After the procedure, it is common to have:  A small amount of blood in your stool for 24 hours after the procedure.  Some gas.  Mild abdominal cramping or bloating.  Follow these instructions at home: General instructions   For the first 24 hours after the procedure: ? Do not drive or use machinery. ? Do not sign important documents. ? Do not drink alcohol. ? Do your regular daily activities at a slower pace than normal. ? Eat soft, easy-to-digest foods. ? Rest often.  Take over-the-counter or prescription medicines only as told by your health care provider.  It is up to you to get the results of your procedure. Ask your health care provider, or the department performing the procedure, when your results will be ready. Relieving cramping and bloating  Try walking around when you have cramps or feel bloated.  Apply heat to your abdomen as told by your health care provider. Use a heat source that your health care provider  recommends, such as a moist heat pack or a heating pad. ? Place a towel between your skin and the heat source. ? Leave the heat on for 20-30 minutes. ? Remove the heat if your skin turns bright red. This is especially important if you are unable to feel pain, heat, or cold. You may have a greater risk of getting burned. Eating and drinking  Drink enough fluid to keep your urine clear or pale yellow.  Resume your normal diet as instructed by your health care provider. Avoid heavy or fried foods that are hard to digest.  Avoid drinking alcohol for as long as instructed by your health care provider. Contact a health care provider if:  You have blood in your stool 2-3 days after the procedure. Get help right away if:  You have more than a small spotting of blood in your stool.  You pass large blood clots in your stool.  Your abdomen is swollen.  You have nausea or vomiting.  You have a fever.  You have increasing abdominal pain that is not relieved with medicine. This information is not intended to replace advice given to you by your health care provider. Make sure you discuss any questions you have with your health care provider. Document Released: 02/26/2004 Document Revised: 04/07/2016 Document Reviewed: 09/25/2015 Elsevier Interactive Patient Education  2018 [employer].

## 2020-07-30 ENCOUNTER — Encounter (HOSPITAL_COMMUNITY)
Admission: RE | Admit: 2020-07-30 | Discharge: 2020-07-30 | Disposition: A | Discharge status: Home / Self Care | Payer: 59 | Source: Ambulatory Visit | Attending: Internal Medicine | Admitting: Internal Medicine

## 2020-07-30 ENCOUNTER — Encounter (HOSPITAL_COMMUNITY): Payer: Self-pay

## 2020-07-30 ENCOUNTER — Other Ambulatory Visit: Payer: Self-pay

## 2020-07-30 ENCOUNTER — Other Ambulatory Visit (HOSPITAL_COMMUNITY)
Admission: RE | Admit: 2020-07-30 | Discharge: 2020-07-30 | Disposition: A | Discharge status: Home / Self Care | Payer: Managed Care, Other (non HMO) | Source: Ambulatory Visit | Attending: Internal Medicine | Admitting: Internal Medicine

## 2020-07-30 DIAGNOSIS — Z20822 Contact with and (suspected) exposure to covid-19: Secondary | ICD-10-CM | POA: Insufficient documentation

## 2020-07-30 DIAGNOSIS — Z01812 Encounter for preprocedural laboratory examination: Secondary | ICD-10-CM | POA: Insufficient documentation

## 2020-07-30 LAB — PREGNANCY, URINE: Preg Test, Ur: NEGATIVE

## 2020-07-31 LAB — SARS CORONAVIRUS 2 (TAT 6-24 HRS): SARS Coronavirus 2: NEGATIVE

## 2020-08-01 ENCOUNTER — Ambulatory Visit (HOSPITAL_COMMUNITY): Payer: Managed Care, Other (non HMO) | Admitting: Certified Registered"

## 2020-08-01 ENCOUNTER — Encounter (HOSPITAL_COMMUNITY): Payer: Self-pay | Admitting: Internal Medicine

## 2020-08-01 ENCOUNTER — Encounter (HOSPITAL_COMMUNITY)
Admission: RE | Disposition: A | Discharge status: Home / Self Care | Payer: Self-pay | Source: Home / Self Care | Attending: Internal Medicine

## 2020-08-01 ENCOUNTER — Ambulatory Visit (HOSPITAL_COMMUNITY)
Admission: RE | Admit: 2020-08-01 | Discharge: 2020-08-01 | Disposition: A | Discharge status: Home / Self Care | Payer: Managed Care, Other (non HMO) | Attending: Internal Medicine | Admitting: Internal Medicine

## 2020-08-01 DIAGNOSIS — Z79899 Other long term (current) drug therapy: Secondary | ICD-10-CM | POA: Insufficient documentation

## 2020-08-01 DIAGNOSIS — Z1211 Encounter for screening for malignant neoplasm of colon: Secondary | ICD-10-CM | POA: Insufficient documentation

## 2020-08-01 DIAGNOSIS — K5730 Diverticulosis of large intestine without perforation or abscess without bleeding: Secondary | ICD-10-CM | POA: Diagnosis not present

## 2020-08-01 DIAGNOSIS — Z86010 Personal history of colonic polyps: Secondary | ICD-10-CM | POA: Insufficient documentation

## 2020-08-01 DIAGNOSIS — Z980 Intestinal bypass and anastomosis status: Secondary | ICD-10-CM | POA: Insufficient documentation

## 2020-08-01 DIAGNOSIS — Z885 Allergy status to narcotic agent status: Secondary | ICD-10-CM | POA: Diagnosis not present

## 2020-08-01 DIAGNOSIS — Z09 Encounter for follow-up examination after completed treatment for conditions other than malignant neoplasm: Secondary | ICD-10-CM | POA: Diagnosis not present

## 2020-08-01 DIAGNOSIS — Z881 Allergy status to other antibiotic agents status: Secondary | ICD-10-CM | POA: Insufficient documentation

## 2020-08-01 DIAGNOSIS — Z880 Allergy status to penicillin: Secondary | ICD-10-CM | POA: Insufficient documentation

## 2020-08-01 DIAGNOSIS — K621 Rectal polyp: Secondary | ICD-10-CM | POA: Insufficient documentation

## 2020-08-01 DIAGNOSIS — Z87892 Personal history of anaphylaxis: Secondary | ICD-10-CM | POA: Diagnosis not present

## 2020-08-01 DIAGNOSIS — Z8719 Personal history of other diseases of the digestive system: Secondary | ICD-10-CM

## 2020-08-01 HISTORY — PX: BIOPSY: SHX5522

## 2020-08-01 HISTORY — PX: COLONOSCOPY WITH PROPOFOL: SHX5780

## 2020-08-01 LAB — HM COLONOSCOPY

## 2020-08-01 SURGERY — COLONOSCOPY WITH PROPOFOL
Anesthesia: General

## 2020-08-01 MED ORDER — LACTATED RINGERS IV SOLN: INTRAVENOUS | Status: DC

## 2020-08-01 MED ORDER — LACTATED RINGERS IV SOLN: INTRAVENOUS | Status: DC | PRN

## 2020-08-01 MED ORDER — LIDOCAINE HCL (CARDIAC) PF 100 MG/5ML IV SOSY
PREFILLED_SYRINGE | INTRAVENOUS | Status: DC | PRN
  Administered 2020-08-01: 50 mg via INTRAVENOUS

## 2020-08-01 MED ORDER — PROPOFOL 500 MG/50ML IV EMUL
INTRAVENOUS | Status: DC | PRN
  Administered 2020-08-01: 150 ug/kg/min via INTRAVENOUS

## 2020-08-01 MED ORDER — PROPOFOL 10 MG/ML IV BOLUS
INTRAVENOUS | Status: DC | PRN
  Administered 2020-08-01: 50 mg via INTRAVENOUS
  Administered 2020-08-01: 100 mg via INTRAVENOUS
  Administered 2020-08-01: 60 mg via INTRAVENOUS
  Administered 2020-08-01: 40 mg via INTRAVENOUS

## 2020-08-01 NOTE — Op Note (Signed)
[facility] Patient Name: Virginia Barrett Procedure Date: 08/01/2020 7:23 AM MRN: [id number] Date of Birth: 30-Dec-1975 Attending MD: Lionel December , MD CSN: [id number] Age: 45 Admit Type: Outpatient Procedure:                Colonoscopy Indications:              High risk colon cancer surveillance: Personal                            history of colonic polyps Providers:                Lionel December, MD, Jannett Celestine, RN, Edythe Clarity,                            Technician Referring MD:              Medicines:                Propofol per Anesthesia Complications:            No immediate complications. Estimated Blood Loss:     Estimated blood loss was minimal. Procedure:                Pre-Anesthesia Assessment:                           - Prior to the procedure, a History and Physical                            was performed, and patient medications and                            allergies were reviewed. The patient's tolerance of                            previous anesthesia was also reviewed. The risks                            and benefits of the procedure and the sedation                            options and risks were discussed with the patient.                            All questions were answered, and informed consent                            was obtained. Prior Anticoagulants: The patient has                            taken no previous anticoagulant or antiplatelet                            agents. ASA Grade Assessment: II - A patient with  mild systemic disease. After reviewing the risks                            and benefits, the patient was deemed in                            satisfactory condition to undergo the procedure.                           After obtaining informed consent, the colonoscope                            was passed under direct vision. Throughout the                            procedure, the patient's blood pressure,  pulse, and                            oxygen saturations were monitored continuously. The                            [id number]-[id number]) scope was introduced through                            the anus and advanced to the the cecum, identified                            by appendiceal orifice and ileocecal valve. The                            colonoscopy was performed without difficulty. The                            patient tolerated the procedure well. The quality                            of the bowel preparation was good. The ileocecal                            valve, appendiceal orifice, and rectum were                            photographed. Scope In: 7:38:17 AM Scope Out: 7:52:23 AM Scope Withdrawal Time: 0 hours 10 minutes 24 seconds  Total Procedure Duration: 0 hours 14 minutes 6 seconds  Findings:      The perianal and digital rectal examinations were normal.      Scattered diverticula were found in the sigmoid colon, descending colon,       hepatic flexure and ascending colon.      There was evidence of a prior end-to-end ileo-anal anastomosis in the       recto-sigmoid colon. This was patent and was characterized by healthy       appearing mucosa.      Two polyps were found in the rectum. The polyps were small in size.  These were biopsied with a cold forceps for histology.      No additional abnormalities were found on retroflexion. Impression:               - Diverticulosis in the sigmoid colon, in the                            descending colon, at the hepatic flexure and in the                            ascending colon.                           - Patent end-to-end ileo-anal anastomosis,                            characterized by healthy appearing mucosa.                           - Two small polyps in the rectum. Biopsied. Moderate Sedation:      Per Anesthesia Care Recommendation:           - Patient has a contact number available for                             emergencies. The signs and symptoms of potential                            delayed complications were discussed with the                            patient. Return to normal activities tomorrow.                            Written discharge instructions were provided to the                            patient.                           - High fiber diet today.                           - Continue present medications.                           - Await pathology results.                           - No aspirin, ibuprofen, naproxen, or other                            non-steroidal anti-inflammatory drugs for 1 day.                           - Repeat colonoscopy is recommended. The  colonoscopy date will be determined after pathology                            results from today's exam become available for                            review. Procedure Code(s):        --- Professional ---                           [phone], Colonoscopy, flexible; with biopsy, single                            or multiple Diagnosis Code(s):        --- Professional ---                           Z86.010, Personal history of colonic polyps                           Z98.0, Intestinal bypass and anastomosis status                           K62.1, Rectal polyp                           K57.30, Diverticulosis of large intestine without                            perforation or abscess without bleeding CPT copyright 2019 [employer]. All rights reserved. The codes documented in this report are preliminary and upon coder review may  be revised to meet current compliance requirements. Hildred Laser, MD Hildred Laser, MD 08/01/2020 8:11:31 AM This report has been signed electronically. Number of Addenda: 0

## 2020-08-01 NOTE — Discharge Instructions (Signed)
No aspirin or NSAIDs for 24 hours. Resume usual medications and high-fiber diet No driving for 24 hours. Physician will call with biopsy results.      Colon Polyps  Polyps are tissue growths inside the body. Polyps can grow in many places, including the large intestine (colon). A polyp may be a round bump or a mushroom-shaped growth. You could have one polyp or several. Most colon polyps are noncancerous (benign). However, some colon polyps can become cancerous over time. Finding and removing the polyps early can help prevent this. What are the causes? The exact cause of colon polyps is not known. What increases the risk? You are more likely to develop this condition if you:  Have a family history of colon cancer or colon polyps.  Are older than 50 or older than 45 if you are African American.  Have inflammatory bowel disease, such as ulcerative colitis or Crohn's disease.  Have certain hereditary conditions, such as: ? Familial adenomatous polyposis. ? Lynch syndrome. ? Turcot syndrome. ? Peutz-Jeghers syndrome.  Are overweight.  Smoke cigarettes.  Do not get enough exercise.  Drink too much alcohol.  Eat a diet that is high in fat and red meat and low in fiber.  Had childhood cancer that was treated with abdominal radiation. What are the signs or symptoms? Most polyps do not cause symptoms. If you have symptoms, they may include:  Blood coming from your rectum when having a bowel movement.  Blood in your stool. The stool may look dark red or black.  Abdominal pain.  A change in bowel habits, such as constipation or diarrhea. How is this diagnosed? This condition is diagnosed with a colonoscopy. This is a procedure in which a lighted, flexible scope is inserted into the anus and then passed into the colon to examine the area. Polyps are sometimes found when a colonoscopy is done as part of routine cancer screening tests. How is this treated? Treatment for this  condition involves removing any polyps that are found. Most polyps can be removed during a colonoscopy. Those polyps will then be tested for cancer. Additional treatment may be needed depending on the results of testing. Follow these instructions at home: Lifestyle  Maintain a healthy weight, or lose weight if recommended by your health care provider.  Exercise every day or as told by your health care provider.  Do not use any products that contain nicotine or tobacco, such as cigarettes and e-cigarettes. If you need help quitting, ask your health care provider.  If you drink alcohol, limit how much you have: ? 0-1 drink a day for women. ? 0-2 drinks a day for men.  Be aware of how much alcohol is in your drink. In the [place] one drink equals one 12 oz bottle of beer (355 mL), one 5 oz glass of wine (148 mL), or one 1 oz shot of hard liquor (44 mL). Eating and drinking   Eat foods that are high in fiber, such as fruits, vegetables, and whole grains.  Eat foods that are high in calcium and vitamin D, such as milk, cheese, yogurt, eggs, liver, fish, and broccoli.  Limit foods that are high in fat, such as fried foods and desserts.  Limit the amount of red meat and processed meat you eat, such as hot dogs, sausage, bacon, and lunch meats. General instructions  Keep all follow-up visits as told by your health care provider. This is important. ? This includes having regularly scheduled colonoscopies. ? Talk to  your health care provider about when you need a colonoscopy. Contact a health care provider if:  You have new or worsening bleeding during a bowel movement.  You have new or increased blood in your stool.  You have a change in bowel habits.  You lose weight for no known reason. Summary  Polyps are tissue growths inside the body. Polyps can grow in many places, including the colon.  Most colon polyps are noncancerous (benign), but some can become cancerous over  time.  This condition is diagnosed with a colonoscopy.  Treatment for this condition involves removing any polyps that are found. Most polyps can be removed during a colonoscopy. This information is not intended to replace advice given to you by your health care provider. Make sure you discuss any questions you have with your health care provider. Document Revised: 10/29/2017 Document Reviewed: 10/29/2017 Elsevier Patient Education  [place].  High-Fiber Diet Fiber, also called dietary fiber, is a type of carbohydrate that is found in fruits, vegetables, whole grains, and beans. A high-fiber diet can have many health benefits. Your health care provider may recommend a high-fiber diet to help:  Prevent constipation. Fiber can make your bowel movements more regular.  Lower your cholesterol.  Relieve the following conditions: ? Swelling of veins in the anus (hemorrhoids). ? Swelling and irritation (inflammation) of specific areas of the digestive tract (uncomplicated diverticulosis). ? A problem of the large intestine (colon) that sometimes causes pain and diarrhea (irritable bowel syndrome, IBS).  Prevent overeating as part of a weight-loss plan.  Prevent heart disease, type 2 diabetes, and certain cancers. What is my plan? The recommended daily fiber intake in grams (g) includes:  38 g for men age 82 or younger.  30 g for men over age 73.  10 g for women age 49 or younger.  21 g for women over age 84. You can get the recommended daily intake of dietary fiber by:  Eating a variety of fruits, vegetables, grains, and beans.  Taking a fiber supplement, if it is not possible to get enough fiber through your diet. What do I need to know about a high-fiber diet?  It is better to get fiber through food sources rather than from fiber supplements. There is not a lot of research about how effective supplements are.  Always check the fiber content on the nutrition facts label  of any prepackaged food. Look for foods that contain 5 g of fiber or more per serving.  Talk with a diet and nutrition specialist (dietitian) if you have questions about specific foods that are recommended or not recommended for your medical condition, especially if those foods are not listed below.  Gradually increase how much fiber you consume. If you increase your intake of dietary fiber too quickly, you may have bloating, cramping, or gas.  Drink plenty of water. Water helps you to digest fiber. What are tips for following this plan?  Eat a wide variety of high-fiber foods.  Make sure that half of the grains that you eat each day are whole grains.  Eat breads and cereals that are made with whole-grain flour instead of refined flour or white flour.  Eat brown rice, bulgur wheat, or millet instead of white rice.  Start the day with a breakfast that is high in fiber, such as a cereal that contains 5 g of fiber or more per serving.  Use beans in place of meat in soups, salads, and pasta dishes.  Eat  high-fiber snacks, such as berries, raw vegetables, nuts, and popcorn.  Choose whole fruits and vegetables instead of processed forms like juice or sauce. What foods can I eat?  Fruits Berries. Pears. Apples. Oranges. Avocado. Prunes and raisins. Dried figs. Vegetables Sweet potatoes. Spinach. Kale. Artichokes. Cabbage. Broccoli. Cauliflower. Green peas. Carrots. Squash. Grains Whole-grain breads. Multigrain cereal. Oats and oatmeal. Brown rice. Barley. Bulgur wheat. [place]. Quinoa. Bran muffins. Popcorn. Rye wafer crackers. Meats and other proteins Navy, kidney, and pinto beans. Soybeans. Split peas. Lentils. Nuts and seeds. Dairy Fiber-fortified yogurt. Beverages Fiber-fortified soy milk. Fiber-fortified orange juice. Other foods Fiber bars. The items listed above may not be a complete list of recommended foods and beverages. Contact a dietitian for more options. What foods are  not recommended? Fruits Fruit juice. Cooked, strained fruit. Vegetables Fried potatoes. Canned vegetables. Well-cooked vegetables. Grains White bread. Pasta made with refined flour. White rice. Meats and other proteins Fatty cuts of meat. Fried chicken or fried fish. Dairy Milk. Yogurt. Cream cheese. Sour cream. Fats and oils Butters. Beverages Soft drinks. Other foods Cakes and pastries. The items listed above may not be a complete list of foods and beverages to avoid. Contact a dietitian for more information. Summary  Fiber is a type of carbohydrate. It is found in fruits, vegetables, whole grains, and beans.  There are many health benefits of eating a high-fiber diet, such as preventing constipation, lowering blood cholesterol, helping with weight loss, and reducing your risk of heart disease, diabetes, and certain cancers.  Gradually increase your intake of fiber. Increasing too fast can result in cramping, bloating, and gas. Drink plenty of water while you increase your fiber.  The best sources of fiber include whole fruits and vegetables, whole grains, nuts, seeds, and beans. This information is not intended to replace advice given to you by your health care provider. Make sure you discuss any questions you have with your health care provider. Document Revised: 05/18/2017 Document Reviewed: 05/18/2017 Elsevier Patient Education  [place].  Colonoscopy, Adult, Care After This sheet gives you information about how to care for yourself after your procedure. Your doctor may also give you more specific instructions. If you have problems or questions, call your doctor. What can I expect after the procedure? After the procedure, it is common to have:  A small amount of blood in your poop (stool) for 24 hours.  Some gas.  Mild cramping or bloating in your belly (abdomen). Follow these instructions at home: Eating and drinking   Drink enough fluid to keep your pee  (urine) pale yellow.  Follow instructions from your doctor about what you cannot eat or drink.  Return to your normal diet as told by your doctor. Avoid heavy or fried foods that are hard to digest. Activity  Rest as told by your doctor.  Do not sit for a long time without moving. Get up to take short walks every 1-2 hours. This is important. Ask for help if you feel weak or unsteady.  Return to your normal activities as told by your doctor. Ask your doctor what activities are safe for you. To help cramping and bloating:   Try walking around.  Put heat on your belly as told by your doctor. Use the heat source that your doctor recommends, such as a moist heat pack or a heating pad. ? Put a towel between your skin and the heat source. ? Leave the heat on for 20-30 minutes. ? Remove the heat if your  skin turns bright red. This is very important if you are unable to feel pain, heat, or cold. You may have a greater risk of getting burned. General instructions  For the first 24 hours after the procedure: ? Do not drive or use machinery. ? Do not sign important documents. ? Do not drink alcohol. ? Do your daily activities more slowly than normal. ? Eat foods that are soft and easy to digest.  Take over-the-counter or prescription medicines only as told by your doctor.  Keep all follow-up visits as told by your doctor. This is important. Contact a doctor if:  You have blood in your poop 2-3 days after the procedure. Get help right away if:  You have more than a small amount of blood in your poop.  You see large clumps of tissue (blood clots) in your poop.  Your belly is swollen.  You feel like you may vomit (nauseous).  You vomit.  You have a fever.  You have belly pain that gets worse, and medicine does not help your pain. Summary  After the procedure, it is common to have a small amount of blood in your poop. You may also have mild cramping and bloating in your  belly.  For the first 24 hours after the procedure, do not drive or use machinery, do not sign important documents, and do not drink alcohol.  Get help right away if you have a lot of blood in your poop, feel like you may vomit, have a fever, or have more belly pain. This information is not intended to replace advice given to you by your health care provider. Make sure you discuss any questions you have with your health care provider. Document Revised: 02/07/2019 Document Reviewed: 02/07/2019 Elsevier Patient Education  [place].

## 2020-08-01 NOTE — Anesthesia Procedure Notes (Signed)
Date/Time: 08/01/2020 7:40 AM Performed by: Julian Reil, CRNA Pre-anesthesia Checklist: Patient identified, Emergency Drugs available, Suction available and Patient being monitored Patient Re-evaluated:Patient Re-evaluated prior to induction Oxygen Delivery Method: Non-rebreather mask Induction Type: IV induction Placement Confirmation: positive ETCO2

## 2020-08-01 NOTE — H&P (Signed)
Virginia Barrett is an 45 y.o. female.   Chief Complaint: Patient is here for colonoscopy. HPI: Patient is 45 year old Caucasian female who has a history of tubulovillous adenoma and is here for surveillance colonoscopy.  Her last exam was in April 2014 with removal of 7 mm tubulovillous adenoma.  She was advised to return in 7 years.  She has history of diverticulitis.  She is status post sigmoid colon 2014.  She was treated for diverticulitis twice last year.  Presently she is not having any GI symptoms.  She denies abdominal pain diarrhea constipation or rectal bleeding.  Her family history is negative for CRC. Does not take aspirin or anticoagulants.  Past Medical History:  Diagnosis Date  . Acute frontal sinusitis, unspecified   . Bronchitis, not specified as acute or chronic   .    .    . Disappearance and death of family member   . Diverticulitis   . Diverticulitis of small intestine without perforation or abscess without bleeding   . Dizziness and giddiness   . Headache(784.0)    occasional   . Insomnia, unspecified   . Lumbago   . Obesity, unspecified   . Pain in right knee   . Pneumonia    hx of walking pneumonia   . Precancerous skin lesion   . Sinus infection    diagnosed on 01/28/13     Past Surgical History:  Procedure Laterality Date  . COLONOSCOPY N/A 11/17/2012   Procedure: COLONOSCOPY;  Surgeon: Malissa Hippo, MD;  Location: [facility];  Service: Endoscopy;  Laterality: N/A;  1200-moved to1030 Ann to notify pt  . LAPAROSCOPIC SIGMOID COLECTOMY N/A 02/03/2013   Procedure: LAPAROSCOPIC SIGMOID COLECTOMY;  Surgeon: Atilano Ina, MD;  Location: [facility];  Service: General;  Laterality: N/A;  . MOUTH SURGERY     wisdom teeth removed  . PROCTOSCOPY  02/03/2013   Procedure: PROCTOSCOPY;  Surgeon: Atilano Ina, MD;  Location: [facility];  Service: General;;    Family History  Problem Relation Age of Onset  . Colon polyps Mother   . Diabetes Mother   . COPD Mother   .  Colon polyps Other   . Hodgkin's lymphoma Father   . Cancer Maternal Aunt        breast  . Cancer Maternal Grandmother        breast  . Colon cancer Neg Hx    Social History:  reports that she has never smoked. She has never used smokeless tobacco. She reports current alcohol use. She reports that she does not use drugs.  Allergies:  Allergies  Allergen Reactions  . Cinnamon Anaphylaxis  . Sulfur Hives  . Codeine Hives and Swelling  . Penicillins Hives  . Red Dye Hives  . Ciprofloxacin Rash    Medications Prior to Admission  Medication Sig Dispense Refill  . escitalopram (LEXAPRO) 10 MG tablet Take 10 mg by mouth daily.    [name] acetaminophen (TYLENOL) 500 MG tablet Take 1 tablet (500 mg total) by mouth every 6 (six) hours as needed. 30 tablet 0  . ciprofloxacin (CIPRO) 500 MG tablet Take 1 tablet (500 mg total) by mouth 2 (two) times daily. (Patient not taking: Reported on 07/30/2020) 20 tablet 0  . EPINEPHrine 0.3 mg/0.3 mL IJ SOAJ injection Inject 0.3 mg into the muscle as needed for anaphylaxis. 1 each 0  . famotidine (PEPCID) 20 MG tablet Take 1 tablet (20 mg total) by mouth 2 (two) times daily. (Patient  not taking: Reported on 07/30/2020) 10 tablet 0  . hyoscyamine (LEVBID) 0.375 MG 12 hr tablet Take 1 tablet (0.375 mg total) by mouth 2 (two) times daily as needed. (Patient not taking: Reported on 07/30/2020) 60 tablet 0  . metroNIDAZOLE (FLAGYL) 500 MG tablet Take 1 tablet (500 mg total) by mouth 2 (two) times daily. (Patient not taking: Reported on 07/30/2020) 20 tablet 0  . ondansetron (ZOFRAN) 4 MG tablet Take 1 tablet (4 mg total) by mouth every 8 (eight) hours as needed for nausea or vomiting. (Patient not taking: Reported on 07/30/2020) 12 tablet 0    Results for orders placed or performed during the hospital encounter of 07/30/20 (from the past 48 hour(s))  Pregnancy, urine     Status: None   Collection Time: 07/30/20  3:34 PM  Result Value Ref Range   Preg Test, Ur NEGATIVE  NEGATIVE    Comment:        THE SENSITIVITY OF THIS METHODOLOGY IS >20 mIU/mL. Performed at [facility], [address] [place], [place] [address]   SARS CORONAVIRUS 2 (TAT 6-24 HRS) Nasopharyngeal Nasopharyngeal Swab     Status: None   Collection Time: 07/30/20  3:34 PM   Specimen: Nasopharyngeal Swab  Result Value Ref Range   SARS Coronavirus 2 NEGATIVE NEGATIVE    Comment: (NOTE) SARS-CoV-2 target nucleic acids are NOT DETECTED.  The SARS-CoV-2 RNA is generally detectable in upper and lower respiratory specimens during the acute phase of infection. Negative results do not preclude SARS-CoV-2 infection, do not rule out co-infections with other pathogens, and should not be used as the sole basis for treatment or other patient management decisions. Negative results must be combined with clinical observations, patient history, and epidemiological information. The expected result is Negative.  Fact Sheet for Patients: SugarRoll.be  Fact Sheet for Healthcare Providers: https://www.woods-mathews.com/  This test is not yet approved or cleared by the [place] FDA and  has been authorized for detection and/or diagnosis of SARS-CoV-2 by FDA under an Emergency Use Authorization (EUA). This EUA will remain  in effect (meaning this test can be used) for the duration of the COVID-19 declaration under Se ction 564(b)(1) of the Act, 21 U.S.C. section 360bbb-3(b)(1), unless the authorization is terminated or revoked sooner.  Performed at [facility], [place] [address] [place], [place] [address]    No results found.  Review of Systems  Blood pressure 125/84, pulse 87, temperature 98.4 F (36.9 C), resp. rate 16, last menstrual period 07/30/2020, SpO2 99 %. Physical Exam HENT:     Mouth/Throat:     Mouth: Mucous membranes are moist.     Pharynx: Oropharynx is clear.  Eyes:     General: No scleral icterus.    Conjunctiva/sclera:  Conjunctivae normal.  Cardiovascular:     Rate and Rhythm: Normal rate and regular rhythm.     Heart sounds: Normal heart sounds. No murmur heard.   Pulmonary:     Effort: Pulmonary effort is normal.     Breath sounds: Normal breath sounds.  Abdominal:     Comments: Abdomen is full.  There is small vertical scar in hypogastric region along with laparoscopy scars.  Abdomen is soft and nontender with organomegaly or masses  Musculoskeletal:     Cervical back: Neck supple.     Comments: She has nonpitting edema to both pretibial regions.  Lymphadenopathy:     Cervical: No cervical adenopathy.  Skin:    General: Skin is warm and dry.  Neurological:     Mental Status: She is alert.      Assessment/Plan  History of tubulovillous adenoma. Surveillance colonoscopy.  Hildred Laser, MD 08/01/2020, 7:20 AM

## 2020-08-01 NOTE — Transfer of Care (Signed)
Immediate Anesthesia Transfer of Care Note  Patient: Virginia Barrett  Procedure(s) Performed: COLONOSCOPY WITH PROPOFOL (N/A ) BIOPSY  Patient Location: PACU  Anesthesia Type:General  Level of Consciousness: awake and oriented  Airway & Oxygen Therapy: Patient Spontanous Breathing  Post-op Assessment: Report given to RN and Post -op Vital signs reviewed and stable  Post vital signs: Reviewed and stable  Last Vitals:  Vitals Value Taken Time  BP    Temp    Pulse 94 08/01/20 0757  Resp 17 08/01/20 0757  SpO2 98 % 08/01/20 0757  Vitals shown include unvalidated device data.  Last Pain:  Vitals:   08/01/20 0645  PainSc: 0-No pain         Complications: No complications documented.

## 2020-08-01 NOTE — Anesthesia Preprocedure Evaluation (Addendum)
Anesthesia Evaluation  Patient identified by MRN, date of birth, ID band Patient awake    Reviewed: Allergy & Precautions, H&P , NPO status , Patient's Chart, lab work & pertinent test results, reviewed documented beta blocker date and time   Airway Mallampati: II  TM Distance: >3 FB Neck ROM: full    Dental no notable dental hx. (+) Teeth Intact   Pulmonary pneumonia, resolved,    Pulmonary exam normal breath sounds clear to auscultation       Cardiovascular Exercise Tolerance: Good negative cardio ROS   Rhythm:regular Rate:Normal     Neuro/Psych  Headaches, negative psych ROS   GI/Hepatic negative GI ROS, Neg liver ROS,   Endo/Other  negative endocrine ROS  Renal/GU negative Renal ROS  negative genitourinary   Musculoskeletal   Abdominal   Peds  Hematology negative hematology ROS (+)   Anesthesia Other Findings   Reproductive/Obstetrics negative OB ROS                             Anesthesia Physical Anesthesia Plan  ASA: II  Anesthesia Plan: General   Post-op Pain Management:    Induction:   PONV Risk Score and Plan: Propofol infusion  Airway Management Planned:   Additional Equipment:   Intra-op Plan:   Post-operative Plan:   Informed Consent: I have reviewed the patients History and Physical, chart, labs and discussed the procedure including the risks, benefits and alternatives for the proposed anesthesia with the patient or authorized representative who has indicated his/her understanding and acceptance.     Dental Advisory Given  Plan Discussed with: CRNA  Anesthesia Plan Comments:         Anesthesia Quick Evaluation

## 2020-08-01 NOTE — Anesthesia Postprocedure Evaluation (Signed)
Anesthesia Post Note  Patient: Virginia Barrett  Procedure(s) Performed: COLONOSCOPY WITH PROPOFOL (N/A ) BIOPSY  Patient location during evaluation: PACU Anesthesia Type: General Level of consciousness: awake and alert and oriented Pain management: pain level controlled Vital Signs Assessment: post-procedure vital signs reviewed and stable Respiratory status: spontaneous breathing, nonlabored ventilation and respiratory function stable Cardiovascular status: blood pressure returned to baseline and stable Postop Assessment: no apparent nausea or vomiting Anesthetic complications: no   No complications documented.   Last Vitals:  Vitals:   08/01/20 0800 08/01/20 0830  BP: 108/87 132/90  Pulse: 89 67  Resp: 16 16  Temp:  36.7 C  SpO2: 100% 98%    Last Pain:  Vitals:   08/01/20 0830  TempSrc: Oral  PainSc: 0-No pain                 Julian Reil

## 2020-08-02 LAB — SURGICAL PATHOLOGY

## 2020-08-03 ENCOUNTER — Encounter (HOSPITAL_COMMUNITY): Payer: Self-pay | Admitting: Internal Medicine

## 2020-08-20 ENCOUNTER — Other Ambulatory Visit (HOSPITAL_COMMUNITY): Payer: Managed Care, Other (non HMO)

## 2020-08-20 ENCOUNTER — Encounter (INDEPENDENT_AMBULATORY_CARE_PROVIDER_SITE_OTHER): Payer: Self-pay | Admitting: *Deleted

## 2020-08-21 ENCOUNTER — Telehealth (INDEPENDENT_AMBULATORY_CARE_PROVIDER_SITE_OTHER): Payer: Self-pay | Admitting: Internal Medicine

## 2020-08-21 NOTE — Telephone Encounter (Signed)
Patient left voice mail message stating that last time she saw Dr [name] he had mentioned doing some lab work - please advise - ph# [phone]

## 2020-08-21 NOTE — Telephone Encounter (Signed)
I called and left a message on patient vm, as in last office note the patient was to get LFT's at her convenience. Lab printed placed in Envelop to mail to the patient or have her pick up.

## 2020-08-22 NOTE — Telephone Encounter (Signed)
Placed order in mail.  Patient aware she state she will go to [facility] and she wanted me to mail the order to her home.

## 2021-03-15 ENCOUNTER — Emergency Department (HOSPITAL_COMMUNITY): Payer: 59

## 2021-03-15 ENCOUNTER — Encounter (HOSPITAL_COMMUNITY): Payer: Self-pay | Admitting: *Deleted

## 2021-03-15 ENCOUNTER — Emergency Department (HOSPITAL_COMMUNITY)
Admission: EM | Admit: 2021-03-15 | Discharge: 2021-03-15 | Disposition: A | Discharge status: Home / Self Care | Payer: 59 | Attending: Emergency Medicine | Admitting: Emergency Medicine

## 2021-03-15 ENCOUNTER — Other Ambulatory Visit: Payer: Self-pay

## 2021-03-15 DIAGNOSIS — R079 Chest pain, unspecified: Secondary | ICD-10-CM | POA: Insufficient documentation

## 2021-03-15 DIAGNOSIS — M546 Pain in thoracic spine: Secondary | ICD-10-CM | POA: Insufficient documentation

## 2021-03-15 DIAGNOSIS — R0602 Shortness of breath: Secondary | ICD-10-CM | POA: Insufficient documentation

## 2021-03-15 LAB — BASIC METABOLIC PANEL
Anion gap: 8 (ref 5–15)
BUN: 7 mg/dL (ref 6–20)
CO2: 28 mmol/L (ref 22–32)
Calcium: 9.3 mg/dL (ref 8.9–10.3)
Chloride: 103 mmol/L (ref 98–111)
Creatinine, Ser: 0.64 mg/dL (ref 0.44–1.00)
GFR, Estimated: >60 mL/min (ref >60)
Glucose, Bld: 107 mg/dL — ABNORMAL HIGH (ref 70–99)
Potassium: 3.4 mmol/L — ABNORMAL LOW (ref 3.5–5.1)
Sodium: 139 mmol/L (ref 135–145)

## 2021-03-15 LAB — CBC WITH DIFFERENTIAL/PLATELET
Abs Immature Granulocytes: 0.07 10*3/uL (ref 0.00–0.07)
Basophils Absolute: 0.1 10*3/uL (ref 0.0–0.1)
Basophils Relative: 1 %
Eosinophils Absolute: 0.2 10*3/uL (ref 0.0–0.5)
Eosinophils Relative: 2 %
HCT: 41.2 % (ref 36.0–46.0)
Hemoglobin: 13.7 g/dL (ref 12.0–15.0)
Immature Granulocytes: 1 %
Lymphocytes Relative: 18 %
Lymphs Abs: 2.2 10*3/uL (ref 0.7–4.0)
MCH: 29.3 pg (ref 26.0–34.0)
MCHC: 33.3 g/dL (ref 30.0–36.0)
MCV: 88.2 fL (ref 80.0–100.0)
Monocytes Absolute: 1 10*3/uL (ref 0.1–1.0)
Monocytes Relative: 8 %
Neutro Abs: 8.7 10*3/uL — ABNORMAL HIGH (ref 1.7–7.7)
Neutrophils Relative %: 70 %
Platelets: 418 10*3/uL — ABNORMAL HIGH (ref 150–400)
RBC: 4.67 MIL/uL (ref 3.87–5.11)
RDW: 14 % (ref 11.5–15.5)
WBC: 12.3 10*3/uL — ABNORMAL HIGH (ref 4.0–10.5)
nRBC: 0 % (ref 0.0–0.2)

## 2021-03-15 LAB — TROPONIN I (HIGH SENSITIVITY)
Troponin I (High Sensitivity): 4 ng/L (ref <18)
Troponin I (High Sensitivity): 5 ng/L (ref <18)

## 2021-03-15 LAB — I-STAT BETA HCG BLOOD, ED (MC, WL, AP ONLY): I-stat hCG, quantitative: <5 m[IU]/mL (ref <5)

## 2021-03-15 NOTE — ED Triage Notes (Signed)
The pt is c/o upper back pain through to her chest since yesterday  she has ahistory of the same and has been worked up for the same  lmp 2 weeks ago

## 2021-03-15 NOTE — ED Provider Notes (Signed)
[facility] EMERGENCY DEPARTMENT Provider Note   CSN: [id number] Arrival date & time: 03/15/21  1520     History Chief Complaint  Patient presents with   Chest Pain    Virginia Barrett is a 45 y.o. female.  Phill Myron [place] with right-sided thoracic pain which does radiate to her chest.  She states that a coworker who has a history as a paramedic encouraged her to come for a cardiac evaluation.  She has had this pain in the past, and she was evaluated by a cardiologist with [facility].  She had a stress test and a coronary artery CT scan.  The history is provided by the patient.  Back Pain Location:  Thoracic spine (right scapula) Quality:  Burning Radiates to: left scapula and through to chest. Pain severity:  Moderate Pain is:  Same all the time Onset quality:  Gradual Duration:  1 day Timing:  Constant Progression:  Unchanged Chronicity:  Recurrent Context comment:  No known cause Relieved by:  Muscle relaxants Exacerbated by: movement of shoulders. Ineffective treatments:  None tried Associated symptoms: chest pain   Associated symptoms: no abdominal pain, no dysuria, no fever, no numbness, no tingling and no weakness       Past Medical History:  Diagnosis Date   Acute frontal sinusitis, unspecified    Bronchitis, not specified as acute or chronic    Constipation    Diarrhea    Disappearance and death of family member    Diverticulitis    Diverticulitis of small intestine without perforation or abscess without bleeding    Dizziness and giddiness    Headache(784.0)    occasional    Insomnia, unspecified    Lumbago    Obesity, unspecified    Pain in right knee    Pneumonia    hx of walking pneumonia    Precancerous skin lesion    Sinus infection    diagnosed on 01/28/13     Patient Active Problem List   Diagnosis Date Noted   History of diverticulitis 07/10/2020   History of colonic polyps 07/10/2020   Diverticulitis of small intestine without  perforation or abscess without bleeding    Insomnia, unspecified    Acute frontal sinusitis, unspecified    Obesity, unspecified    Pain in right knee    Diverticulitis     Past Surgical History:  Procedure Laterality Date   BIOPSY  08/01/2020   Procedure: BIOPSY;  Surgeon: [name], MD;  [facility];  Service: Endoscopy;;  rectal polyps times 2   COLONOSCOPY N/A 11/17/2012   Procedure: COLONOSCOPY;  Surgeon: [name], MD;  [facility];  Service: Endoscopy;  Laterality: N/A;  1200-moved to1030 Ann to notify pt   COLONOSCOPY WITH PROPOFOL N/A 08/01/2020   Procedure: COLONOSCOPY WITH PROPOFOL;  Surgeon: [name], MD;  [facility];  Service: Endoscopy;  Laterality: N/A;  8:30   LAPAROSCOPIC SIGMOID COLECTOMY N/A 02/03/2013   Procedure: LAPAROSCOPIC SIGMOID COLECTOMY;  Surgeon: [name], MD;  Location: [facility];  Service: General;  Laterality: N/A;   MOUTH SURGERY     wisdom teeth removed   PROCTOSCOPY  02/03/2013   Procedure: PROCTOSCOPY;  Surgeon: [name], MD;  Location: [facility];  Service: General;;     OB History   No obstetric history on file.     Family History  Problem Relation Age of Onset   Colon polyps Mother  Diabetes Mother    COPD Mother    Colon polyps Other    Hodgkin's lymphoma Father    Cancer Maternal Aunt        breast   Cancer Maternal Grandmother        breast   Colon cancer Neg Hx     Social History   Tobacco Use   Smoking status: Never   Smokeless tobacco: Never  Vaping Use   Vaping Use: Never used  Substance Use Topics   Alcohol use: Yes    Comment: rarely   Drug use: No    Home Medications Prior to Admission medications   Medication Sig Start Date End Date Taking? Authorizing Provider  acetaminophen (TYLENOL) 500 MG tablet Take 1 tablet (500 mg total) by mouth every 6 (six) hours as needed. 12/31/19   Avegno, Darrelyn Hillock, FNP  EPINEPHrine 0.3 mg/0.3 mL IJ SOAJ injection Inject  0.3 mg into the muscle as needed for anaphylaxis. 06/18/20   Wurst, [place], PA-C  escitalopram (LEXAPRO) 10 MG tablet Take 10 mg by mouth daily.    [provider]  famotidine (PEPCID) 20 MG tablet Take 1 tablet (20 mg total) by mouth 2 (two) times daily. Patient not taking: Reported on 07/30/2020 06/18/20   Wurst, [place], PA-C  hyoscyamine (LEVBID) 0.375 MG 12 hr tablet Take 1 tablet (0.375 mg total) by mouth 2 (two) times daily as needed. Patient not taking: Reported on 07/30/2020 07/05/19   [name], MD  ondansetron (ZOFRAN) 4 MG tablet Take 1 tablet (4 mg total) by mouth every 8 (eight) hours as needed for nausea or vomiting. Patient not taking: Reported on 07/30/2020 05/03/20   Marcello Fennel, PA-C  diphenhydrAMINE (BENADRYL) 25 MG tablet Take 1 tablet (25 mg total) by mouth every 6 (six) hours. 11/14/18 09/27/19  Fredia Sorrow, MD    Allergies    Cinnamon, Elemental sulfur, Codeine, Penicillins, Red dye, and Ciprofloxacin  Review of Systems   Review of Systems  Constitutional:  Negative for chills and fever.  HENT:  Negative for ear pain and sore throat.   Eyes:  Negative for pain and visual disturbance.  Respiratory:  Negative for cough and shortness of breath.   Cardiovascular:  Positive for chest pain. Negative for palpitations.  Gastrointestinal:  Negative for abdominal pain and vomiting.  Genitourinary:  Negative for dysuria and hematuria.  Musculoskeletal:  Positive for back pain. Negative for arthralgias.  Skin:  Negative for color change and rash.  Neurological:  Negative for tingling, seizures, syncope, weakness and numbness.  All other systems reviewed and are negative.  Physical Exam Updated Vital Signs BP 130/74   Pulse 82   Temp 98.3 F (36.8 C) (Oral)   Resp 16   Ht 5' 6.5" (1.689 m)   Wt 133.8 kg   LMP 03/01/2021   SpO2 99%   BMI 46.90 kg/m   Physical Exam Vitals and nursing note reviewed.  Constitutional:      Appearance: She is  well-developed.  HENT:     Head: Normocephalic and atraumatic.  Cardiovascular:     Rate and Rhythm: Normal rate and regular rhythm.     Heart sounds: Normal heart sounds.  Pulmonary:     Effort: Pulmonary effort is normal. No tachypnea.     Breath sounds: Normal breath sounds.  Musculoskeletal:     Right lower leg: No edema.     Left lower leg: No edema.     Comments: Back is normal to inspection.  Skin:    General: Skin is warm and dry.  Neurological:     General: No focal deficit present.     Mental Status: She is alert and oriented to person, place, and time.  Psychiatric:        Mood and Affect: Mood normal.        Behavior: Behavior normal.    ED Results / Procedures / Treatments   Labs (all labs ordered are listed, but only abnormal results are displayed) Labs Reviewed  BASIC METABOLIC PANEL - Abnormal; Notable for the following components:      Result Value   Potassium 3.4 (*)    Glucose, Bld 107 (*)    All other components within normal limits  CBC WITH DIFFERENTIAL/PLATELET - Abnormal; Notable for the following components:   WBC 12.3 (*)    Platelets 418 (*)    Neutro Abs 8.7 (*)    All other components within normal limits  I-STAT BETA HCG BLOOD, ED ([facility], [facility], [facility] ONLY)  TROPONIN I (HIGH SENSITIVITY)  TROPONIN I (HIGH SENSITIVITY)    EKG EKG Interpretation  Date/Time:  Friday March 15 2021 16:00:13 EDT Ventricular Rate:  95 PR Interval:  138 QRS Duration: 82 QT Interval:  364 QTC Calculation: 457 R Axis:   -11 Text Interpretation: Normal sinus rhythm Possible Inferior infarct , age undetermined Cannot rule out Anterior infarct , age undetermined Abnormal ECG similar to prior Confirmed by Lorre Munroe ([id number]) on 03/15/2021 7:28:03 PM  Radiology DG Chest 2 View  Result Date: 03/15/2021 CLINICAL DATA:  Chest pain short of breath EXAM: CHEST - 2 VIEW COMPARISON:  02/01/2013 FINDINGS: The heart size and mediastinal contours are within normal limits. Both  lungs are clear. The visualized skeletal structures are unremarkable. IMPRESSION: No active cardiopulmonary disease. Electronically Signed   By: Donavan Foil M.D.   On: 03/15/2021 16:56    Procedures Procedures   Medications Ordered in ED Medications - No data to display  ED Course  I have reviewed the triage vital signs and the nursing notes.  Pertinent labs & imaging results that were available during my care of the patient were reviewed by me and considered in my medical decision making (see chart for details).    MDM Rules/Calculators/A&P                           IFE CRAPSER presents with back pain that radiates to her chest.  She was evaluated for evidence of ACS.  PERC negative, and no concern for PE.  I entertain the thought of aortic dissection, but her pain is similar to pain she has experienced in the past.  It is worse with certain movements of her torso and upper extremities.  ED work-up was within normal limits, and I think she would benefit from orthopedic evaluation.  GI pathology as an alternative, and she can consider gastroenterology follow-up as needed. Final Clinical Impression(s) / ED Diagnoses Final diagnoses:  Chest pain, unspecified type    Rx / DC Orders ED Discharge Orders     None        Arnaldo Natal, MD 03/15/21 2012

## 2021-03-15 NOTE — ED Provider Notes (Signed)
Emergency Medicine Provider Triage Evaluation Note  DREAMER DULONG , a 45 y.o. female  was evaluated in triage.  Pt complains of patient is a 45 year old female with chief complaint of chest pain.  She states that she feels that her chest pain is starting in her radiating to the front.  Denies any nausea vomiting or diaphoresis.  States that she feels somewhat short of breath with exertion over the past few weeks Is a dizziness.  No hemoptysis. No history of VTE.  Review of Systems  Positive: Chest pain Negative: Nausea  Physical Exam  BP (!) 158/110 (BP Location: Right Arm)   Pulse (!) 108   Temp 98.5 F (36.9 C)   Resp (!) 22   Ht 5' 6.5" (1.689 m)   Wt 133.8 kg   LMP 03/01/2021   SpO2 98%   BMI 46.90 kg/m  Gen:   Awake, no distress   Resp:  Normal effort  MSK:   Moves extremities without difficulty  Other:    Medical Decision Making  Medically screening exam initiated at 4:14 PM.  Appropriate orders placed.  ARIZBETH PARTHASARATHY was informed that the remainder of the evaluation will be completed by another provider, this initial triage assessment does not replace that evaluation, and the importance of remaining in the ED until their evaluation is complete.  Well-appearing 45 year old female appears quite anxious.  In no acute distress.   Tedd Sias, [place] 03/15/21 1620    Luna Fuse, MD 03/19/21 [phone]

## 2021-04-08 DIAGNOSIS — R0789 Other chest pain: Secondary | ICD-10-CM | POA: Diagnosis not present

## 2021-04-08 DIAGNOSIS — R079 Chest pain, unspecified: Secondary | ICD-10-CM | POA: Diagnosis not present

## 2021-04-08 DIAGNOSIS — R002 Palpitations: Secondary | ICD-10-CM | POA: Diagnosis not present

## 2021-04-17 ENCOUNTER — Ambulatory Visit: Payer: 59 | Admitting: Orthopaedic Surgery

## 2021-04-22 ENCOUNTER — Ambulatory Visit: Payer: Self-pay

## 2021-04-22 ENCOUNTER — Ambulatory Visit: Payer: 59 | Admitting: Orthopaedic Surgery

## 2021-04-22 ENCOUNTER — Encounter: Payer: Self-pay | Admitting: Orthopaedic Surgery

## 2021-04-22 ENCOUNTER — Other Ambulatory Visit: Payer: Self-pay

## 2021-04-22 DIAGNOSIS — M898X1 Other specified disorders of bone, shoulder: Secondary | ICD-10-CM | POA: Diagnosis not present

## 2021-04-22 MED ORDER — PREDNISONE 50 MG PO TABS: ORAL_TABLET | ORAL | 0 refills | Status: DC

## 2021-04-22 MED ORDER — METHOCARBAMOL 750 MG PO TABS: 750.0000 mg | ORAL_TABLET | Freq: Three times a day (TID) | ORAL | 1 refills | Status: DC | PRN

## 2021-04-22 NOTE — Progress Notes (Signed)
Office Visit Note   Patient: Virginia Barrett           Date of Birth: [birth year]           MRN: [id number] Visit Date: 04/22/2021              Requested by: No referring provider defined for this encounter. PCP: Patient, No Pcp Per (Inactive)   Assessment & Plan: Visit Diagnoses:  1. Shoulder blade pain     Plan: I went over her x-rays of her cervical spine with her.  She would benefit from Robaxin 750 mg up to 3 times a day because that will be less sedating.  I would also like to put her on prednisone 50 mg daily for the next 5 days and send her to outpatient physical therapy up in [place] for any modalities that can help with her posture and to decrease the pain that she is experiencing.  All questions and concerns were answered and addressed.  We will reevaluate her in 4 weeks.  Follow-Up Instructions: Return in about 4 weeks (around 05/20/2021).   Orders:  Orders Placed This Encounter  Procedures   XR Cervical Spine 2 or 3 views   Meds ordered this encounter  Medications   methocarbamol (ROBAXIN) 750 MG tablet    Sig: Take 1 tablet (750 mg total) by mouth every 8 (eight) hours as needed for muscle spasms.    Dispense:  60 tablet    Refill:  1   predniSONE (DELTASONE) 50 MG tablet    Sig: Take one tablet daily for 5 days.    Dispense:  5 tablet    Refill:  0      Procedures: No procedures performed   Clinical Data: No additional findings.   Subjective: Chief Complaint  Patient presents with   Other     Pain between shoulder blades  The patient is a very pleasant 45 year old female who comes in with a 3-week history of pain between her shoulder blades.  She has had a little bit of neck pain as well.  It radiates into her arms on occasion.  She has been on some Flexeril but that does make her too sleepy so she cannot take it during the day.  She denies any specific injury.  She has been trying heat.  It is not worse with activities.  She denies any numbness  and tingling in her hands or issues with her shoulders.  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 HPI  Review of Systems Today there is listed no headache, chest pain, shortness of breath, fever, chills, nausea, vomiting  Objective: Vital Signs: There were no vitals taken for this visit.  Physical Exam She is alert and orient x3 and in no acute distress Ortho Exam Examination of her bilateral upper extremity shows normal muscle tone in all muscle groups and excellent strength.  She has no numbness and tingling.  Range of motion of her neck is entirely full and normal.  She has some midline cervical and thoracic pain to palpation. Specialty Comments:  No specialty comments available.  Imaging: XR Cervical Spine 2 or 3 views  Result Date: 04/22/2021 2 views of the cervical spine show complete loss of cervical lordosis and a slight kyphosis at the more proximal aspect of the cervical spine.  The disc spaces are well-maintained.  There is some small  osteophytes anteriorly at the mid cervical spine.    PMFS History: Patient Active Problem List   Diagnosis Date Noted   History of diverticulitis 07/10/2020   History of colonic polyps 07/10/2020   Diverticulitis of small intestine without perforation or abscess without bleeding    Insomnia, unspecified    Acute frontal sinusitis, unspecified    Obesity, unspecified    Pain in right knee    Diverticulitis    Past Medical History:  Diagnosis Date   Acute frontal sinusitis, unspecified    Bronchitis, not specified as acute or chronic    Constipation    Diarrhea    Disappearance and death of family member    Diverticulitis    Diverticulitis of small intestine without perforation or abscess without bleeding    Dizziness and giddiness    Headache(784.0)    occasional    Insomnia, unspecified    Lumbago    Obesity, unspecified    Pain  in right knee    Pneumonia    hx of walking pneumonia    Precancerous skin lesion    Sinus infection    diagnosed on 01/28/13     Family History  Problem Relation Age of Onset   Colon polyps Mother    Diabetes Mother    COPD Mother    Colon polyps Other    Hodgkin's lymphoma Father    Cancer Maternal Aunt        breast   Cancer Maternal Grandmother        breast   Colon cancer Neg Hx     Past Surgical History:  Procedure Laterality Date   BIOPSY  08/01/2020   Procedure: BIOPSY;  Surgeon: [name], MD;  [facility];  Service: Endoscopy;;  rectal polyps times 2   COLONOSCOPY N/A 11/17/2012   Procedure: COLONOSCOPY;  Surgeon: [name], MD;  [facility];  Service: Endoscopy;  Laterality: N/A;  1200-moved to1030 Ann to notify pt   COLONOSCOPY WITH PROPOFOL N/A 08/01/2020   Procedure: COLONOSCOPY WITH PROPOFOL;  Surgeon: [name], MD;  [facility];  Service: Endoscopy;  Laterality: N/A;  8:30   LAPAROSCOPIC SIGMOID COLECTOMY N/A 02/03/2013   Procedure: LAPAROSCOPIC SIGMOID COLECTOMY;  Surgeon: [name], MD;  [facility];  Service: General;  Laterality: N/A;   MOUTH SURGERY     wisdom teeth removed   PROCTOSCOPY  02/03/2013   Procedure: PROCTOSCOPY;  Surgeon: [name], MD;  Location[facility];  Service: General;;   Social History   Occupational History   Not on file  Tobacco Use   Smoking status: Never   Smokeless tobacco: Never  Vaping Use   Vaping Use: Never used  Substance and Sexual Activity   Alcohol use: Yes    Comment: rarely   Drug use: No   Sexual activity: Never    Birth control/protection: None

## 2021-04-23 NOTE — Addendum Note (Signed)
Addended by: Robyne Peers on: 04/23/2021 10:56 AM   Modules accepted: Orders

## 2021-06-25 ENCOUNTER — Telehealth (INDEPENDENT_AMBULATORY_CARE_PROVIDER_SITE_OTHER): Payer: Self-pay | Admitting: *Deleted

## 2021-06-25 NOTE — Telephone Encounter (Signed)
Pt wanted to make appt for her yearly check up. Please call and schedule pt. [phone]

## 2021-07-26 ENCOUNTER — Encounter: Payer: Self-pay | Admitting: Emergency Medicine

## 2021-07-26 ENCOUNTER — Ambulatory Visit
Admission: EM | Admit: 2021-07-26 | Discharge: 2021-07-26 | Disposition: A | Discharge status: Home / Self Care | Payer: 59 | Attending: Family Medicine | Admitting: Family Medicine

## 2021-07-26 ENCOUNTER — Other Ambulatory Visit: Payer: Self-pay

## 2021-07-26 DIAGNOSIS — J069 Acute upper respiratory infection, unspecified: Secondary | ICD-10-CM | POA: Diagnosis not present

## 2021-07-26 DIAGNOSIS — B001 Herpesviral vesicular dermatitis: Secondary | ICD-10-CM

## 2021-07-26 DIAGNOSIS — Z9189 Other specified personal risk factors, not elsewhere classified: Secondary | ICD-10-CM

## 2021-07-26 MED ORDER — PREDNISONE 20 MG PO TABS: 40.0000 mg | ORAL_TABLET | Freq: Every day | ORAL | 0 refills | Status: DC

## 2021-07-26 MED ORDER — PROMETHAZINE-DM 6.25-15 MG/5ML PO SYRP: 5.0000 mL | ORAL_SOLUTION | Freq: Four times a day (QID) | ORAL | 0 refills | Status: DC | PRN

## 2021-07-26 MED ORDER — OSELTAMIVIR PHOSPHATE 6 MG/ML PO SUSR: 75.0000 mg | Freq: Two times a day (BID) | ORAL | 0 refills | Status: AC

## 2021-07-26 NOTE — ED Triage Notes (Signed)
Cough, body aches, chills fever, vomiting, diarrhea, symptoms started on  Tuesday.  Has tried over the counter medications without relief.  Also wants a refill for valtrex.

## 2021-07-28 ENCOUNTER — Telehealth: Payer: Self-pay | Admitting: Family Medicine

## 2021-07-28 LAB — COVID-19, FLU A+B NAA
Influenza A, NAA: NOT DETECTED
Influenza B, NAA: NOT DETECTED
SARS-CoV-2, NAA: NOT DETECTED

## 2021-07-28 MED ORDER — VALACYCLOVIR HCL 1 G PO TABS
1000.0000 mg | ORAL_TABLET | Freq: Two times a day (BID) | ORAL | 2 refills | Status: DC
Start: 2021-07-28 — End: 2021-08-15

## 2021-07-28 NOTE — ED Provider Notes (Signed)
[facility]    CSN: [id number] Arrival date & time: 07/26/21  1723      History   Chief Complaint No chief complaint on file.   HPI Virginia Barrett is a 46 y.o. female.   Patient presenting today with several day history of cough, body aches, chills, fever, vomiting, diarrhea.  She denies chest pain, shortness of breath, abdominal pain, dizziness, sore throat.  Taking over-the-counter cold and congestion medications with minimal relief.  She states multiple family members also with similar symptoms and being treated for flu.  She also is requesting a refill on her Valtrex as she feels a cold sore coming on which often happens when she gets sick.   Past Medical History:  Diagnosis Date   Acute frontal sinusitis, unspecified    Bronchitis, not specified as acute or chronic    Constipation    Diarrhea    Disappearance and death of family member    Diverticulitis    Diverticulitis of small intestine without perforation or abscess without bleeding    Dizziness and giddiness    Headache(784.0)    occasional    Insomnia, unspecified    Lumbago    Obesity, unspecified    Pain in right knee    Pneumonia    hx of walking pneumonia    Precancerous skin lesion    Sinus infection    diagnosed on 01/28/13    Patient Active Problem List   Diagnosis Date Noted   History of diverticulitis 07/10/2020   History of colonic polyps 07/10/2020   Diverticulitis of small intestine without perforation or abscess without bleeding    Insomnia, unspecified    Acute frontal sinusitis, unspecified    Obesity, unspecified    Pain in right knee    Diverticulitis     Past Surgical History:  Procedure Laterality Date   BIOPSY  08/01/2020   Procedure: BIOPSY;  Surgeon: [name], MD;  Location: [facility];  Service: Endoscopy;;  rectal polyps times 2   COLONOSCOPY N/A 11/17/2012   Procedure: COLONOSCOPY;  Surgeon: [name], MD;  Location: [facility];  Service:  Endoscopy;  Laterality: N/A;  1200-moved to1030 Ann to notify pt   COLONOSCOPY WITH PROPOFOL N/A 08/01/2020   Procedure: COLONOSCOPY WITH PROPOFOL;  Surgeon: [name], MD;  Location: [facility];  Service: Endoscopy;  Laterality: N/A;  8:30   LAPAROSCOPIC SIGMOID COLECTOMY N/A 02/03/2013   Procedure: LAPAROSCOPIC SIGMOID COLECTOMY;  Surgeon: [name], MD;  Location: [facility];  Service: General;  Laterality: N/A;   MOUTH SURGERY     wisdom teeth removed   PROCTOSCOPY  02/03/2013   Procedure: PROCTOSCOPY;  Surgeon: [name], MD;  Location: [facility];  Service: General;;   OB History   No obstetric history on file.     Home Medications    Prior to Admission medications   Medication Sig Start Date End Date Taking? Authorizing Provider  oseltamivir (TAMIFLU) 6 MG/ML SUSR suspension Take 12.5 mLs (75 mg total) by mouth 2 (two) times daily for 5 days. 07/26/21 07/31/21 Yes Volney American, PA-C  predniSONE (DELTASONE) 20 MG tablet Take 2 tablets (40 mg total) by mouth daily with breakfast. 07/26/21  Yes Volney American, PA-C  promethazine-dextromethorphan (PROMETHAZINE-DM) 6.25-15 MG/5ML syrup Take 5 mLs by mouth 4 (four) times daily as needed. 07/26/21  Yes Volney American, PA-C  acetaminophen (TYLENOL) 500 MG tablet Take 1 tablet (500 mg total) by mouth  every 6 (six) hours as needed. Patient not taking: No sig reported 12/31/19   Emerson Monte, FNP  acetaminophen (TYLENOL) 500 MG tablet Take 500 mg by mouth every 6 (six) hours as needed for mild pain or moderate pain.    [provider]  EPINEPHrine 0.3 mg/0.3 mL IJ SOAJ injection Inject 0.3 mg into the muscle as needed for anaphylaxis. 06/18/20   Wurst, [place], PA-C  famotidine (PEPCID) 20 MG tablet Take 1 tablet (20 mg total) by mouth 2 (two) times daily. Patient not taking: No sig reported 06/18/20   Wurst, [place], PA-C  hyoscyamine (LEVBID) 0.375 MG 12 hr tablet Take 1 tablet (0.375 mg total)  by mouth 2 (two) times daily as needed. Patient not taking: No sig reported 07/05/19   [name], MD  methocarbamol (ROBAXIN) 750 MG tablet Take 1 tablet (750 mg total) by mouth every 8 (eight) hours as needed for muscle spasms. 04/22/21   Mcarthur Rossetti, MD  ondansetron (ZOFRAN) 4 MG tablet Take 1 tablet (4 mg total) by mouth every 8 (eight) hours as needed for nausea or vomiting. Patient not taking: No sig reported 05/03/20   Marcello Fennel, PA-C  predniSONE (DELTASONE) 50 MG tablet Take one tablet daily for 5 days. 04/22/21   Mcarthur Rossetti, MD  valACYclovir (VALTREX) 1000 MG tablet Take 1 tablet (1,000 mg total) by mouth 2 (two) times daily. 07/28/21   Volney American, PA-C  diphenhydrAMINE (BENADRYL) 25 MG tablet Take 1 tablet (25 mg total) by mouth every 6 (six) hours. 11/14/18 09/27/19  Fredia Sorrow, MD   Family History Family History  Problem Relation Age of Onset   Colon polyps Mother    Diabetes Mother    COPD Mother    Colon polyps Other    Hodgkin's lymphoma Father    Cancer Maternal Aunt        breast   Cancer Maternal Grandmother        breast   Colon cancer Neg Hx     Social History Social History   Tobacco Use   Smoking status: Never   Smokeless tobacco: Never  Vaping Use   Vaping Use: Never used  Substance Use Topics   Alcohol use: Yes    Comment: rarely   Drug use: No   Allergies   Cinnamon, Elemental sulfur, Codeine, Penicillins, Red dye, and Ciprofloxacin  Review of Systems Review of Systems PER HPI  Physical Exam Triage Vital Signs ED Triage Vitals  Enc Vitals Group     BP 07/26/21 1944 (!) 169/88     Pulse Rate 07/26/21 1944 (!) 115     Resp 07/26/21 1944 18     Temp 07/26/21 1944 98.7 F (37.1 C)     Temp Source 07/26/21 1944 Temporal     SpO2 07/26/21 1944 95 %     Weight --      Height --      Head Circumference --      Peak Flow --      Pain Score 07/26/21 1945 2     Pain Loc --      Pain Edu? --       Excl. in [place]? --    No data found.  Updated Vital Signs BP (!) 169/88 (BP Location: Right Arm)    Pulse (!) 115    Temp 98.7 F (37.1 C) (Temporal)    Resp 18    LMP 07/22/2021 (Exact Date)    SpO2  95%   Visual Acuity Right Eye Distance:   Left Eye Distance:   Bilateral Distance:    Right Eye Near:   Left Eye Near:    Bilateral Near:     Physical Exam Vitals and nursing note reviewed.  Constitutional:      Appearance: Normal appearance.  HENT:     Head: Atraumatic.     Right Ear: Tympanic membrane and external ear normal.     Left Ear: Tympanic membrane and external ear normal.     Nose: Rhinorrhea present.     Mouth/Throat:     Mouth: Mucous membranes are moist.     Pharynx: Posterior oropharyngeal erythema present.  Eyes:     Extraocular Movements: Extraocular movements intact.     Conjunctiva/sclera: Conjunctivae normal.  Cardiovascular:     Rate and Rhythm: Normal rate and regular rhythm.     Heart sounds: Normal heart sounds.  Pulmonary:     Effort: Pulmonary effort is normal.     Breath sounds: Normal breath sounds. No wheezing or rales.  Musculoskeletal:        General: Normal range of motion.     Cervical back: Normal range of motion and neck supple.  Skin:    General: Skin is warm and dry.  Neurological:     Mental Status: She is alert and oriented to person, place, and time.  Psychiatric:        Mood and Affect: Mood normal.        Thought Content: Thought content normal.     UC Treatments / Results  Labs (all labs ordered are listed, but only abnormal results are displayed) Labs Reviewed  COVID-19, FLU A+B NAA    EKG   Radiology No results found.  Procedures Procedures (including critical care time)  Medications Ordered in UC Medications - No data to display  Initial Impression / Assessment and Plan / UC Course  I have reviewed the triage vital signs and the nursing notes.  Pertinent labs & imaging results that were available  during my care of the patient were reviewed by me and considered in my medical decision making (see chart for details).     Mildly tachycardic in triage, otherwise vital signs reassuring today.  Suspect flulike illness, COVID and flu testing pending, treat with Tamiflu proactively while awaiting results.  We will also send prednisone, Phenergan DM and Valtrex for symptomatic benefit.  Discussed return precautions for any worsening symptoms.  Final Clinical Impressions(s) / UC Diagnoses   Final diagnoses:  At increased risk of exposure to COVID-19 virus  Viral URI with cough  Cold sore   Discharge Instructions   None    ED Prescriptions     Medication Sig Dispense Auth. Provider   predniSONE (DELTASONE) 20 MG tablet Take 2 tablets (40 mg total) by mouth daily with breakfast. 10 tablet Volney American, PA-C   promethazine-dextromethorphan (PROMETHAZINE-DM) 6.25-15 MG/5ML syrup Take 5 mLs by mouth 4 (four) times daily as needed. 100 mL Volney American, [place]   oseltamivir (TAMIFLU) 6 MG/ML SUSR suspension Take 12.5 mLs (75 mg total) by mouth 2 (two) times daily for 5 days. 125 mL Volney American, [place]      PDMP not reviewed this encounter.   Volney American, [place] 07/28/21 [phone]

## 2021-07-28 NOTE — Telephone Encounter (Signed)
Valtrex sent, was to be sent during visit

## 2021-08-15 ENCOUNTER — Ambulatory Visit
Admission: EM | Admit: 2021-08-15 | Discharge: 2021-08-15 | Discharge status: Absent without leave | Payer: 59 | Attending: Family Medicine | Admitting: Family Medicine

## 2021-08-15 ENCOUNTER — Encounter: Payer: Self-pay | Admitting: Emergency Medicine

## 2021-08-15 ENCOUNTER — Other Ambulatory Visit: Payer: Self-pay

## 2021-08-15 DIAGNOSIS — B001 Herpesviral vesicular dermatitis: Secondary | ICD-10-CM

## 2021-08-15 MED ORDER — VALACYCLOVIR HCL 1 G PO TABS: 1000.0000 mg | ORAL_TABLET | Freq: Two times a day (BID) | ORAL | 5 refills | Status: DC

## 2021-08-15 NOTE — ED Notes (Signed)
Medication had been sent for this patient on the date of her visit.  The pharmacy never received the Rx.  Patient is being dismissed as this visit was not needed.  Medication resent to pharmacy

## 2021-08-15 NOTE — ED Provider Notes (Signed)
Patient had called the clinic yesterday because her Valtrex that was sent after prior visit was not received by the pharmacy per [facility].  She had requested that the medication be resent yesterday and was told that she needed to be seen.  Patient does not need an appointment today, I have re-sent the medication as the only issue was that the pharmacy did not receive the electronic prescription.  Apologized to patient, co-pay refunded and will no charge this visit.   Volney American, [place] 08/15/21 1417

## 2021-08-15 NOTE — ED Triage Notes (Signed)
Needs refill for Valtrex.

## 2022-05-06 ENCOUNTER — Other Ambulatory Visit: Payer: Self-pay | Admitting: Orthopaedic Surgery

## 2022-09-25 ENCOUNTER — Encounter: Payer: Self-pay | Admitting: Radiology

## 2022-10-07 ENCOUNTER — Encounter: Payer: Self-pay | Admitting: Emergency Medicine

## 2022-10-07 ENCOUNTER — Other Ambulatory Visit: Payer: Self-pay

## 2022-10-07 ENCOUNTER — Ambulatory Visit
Admission: EM | Admit: 2022-10-07 | Discharge: 2022-10-07 | Disposition: A | Discharge status: Home / Self Care | Payer: 59

## 2022-10-07 DIAGNOSIS — H00024 Hordeolum internum left upper eyelid: Secondary | ICD-10-CM | POA: Diagnosis not present

## 2022-10-07 MED ORDER — GENTAMICIN SULFATE 0.3 % OP SOLN: 1.0000 [drp] | Freq: Three times a day (TID) | OPHTHALMIC | 0 refills | Status: DC

## 2022-10-07 MED ORDER — POLYMYXIN B-TRIMETHOPRIM 10000-0.1 UNIT/ML-% OP SOLN: 1.0000 [drp] | Freq: Three times a day (TID) | OPHTHALMIC | 0 refills | Status: DC

## 2022-10-07 NOTE — ED Triage Notes (Signed)
Pt reports left eye swelling/tenderness a few days ago. Pt reports usually wears contacts but reports has not worn them in 2 days and has tried ice compresses with no change in symptoms.   Denies any known injury.

## 2022-10-07 NOTE — ED Provider Notes (Signed)
[facility]    CSN: [id number] Arrival date & time: 10/07/22  1552      History   Chief Complaint Chief Complaint  Patient presents with   Eye Problem    Swollen eyelid - Entered by patient    HPI Virginia Barrett is a 47 y.o. female who presents with L eye lid swelling and tenderness x 2 days. Has had sty and lid ulcers, and feels like that. Denies discharge, photophobia or red eye. Her L upper lid was more swollen this am. She has a tender area on the L upper lateral lid when she presses on it. She no longer has a PCP to try to see them.     Normally wears contacts, but has not wore then x 2 days.     Past Medical History:  Diagnosis Date   Acute frontal sinusitis, unspecified    Bronchitis, not specified as acute or chronic    Constipation    Diarrhea    Disappearance and death of family member    Diverticulitis    Diverticulitis of small intestine without perforation or abscess without bleeding    Dizziness and giddiness    Headache(784.0)    occasional    Insomnia, unspecified    Lumbago    Obesity, unspecified    Pain in right knee    Pneumonia    hx of walking pneumonia    Precancerous skin lesion    Sinus infection    diagnosed on 01/28/13     Patient Active Problem List   Diagnosis Date Noted   History of diverticulitis 07/10/2020   History of colonic polyps 07/10/2020   Diverticulitis of small intestine without perforation or abscess without bleeding    Insomnia, unspecified    Acute frontal sinusitis, unspecified    Obesity, unspecified    Pain in right knee    Diverticulitis     Past Surgical History:  Procedure Laterality Date   BIOPSY  08/01/2020   Procedure: BIOPSY;  Surgeon: [name], MD;  [facility]: [facility];  Service: Endoscopy;;  rectal polyps times 2   COLONOSCOPY N/A 11/17/2012   Procedure: COLONOSCOPY;  Surgeon: [name], MD;  [facility]: [facility];  Service: Endoscopy;  Laterality: N/A;  1200-moved to1030  Ann to notify pt   COLONOSCOPY WITH PROPOFOL N/A 08/01/2020   Procedure: COLONOSCOPY WITH PROPOFOL;  Surgeon: [name], MD;  Location: [facility];  Service: Endoscopy;  Laterality: N/A;  8:30   LAPAROSCOPIC SIGMOID COLECTOMY N/A 02/03/2013   Procedure: LAPAROSCOPIC SIGMOID COLECTOMY;  Surgeon: [name], MD;  Location: [facility];  Service: General;  Laterality: N/A;   MOUTH SURGERY     wisdom teeth removed   PROCTOSCOPY  02/03/2013   Procedure: PROCTOSCOPY;  Surgeon: [name], MD;  Location: [facility];  Service: General;;    OB History   No obstetric history on file.      Home Medications    Prior to Admission medications   Medication Sig Start Date End Date Taking? Authorizing Provider  gentamicin (GARAMYCIN) 0.3 % ophthalmic solution Place 1 drop into the left eye 3 (three) times daily. For 7 days 10/07/22  Yes Rodriguez-Southworth, Sunday Spillers, PA-C  loratadine (CLARITIN) 10 MG tablet Take 10 mg by mouth daily.   Yes [provider]  EPINEPHrine 0.3 mg/0.3 mL IJ SOAJ injection Inject 0.3 mg into the muscle as needed for anaphylaxis. Patient taking differently: Inject 0.3 mg into the muscle  as needed for anaphylaxis. Needs refill 06/18/20   Wurst, [place], PA-C  famotidine (PEPCID) 20 MG tablet Take 1 tablet (20 mg total) by mouth 2 (two) times daily. Patient not taking: Reported on 07/30/2020 06/18/20   Wurst, [place], PA-C  hyoscyamine (LEVBID) 0.375 MG 12 hr tablet Take 1 tablet (0.375 mg total) by mouth 2 (two) times daily as needed. Patient taking differently: Take 0.375 mg by mouth 2 (two) times daily as needed. As needed 07/05/19   [name], MD  methocarbamol (ROBAXIN) 750 MG tablet TAKE 1 TABLET(750 MG) BY MOUTH EVERY 8 HOURS AS NEEDED FOR MUSCLE SPASMS 05/07/22   Mcarthur Rossetti, MD  ondansetron (ZOFRAN) 4 MG tablet Take 1 tablet (4 mg total) by mouth every 8 (eight) hours as needed for nausea or vomiting. 05/03/20   Marcello Fennel, PA-C   valACYclovir (VALTREX) 1000 MG tablet Take 1 tablet (1,000 mg total) by mouth 2 (two) times daily. Patient taking differently: Take 1,000 mg by mouth 2 (two) times daily. As needed 08/15/21   Volney American, PA-C  diphenhydrAMINE (BENADRYL) 25 MG tablet Take 1 tablet (25 mg total) by mouth every 6 (six) hours. 11/14/18 09/27/19  Fredia Sorrow, MD    Family History Family History  Problem Relation Age of Onset   Colon polyps Mother    Diabetes Mother    COPD Mother    Colon polyps Other    Hodgkin's lymphoma Father    Cancer Maternal Aunt        breast   Cancer Maternal Grandmother        breast   Colon cancer Neg Hx     Social History Social History   Tobacco Use   Smoking status: Never   Smokeless tobacco: Never  Vaping Use   Vaping Use: Never used  Substance Use Topics   Alcohol use: Yes    Comment: rarely   Drug use: No     Allergies   Cinnamon, Elemental sulfur, Codeine, Penicillins, Red dye, and Ciprofloxacin   Review of Systems Review of Systems  Eyes:  Negative for photophobia, pain, discharge, redness and itching.       L upper lid swelling and tenderness     Physical Exam Triage Vital Signs ED Triage Vitals  Enc Vitals Group     BP 10/07/22 1606 106/89     Pulse Rate 10/07/22 1606 89     Resp 10/07/22 1606 20     Temp 10/07/22 1606 98.1 F (36.7 C)     Temp Source 10/07/22 1606 Oral     SpO2 10/07/22 1606 97 %     Weight --      Height --      Head Circumference --      Peak Flow --      Pain Score 10/07/22 1607 2     Pain Loc --      Pain Edu? --      Excl. in [place]? --    No data found.  Updated Vital Signs BP 106/89 (BP Location: Right Arm)   Pulse 89   Temp 98.1 F (36.7 C) (Oral)   Resp 20   LMP 09/28/2022 (Approximate)   SpO2 97%   Visual Acuity Right Eye Distance:   Left Eye Distance:   Bilateral Distance:    Right Eye Near:   Left Eye Near:    Bilateral Near:     Physical Exam Vitals and nursing note  reviewed.  Constitutional:  General: She is not in acute distress.    Appearance: She is obese. She is not toxic-appearing.  HENT:     Right Ear: External ear normal.     Left Ear: External ear normal.  Eyes:     General: No scleral icterus.       Right eye: No discharge.        Left eye: No discharge.     Extraocular Movements: Extraocular movements intact.     Conjunctiva/sclera: Conjunctivae normal.     Pupils: Pupils are equal, round, and reactive to light.     Comments: L upper lid is a little swollen, and inner L lateral lid has erythema. I did not see any ulcers with the otoscope magnifying glass.   Pulmonary:     Effort: Pulmonary effort is normal.  Musculoskeletal:        General: Normal range of motion.     Cervical back: Neck supple.  Skin:    General: Skin is warm and dry.  Neurological:     Mental Status: She is alert and oriented to person, place, and time.  Psychiatric:        Mood and Affect: Mood normal.        Behavior: Behavior normal.      UC Treatments / Results  Labs (all labs ordered are listed, but only abnormal results are displayed) Labs Reviewed - No data to display  EKG   Radiology No results found.  Procedures Procedures (including critical care time)  Medications Ordered in UC Medications - No data to display  Initial Impression / Assessment and Plan / UC Course  I have reviewed the triage vital signs and the nursing notes.  L upper eye lid sty  She was placed on Gentamicin eye gtts as noted.  See instructions.  Final Clinical Impressions(s) / UC Diagnoses   Final diagnoses:  Hordeolum internum of left upper eyelid     Discharge Instructions      Use warm compresses for 15 minutes 2-3 times a day for 3 days on area of swelling.      ED Prescriptions     Medication Sig Dispense Auth. Provider   trimethoprim-polymyxin b (POLYTRIM) ophthalmic solution  (Status: Discontinued) Place 1 drop into the left eye 3  (three) times daily. X 7 days 10 mL Rodriguez-Southworth, Sunday Spillers, PA-C   gentamicin (GARAMYCIN) 0.3 % ophthalmic solution Place 1 drop into the left eye 3 (three) times daily. For 7 days 5 mL Rodriguez-Southworth, Sunday Spillers, PA-C      PDMP not reviewed this encounter.   Shelby Mattocks, PA-C 10/07/22 1626

## 2022-10-07 NOTE — Discharge Instructions (Signed)
Use warm compresses for 15 minutes 2-3 times a day for 3 days on area of swelling.

## 2023-01-06 ENCOUNTER — Ambulatory Visit
Admission: RE | Admit: 2023-01-06 | Discharge: 2023-01-06 | Disposition: A | Discharge status: Home / Self Care | Payer: 59 | Source: Ambulatory Visit | Attending: Nurse Practitioner | Admitting: Nurse Practitioner

## 2023-01-06 VITALS — BP 150/98 | HR 74 | Temp 98.8°F | Resp 16

## 2023-01-06 DIAGNOSIS — S46912A Strain of unspecified muscle, fascia and tendon at shoulder and upper arm level, left arm, initial encounter: Secondary | ICD-10-CM

## 2023-01-06 MED ORDER — KETOROLAC TROMETHAMINE 30 MG/ML IJ SOLN
30.0000 mg | Freq: Once | INTRAMUSCULAR | Status: AC
Start: 2023-01-06 — End: 2023-01-06
  Administered 2023-01-06: 30 mg via INTRAMUSCULAR

## 2023-01-06 NOTE — ED Provider Notes (Signed)
[facility]    CSN: [id number] Arrival date & time: 01/06/23  1132      History   Chief Complaint Chief Complaint  Patient presents with   Shoulder Pain    Possible bursitis, shooting pain radiating down arm when I try to raise it or move it - Entered by patient    HPI Virginia Barrett is a 47 y.o. female.   Patient presents today with left shoulder pain has been ongoing for 1 day.  She denies recent fall, trauma, or injury to the shoulder.  Reports initially, she thought she maybe slept on it wrong because she sleeps on her left side with her arm elevated above her head.  She reports today, she began having shooting pain down the arm and some tingling in her fingertips.  Patient denies fever, nausea/vomiting, swelling, redness, or bruising of the shoulder.  She thinks she may have bursitis.  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  Reports she has pain with any movement of the shoulder, no pain at rest.  Has taken Tylenol and used ice on the shoulder which did seem to help a little bit temporarily.    Past Medical History:  Diagnosis Date   Acute frontal sinusitis, unspecified    Bronchitis, not specified as acute or chronic    Constipation    Diarrhea    Disappearance and death of family member    Diverticulitis    Diverticulitis of small intestine without perforation or abscess without bleeding    Dizziness and giddiness    Headache(784.0)    occasional    Insomnia, unspecified    Lumbago    Obesity, unspecified    Pain in right knee    Pneumonia    hx of walking pneumonia    Precancerous skin lesion    Sinus infection    diagnosed on 01/28/13     Patient Active Problem List   Diagnosis Date Noted   History of diverticulitis 07/10/2020   History of colonic polyps 07/10/2020   Diverticulitis of small intestine without perforation or abscess without  bleeding    Insomnia, unspecified    Acute frontal sinusitis, unspecified    Obesity, unspecified    Pain in right knee    Diverticulitis     Past Surgical History:  Procedure Laterality Date   BIOPSY  08/01/2020   Procedure: BIOPSY;  Surgeon: Malissa Hippo, MD;  Location: [facility];  Service: Endoscopy;;  rectal polyps times 2   COLONOSCOPY N/A 11/17/2012   Procedure: COLONOSCOPY;  Surgeon: Malissa Hippo, MD;  Location: [facility];  Service: Endoscopy;  Laterality: N/A;  1200-moved to1030 Ann to notify pt   COLONOSCOPY WITH PROPOFOL N/A 08/01/2020   Procedure: COLONOSCOPY WITH PROPOFOL;  Surgeon: Malissa Hippo, MD;  Location: [facility];  Service: Endoscopy;  Laterality: N/A;  8:30   LAPAROSCOPIC SIGMOID COLECTOMY N/A 02/03/2013   Procedure: LAPAROSCOPIC SIGMOID COLECTOMY;  Surgeon: Atilano Ina, MD;  Location: [facility];  Service: General;  Laterality: N/A;   MOUTH SURGERY     wisdom teeth removed   PROCTOSCOPY  02/03/2013   Procedure: PROCTOSCOPY;  Surgeon: Atilano Ina, MD;  Location: [facility];  Service: General;;    OB History   No obstetric history on file.      Home Medications    Prior to Admission  medications   Medication Sig Start Date End Date Taking? Authorizing Provider  EPINEPHrine 0.3 mg/0.3 mL IJ SOAJ injection Inject 0.3 mg into the muscle as needed for anaphylaxis. Patient taking differently: Inject 0.3 mg into the muscle as needed for anaphylaxis. Needs refill 06/18/20   Wurst, [place], PA-C  famotidine (PEPCID) 20 MG tablet Take 1 tablet (20 mg total) by mouth 2 (two) times daily. Patient not taking: Reported on 07/30/2020 06/18/20   Alvino Chapel, [place], PA-C  gentamicin (GARAMYCIN) 0.3 % ophthalmic solution Place 1 drop into the left eye 3 (three) times daily. For 7 days 10/07/22   Rodriguez-Southworth, Nettie Elm, PA-C  hyoscyamine (LEVBID) 0.375 MG 12 hr tablet Take 1 tablet (0.375 mg total) by mouth 2 (two) times daily as needed. Patient taking  differently: Take 0.375 mg by mouth 2 (two) times daily as needed. As needed 07/05/19   Malissa Hippo, MD  loratadine (CLARITIN) 10 MG tablet Take 10 mg by mouth daily.    [provider]  methocarbamol (ROBAXIN) 750 MG tablet TAKE 1 TABLET(750 MG) BY MOUTH EVERY 8 HOURS AS NEEDED FOR MUSCLE SPASMS 05/07/22   Kathryne Hitch, MD  ondansetron (ZOFRAN) 4 MG tablet Take 1 tablet (4 mg total) by mouth every 8 (eight) hours as needed for nausea or vomiting. 05/03/20   Carroll Sage, PA-C  valACYclovir (VALTREX) 1000 MG tablet Take 1 tablet (1,000 mg total) by mouth 2 (two) times daily. Patient taking differently: Take 1,000 mg by mouth 2 (two) times daily. As needed 08/15/21   Particia Nearing, PA-C  diphenhydrAMINE (BENADRYL) 25 MG tablet Take 1 tablet (25 mg total) by mouth every 6 (six) hours. 11/14/18 09/27/19  Vanetta Mulders, MD    Family History Family History  Problem Relation Age of Onset   Colon polyps Mother    Diabetes Mother    COPD Mother    Colon polyps Other    Hodgkin's lymphoma Father    Cancer Maternal Aunt        breast   Cancer Maternal Grandmother        breast   Colon cancer Neg Hx     Social History Social History   Tobacco Use   Smoking status: Never   Smokeless tobacco: Never  Vaping Use   Vaping Use: Never used  Substance Use Topics   Alcohol use: Yes    Comment: rarely   Drug use: No     Allergies   Cinnamon, Elemental sulfur, Codeine, Penicillins, Red dye, and Ciprofloxacin   Review of Systems Review of Systems Per HPI  Physical Exam Triage Vital Signs ED Triage Vitals  Enc Vitals Group     BP 01/06/23 1158 (!) 150/98     Pulse Rate 01/06/23 1158 74     Resp 01/06/23 1158 16     Temp 01/06/23 1158 98.8 F (37.1 C)     Temp Source 01/06/23 1158 Oral     SpO2 01/06/23 1158 95 %     Weight --      Height --      Head Circumference --      Peak Flow --      Pain Score 01/06/23 1200 8     Pain Loc --       Pain Edu? --      Excl. in GC? --    No data found.  Updated Vital Signs BP (!) 150/98 (BP Location: Right Arm)   Pulse 74   Temp 98.8 F (  37.1 C) (Oral)   Resp 16   LMP 12/16/2022 (Exact Date)   SpO2 95%   Visual Acuity Right Eye Distance:   Left Eye Distance:   Bilateral Distance:    Right Eye Near:   Left Eye Near:    Bilateral Near:     Physical Exam Vitals and nursing note reviewed.  Constitutional:      General: She is not in acute distress.    Appearance: Normal appearance. She is not toxic-appearing.  HENT:     Mouth/Throat:     Mouth: Mucous membranes are moist.     Pharynx: Oropharynx is clear.  Pulmonary:     Effort: Pulmonary effort is normal. No respiratory distress.  Musculoskeletal:     Comments: Inspection: No swelling, obvious deformity, redness, or bruising to the left shoulder or deltoid Palpation: Left shoulder diffusely tender to palpation anteriorly and laterally; no obvious deformities palpated ROM: Difficult to assess secondary to pain Strength: 5/5 bilateral upper extremities Neurovascular: neurovascularly intact in left and right upper extremity   Skin:    General: Skin is warm and dry.     Capillary Refill: Capillary refill takes less than 2 seconds.     Coloration: Skin is not jaundiced or pale.     Findings: No erythema.  Neurological:     Mental Status: She is alert and oriented to person, place, and time.  Psychiatric:        Behavior: Behavior is cooperative.      UC Treatments / Results  Labs (all labs ordered are listed, but only abnormal results are displayed) Labs Reviewed - No data to display  EKG   Radiology No results found.  Procedures Procedures (including critical care time)  Medications Ordered in UC Medications  ketorolac (TORADOL) 30 MG/ML injection 30 mg (30 mg Intramuscular Given 01/06/23 1219)    Initial Impression / Assessment and Plan / UC Course  I have reviewed the triage vital signs and the  nursing notes.  Pertinent labs & imaging results that were available during my care of the patient were reviewed by me and considered in my medical decision making (see chart for details).   Patient is well-appearing, afebrile, not tachycardic, not tachypneic, oxygenating well on room air.  Patient is mildly hypertensive today in urgent care.  1. Strain of left shoulder, initial encounter Toradol 30 mg IM given today in urgent care for pain control Start Tylenol 500 to 1000 mg every 6 hours, continue ice Also recommended muscle relaxant-patient states that she has been prescribed this previously and has a supply at home Recommended light range of motion/stretching exercises Note given for work Follow-up with Ortho for persistent/worsening symptoms despite treatment  The patient was given the opportunity to ask questions.  All questions answered to their satisfaction.  The patient is in agreement to this plan.    Final Clinical Impressions(s) / UC Diagnoses   Final diagnoses:  Strain of left shoulder, initial encounter     Discharge Instructions      I suspect your left shoulder is strained.  We have given you a shot of Toradol today to help with pain and inflammation.  Continue Tylenol 500 to 1000 mg every 6 hours along with ice 15 minutes on, 45 minutes off every hour while awake.  You can try the muscle relaxant that he given previously prescribed at nighttime as needed for muscular pain.  Start light range of motion/stretching exercises when pain improves.  If pain persists despite treatment,  recommend follow-up with orthopedic provider and contact information has been provided.    ED Prescriptions   None    PDMP not reviewed this encounter.   Valentino Nose, NP 01/06/23 1724

## 2023-01-06 NOTE — ED Triage Notes (Addendum)
Pt c/o left shoulder possible bursitis, that started yesterday around lunch time, has a shooting pain that radiates down the arm when trying to move the arm causing numbness

## 2023-01-06 NOTE — Discharge Instructions (Signed)
I suspect your left shoulder is strained.  We have given you a shot of Toradol today to help with pain and inflammation.  Continue Tylenol 500 to 1000 mg every 6 hours along with ice 15 minutes on, 45 minutes off every hour while awake.  You can try the muscle relaxant that he given previously prescribed at nighttime as needed for muscular pain.  Start light range of motion/stretching exercises when pain improves.  If pain persists despite treatment, recommend follow-up with orthopedic provider and contact information has been provided.

## 2023-06-09 ENCOUNTER — Other Ambulatory Visit: Payer: Self-pay

## 2023-06-09 ENCOUNTER — Ambulatory Visit
Admission: EM | Admit: 2023-06-09 | Discharge: 2023-06-09 | Disposition: A | Discharge status: Home / Self Care | Payer: 59 | Attending: Nurse Practitioner | Admitting: Nurse Practitioner

## 2023-06-09 ENCOUNTER — Ambulatory Visit: Payer: Self-pay

## 2023-06-09 DIAGNOSIS — R002 Palpitations: Secondary | ICD-10-CM

## 2023-06-09 DIAGNOSIS — M25562 Pain in left knee: Secondary | ICD-10-CM

## 2023-06-09 DIAGNOSIS — R030 Elevated blood-pressure reading, without diagnosis of hypertension: Secondary | ICD-10-CM | POA: Diagnosis not present

## 2023-06-09 DIAGNOSIS — G8929 Other chronic pain: Secondary | ICD-10-CM | POA: Diagnosis not present

## 2023-06-09 MED ORDER — NAPROXEN 500 MG PO TABS: 500.0000 mg | ORAL_TABLET | Freq: Two times a day (BID) | ORAL | 0 refills | Status: DC

## 2023-06-09 MED ORDER — ATENOLOL 25 MG PO TABS: 25.0000 mg | ORAL_TABLET | Freq: Every day | ORAL | 0 refills | Status: DC

## 2023-06-09 MED ORDER — AMLODIPINE BESYLATE 5 MG PO TABS: 5.0000 mg | ORAL_TABLET | Freq: Every day | ORAL | 0 refills | Status: DC
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
## 2023-06-09 NOTE — Discharge Instructions (Addendum)
EKG today looks great.  You can resume taking the atenolol as your cardiologist previously recommended.  In addition, start taking the amlodipine to help lower blood pressure.  Recommend checking your blood pressure at home with an upper arm cuff.  Make sure feet are flat on the ground and your back is supported in a chair in the arm you are checking the blood pressure and is at the level of your heart when checking it.  Blood pressure to be less than 140/90.  Recommend establishing care with a primary care provider to further treat the blood pressure and check blood work.  Regarding her knee, recommend resuming the PT exercises you were taught previously, using the Ace wrap when walking long distances, and taking naproxen up to twice daily as needed with food to help with pain.  Recommend follow-up with orthopedic provider if pain does not improve with treatment.

## 2023-07-07 ENCOUNTER — Encounter (INDEPENDENT_AMBULATORY_CARE_PROVIDER_SITE_OTHER): Payer: Self-pay | Admitting: Gastroenterology

## 2023-07-07 ENCOUNTER — Ambulatory Visit (INDEPENDENT_AMBULATORY_CARE_PROVIDER_SITE_OTHER): Payer: 59 | Admitting: Gastroenterology

## 2023-07-07 VITALS — BP 116/85 | HR 61 | Temp 98.1°F | Ht 66.5 in | Wt 303.8 lb

## 2023-07-07 DIAGNOSIS — Z8719 Personal history of other diseases of the digestive system: Secondary | ICD-10-CM

## 2023-07-07 DIAGNOSIS — R152 Fecal urgency: Secondary | ICD-10-CM

## 2023-07-07 MED ORDER — METRONIDAZOLE 500 MG PO TABS: 500.0000 mg | ORAL_TABLET | Freq: Three times a day (TID) | ORAL | 1 refills | Status: DC

## 2023-07-07 MED ORDER — HYOSCYAMINE SULFATE ER 0.375 MG PO TB12: 0.3750 mg | ORAL_TABLET | Freq: Two times a day (BID) | ORAL | 1 refills | Status: DC | PRN

## 2023-07-07 MED ORDER — CIPROFLOXACIN HCL 500 MG PO TABS: 500.0000 mg | ORAL_TABLET | Freq: Two times a day (BID) | ORAL | 1 refills | Status: DC

## 2023-07-07 NOTE — Progress Notes (Unsigned)
Referring Provider: No ref. provider found Primary Care Physician:  Patient, No Pcp Per Primary GI Physician: Previously Dr. Karilyn Cota (Dr. Tasia Catchings)  Chief Complaint  Patient presents with   Diverticulitis    Follow up on diverticulitis. Patient states she takes hysocamine as needed.    HPI:   Virginia Barrett is a 47 y.o. female with past medical history of diverticulitis, insomnia, sigmoid colon resection   Patient presenting today for history of recurrent diverticulitis   Pertinent history: Diverticulitis with bowel microperforation in the sigmoid colon in October 2023. She underwent laparoscopic sigmoid colon resection in July 2014. She had no issues for 3 years. She had an episode of diverticulitis in July 2017 and responded to antibiotic therapy. CT confirmed this diagnosis. She had another episode in April 2020 and she was seen in emergency room and once again CT confirmed this diagnosis.   Last seen here in December 2021, at that time using miralax, no abdominal pain, using Levbid PRN, on high fiber diet. Maintained with standing prescription for flagyl and cipro in case of a diverticulitis flare, given her significant history.   Present: States that she has history of recurrent episodes of diverticulitis, She has maybe 1 per year. Notes that dr Karilyn Cota always kept her with a refill of cipro and flagyl given her history. She has a BM once daily, no rectal bleeding or melena. She notes she had a flare about 2 months ago, had some LLQ discomfort, she waited a few days and then noted that pain was sharp and she could not get comfortable. She started her cipro and flagyl and symptoms resolved. She reports she will typically wait a few days and change her diet if she feels symptoms coming on, if symptoms do not improve will start antibiotics. She does note eating some corn prior to her last episode. Inquires about consumption of nuts/seeds.   Taking naproxen PRN for her knee right now, tries to  avoid advil.   Not taking any fiber supplements currently. Uses miralax on occasion if she feels constipated. Water intake is about 1L per day. She has taken levbid in the past PRN for some fecal urgency if she was going to be going out. She notes that she has some fecal urgency usually after meals since her colon resection in 2014.   Patient denies melena, hematochezia, nausea, vomiting, constipation, dysphagia, odyonophagia, early satiety or weight loss.   Last Colonoscopy: 2022- Diverticulosis in the sigmoid colon, in the                            descending colon, at the hepatic flexure and in the                            ascending colon.                           - Patent end-to-end ileo-anal anastomosis,                            characterized by healthy appearing mucosa.                           - Two small polyps in the rectum. Biopsied.  Recommendations:  Repeat Colonoscopy in 7 years   Past Medical  History:  Diagnosis Date   Acute frontal sinusitis, unspecified    Bronchitis, not specified as acute or chronic    Constipation    Diarrhea    Disappearance and death of family member    Diverticulitis    Diverticulitis of small intestine without perforation or abscess without bleeding    Dizziness and giddiness    Headache(784.0)    occasional    Insomnia, unspecified    Lumbago    Obesity, unspecified    Pain in right knee    Pneumonia    hx of walking pneumonia    Precancerous skin lesion    Sinus infection    diagnosed on 01/28/13     Past Surgical History:  Procedure Laterality Date   BIOPSY  08/01/2020   Procedure: BIOPSY;  Surgeon: Malissa Hippo, MD;  [facility];  Service: Endoscopy;;  rectal polyps times 2   COLONOSCOPY N/A 11/17/2012   Procedure: COLONOSCOPY;  Surgeon: Malissa Hippo, MD;  [facility]: [facility];  Service: Endoscopy;  Laterality: N/A;  1200-moved to1030 Ann to notify pt   COLONOSCOPY WITH PROPOFOL N/A 08/01/2020   Procedure:  COLONOSCOPY WITH PROPOFOL;  Surgeon: Malissa Hippo, MD;  [facility]: [facility];  Service: Endoscopy;  Laterality: N/A;  8:30   LAPAROSCOPIC SIGMOID COLECTOMY N/A 02/03/2013   Procedure: LAPAROSCOPIC SIGMOID COLECTOMY;  Surgeon: Atilano Ina, MD;  Location: [facility];  Service: General;  Laterality: N/A;   MOUTH SURGERY     wisdom teeth removed   PROCTOSCOPY  02/03/2013   Procedure: PROCTOSCOPY;  Surgeon: Atilano Ina, MD;  Location: [facility];  Service: General;;    Current Outpatient Medications  Medication Sig Dispense Refill   atenolol (TENORMIN) 25 MG tablet Take 1 tablet (25 mg total) by mouth daily. 30 tablet 0   EPINEPHrine 0.3 mg/0.3 mL IJ SOAJ injection Inject 0.3 mg into the muscle as needed for anaphylaxis. (Patient taking differently: Inject 0.3 mg into the muscle as needed for anaphylaxis. Needs refill) 1 each 0   famotidine (PEPCID) 20 MG tablet Take 1 tablet (20 mg total) by mouth 2 (two) times daily. 10 tablet 0   hyoscyamine (LEVBID) 0.375 MG 12 hr tablet Take 1 tablet (0.375 mg total) by mouth 2 (two) times daily as needed. (Patient taking differently: Take 0.375 mg by mouth 2 (two) times daily as needed. As needed) 60 tablet 0   loratadine (CLARITIN) 10 MG tablet Take 10 mg by mouth daily.     naproxen (NAPROSYN) 500 MG tablet Take 1 tablet (500 mg total) by mouth 2 (two) times daily with a meal. 30 tablet 0   valACYclovir (VALTREX) 1000 MG tablet Take 1 tablet (1,000 mg total) by mouth 2 (two) times daily. (Patient taking differently: Take 1,000 mg by mouth 2 (two) times daily. As needed) 20 tablet 5   amLODipine (NORVASC) 5 MG tablet Take 1 tablet (5 mg total) by mouth daily. (Patient not taking: Reported on 07/07/2023) 30 tablet 0   No current facility-administered medications for this visit.    Allergies as of 07/07/2023 - Review Complete 07/07/2023  Allergen Reaction Noted   Cinnamon Anaphylaxis 12/08/2012   Elemental sulfur Hives 12/08/2012   Codeine Hives and  Swelling 05/15/2012   Penicillins Hives 05/15/2012   Red dye #40 (allura red) Hives 11/12/2012   Ciprofloxacin Rash 10/13/2012    Family History  Problem Relation Age of Onset   Colon polyps Mother    Diabetes Mother  COPD Mother    Colon polyps Other    Hodgkin's lymphoma Father    Cancer Maternal Aunt        breast   Cancer Maternal Grandmother        breast   Colon cancer Neg Hx     Social History   Socioeconomic History   Marital status: Significant Other    Spouse name: Not on file   Number of children: Not on file   Years of education: Not on file   Highest education level: Not on file  Occupational History   Not on file  Tobacco Use   Smoking status: Never   Smokeless tobacco: Never  Vaping Use   Vaping status: Never Used  Substance and Sexual Activity   Alcohol use: Yes    Comment: rarely   Drug use: No   Sexual activity: Never    Birth control/protection: None  Other Topics Concern   Not on file  Social History Narrative   Not on file   Social Determinants of Health   Financial Resource Strain: Not on file  Food Insecurity: Not on file  Transportation Needs: Not on file  Physical Activity: Not on file  Stress: Not on file  Social Connections: Not on file    Review of systems General: negative for malaise, night sweats, fever, chills, weight loss Neck: Negative for lumps, goiter, pain and significant neck swelling Resp: Negative for cough, wheezing, dyspnea at rest CV: Negative for chest pain, leg swelling, palpitations, orthopnea GI: denies melena, hematochezia, nausea, vomiting, diarrhea, constipation, dysphagia, odyonophagia, early satiety or unintentional weight loss. +fecal urgency  MSK: Negative for joint pain or swelling, back pain, and muscle pain. Derm: Negative for itching or rash Psych: Denies depression, anxiety, memory loss, confusion. No homicidal or suicidal ideation.  Heme: Negative for prolonged bleeding, bruising easily, and  swollen nodes. Endocrine: Negative for cold or heat intolerance, polyuria, polydipsia and goiter. Neuro: negative for tremor, gait imbalance, syncope and seizures. The remainder of the review of systems is noncontributory.  Physical Exam: BP 116/85 (BP Location: Right Arm, Patient Position: Sitting, Cuff Size: Large)   Pulse 61   Temp 98.1 F (36.7 C) (Oral)   Ht 5' 6.5" (1.689 m)   Wt (!) 303 lb 12.8 oz (137.8 kg)   BMI 48.30 kg/m  General:   Alert and oriented. No distress noted. Pleasant and cooperative.  Head:  Normocephalic and atraumatic. Eyes:  Conjuctiva clear without scleral icterus. Mouth:  Oral mucosa pink and moist. Good dentition. No lesions. Heart: Normal rate and rhythm, s1 and s2 heart sounds present.  Lungs: Clear lung sounds in all lobes. Respirations equal and unlabored. Abdomen:  +BS, soft, non-tender and non-distended. No rebound or guarding. No HSM or masses noted. Derm: No palmar erythema or jaundice Msk:  Symmetrical without gross deformities. Normal posture. Extremities:  Without edema. Neurologic:  Alert and  oriented x4 Psych:  Alert and cooperative. Normal mood and affect.  Invalid input(s): "6 MONTHS"   ASSESSMENT: MAKHYA TUMBARELLO is a 47 y.o. female presenting today for follow up of recurrent diverticulitis   Patient with history of recurrent diverticulitis who required sigmoid colon resection in 2014. Has presented with clinical picture of recurrent diverticulitis maybe once per year since her surgery with a few CT confirmed diagnosis in that time. Has had standing prescription for Cipro and flagyl on hand in case of flare given her history. Last took her antibiotics in November for suspected flare. Last Colonoscopy  in 2022. She is feeling good overall. We discussed importance of avoiding all NSAIDs and high fiber diet with her history of diverticulitis. While most recent data does not indicate nuts and seeds have to be avoided, should be mindful of  trying to consume these in only small amounts and avoiding altogether if she does have a flare after consumption of these. I will provide standing RX with 1  refill of Cipro and flagyl for her to have on hand in case of a suspected flare, given her significant history, however, I discussed the importance of her making me aware if she does require using these medications.    PLAN:  Continue levbid PRN for fecal urgency  2. Cipro 500mg  BID x10 days, Flagyl 500mg  TID x10 days Rx to be used for suspected diverticulitis flare  3. Avoid NSAIDs 4. Start benefiber 1T daily with a meal 5. Pt to make me aware if she feels she is having a diverticulitis flare   All questions were answered, patient verbalized understanding and is in agreement with plan as outlined above.   Follow Up: 1 year   Vallorie Niccoli L. Jeanmarie Hubert, MSN, APRN, AGNP-C Adult-Gerontology Nurse Practitioner [facility]

## 2023-07-07 NOTE — Patient Instructions (Signed)
I will send refill of cipro and flagyl given your recurrent diverticulitis, however, if you have a flare and require antibiotics, please make me aware I have sent a refill of levbid to use as needed for fecal urgency as well As discussed you can try adding benefiber 1T daily as high fiber diet has been shown to help prevent diverticulitis There is no specific contraindication to eating seeds/nuts, however, be mindful of eating these in high volume or if you have a flare after consuming, you may want to avoid Please avoid NSAIDs (advil, aleve, naproxen, goody powder, ibuprofen) as these can be very hard on your GI tract, causing inflammation, ulcers and damage to the lining of your GI tract.   Follow up 1 year  It was a pleasure to see you today. I want to create trusting relationships with patients and provide genuine, compassionate, and quality care. I truly value your feedback! please be on the lookout for a survey regarding your visit with me today. I appreciate your input about our visit and your time in completing this!    Ginni Eichler L. Jeanmarie Hubert, MSN, APRN, AGNP-C Adult-Gerontology Nurse Practitioner [facility] at [facility]

## 2023-07-19 ENCOUNTER — Other Ambulatory Visit: Payer: Self-pay | Admitting: Nurse Practitioner

## 2023-07-19 ENCOUNTER — Other Ambulatory Visit: Payer: Self-pay | Admitting: Orthopaedic Surgery

## 2023-07-20 ENCOUNTER — Ambulatory Visit
Admission: RE | Admit: 2023-07-20 | Discharge: 2023-07-20 | Disposition: A | Discharge status: Home / Self Care | Payer: 59 | Source: Ambulatory Visit | Attending: Nurse Practitioner | Admitting: Nurse Practitioner

## 2023-07-20 VITALS — BP 127/94 | HR 87 | Temp 98.5°F | Resp 16

## 2023-07-20 DIAGNOSIS — J069 Acute upper respiratory infection, unspecified: Secondary | ICD-10-CM | POA: Diagnosis not present

## 2023-07-20 DIAGNOSIS — R062 Wheezing: Secondary | ICD-10-CM | POA: Diagnosis not present

## 2023-07-20 MED ORDER — PREDNISONE 20 MG PO TABS: 40.0000 mg | ORAL_TABLET | Freq: Every day | ORAL | 0 refills | Status: AC

## 2023-07-20 MED ORDER — BENZONATATE 100 MG PO CAPS: 100.0000 mg | ORAL_CAPSULE | Freq: Three times a day (TID) | ORAL | 0 refills | Status: DC | PRN

## 2023-07-20 MED ORDER — PROMETHAZINE-DM 6.25-15 MG/5ML PO SYRP: 5.0000 mL | ORAL_SOLUTION | Freq: Every evening | ORAL | 0 refills | Status: DC | PRN

## 2023-07-20 NOTE — ED Provider Notes (Signed)
[facility]    CSN: [id number] Arrival date & time: 07/20/23  1158      History   Chief Complaint Chief Complaint  Patient presents with   Cough    Partial nasal congestion, wheezing when breathing, excessive mucus - Entered by patient    HPI Virginia Barrett is a 47 y.o. female.   Patient presents today with 5-day history of bodyaches and chills, dry cough with a little bit of congestion that was blood-tinged last night, "rattling" when she breathes, shortness of breath with activity, chest pressure at night when she takes a deep breath, stuffy nose and hoarseness, sore throat that is now improved, pressure above her nasal bridge, nausea without vomiting, decreased appetite, and fatigue.  No known fevers, runny nose, ear pain, abdominal pain, vomiting.  Reports she has been around multiple coworkers at work who have been sick with similar symptoms.  Has been taking Mucinex over-the-counter sinus medication as well as pushing plenty fluids without much improvement.    Past Medical History:  Diagnosis Date   Acute frontal sinusitis, unspecified    Bronchitis, not specified as acute or chronic    Constipation    Diarrhea    Disappearance and death of family member    Diverticulitis    Diverticulitis of small intestine without perforation or abscess without bleeding    Dizziness and giddiness    Headache(784.0)    occasional    Insomnia, unspecified    Lumbago    Obesity, unspecified    Pain in right knee    Pneumonia    hx of walking pneumonia    Precancerous skin lesion    Sinus infection    diagnosed on 01/28/13     Patient Active Problem List   Diagnosis Date Noted   History of diverticulitis 07/10/2020   History of colonic polyps 07/10/2020   Diverticulitis of small intestine without perforation or abscess without bleeding    Insomnia, unspecified    Acute frontal sinusitis, unspecified    Obesity, unspecified    Pain in right knee    Diverticulitis      Past Surgical History:  Procedure Laterality Date   BIOPSY  08/01/2020   Procedure: BIOPSY;  Surgeon: Malissa Hippo, MD;  [facility];  Service: Endoscopy;;  rectal polyps times 2   COLONOSCOPY N/A 11/17/2012   Procedure: COLONOSCOPY;  Surgeon: Malissa Hippo, MD;  [facility];  Service: Endoscopy;  Laterality: N/A;  1200-moved to1030 Ann to notify pt   COLONOSCOPY WITH PROPOFOL N/A 08/01/2020   Procedure: COLONOSCOPY WITH PROPOFOL;  Surgeon: Malissa Hippo, MD;  [facility];  Service: Endoscopy;  Laterality: N/A;  8:30   LAPAROSCOPIC SIGMOID COLECTOMY N/A 02/03/2013   Procedure: LAPAROSCOPIC SIGMOID COLECTOMY;  Surgeon: Atilano Ina, MD;  Location: [facility];  Service: General;  Laterality: N/A;   MOUTH SURGERY     wisdom teeth removed   PROCTOSCOPY  02/03/2013   Procedure: PROCTOSCOPY;  Surgeon: Atilano Ina, MD;  Location: [facility];  Service: General;;    OB History   No obstetric history on file.      Home Medications    Prior to Admission medications   Medication Sig Start Date End Date Taking? Authorizing Provider  atenolol (TENORMIN) 25 MG tablet Take 1 tablet (25 mg total) by mouth daily. 06/09/23  Yes Valentino Nose, NP  benzonatate (TESSALON) 100 MG capsule Take 1 capsule (100 mg total) by mouth  3 (three) times daily as needed for cough. Do not take with alcohol or while driving or operating heavy machinery.  May cause drowsiness. 07/20/23  Yes Valentino Nose, NP  predniSONE (DELTASONE) 20 MG tablet Take 2 tablets (40 mg total) by mouth daily with breakfast for 5 days. 07/20/23 07/25/23 Yes Valentino Nose, NP  promethazine-dextromethorphan (PROMETHAZINE-DM) 6.25-15 MG/5ML syrup Take 5 mLs by mouth at bedtime as needed for cough. Do not take with alcohol or while driving or operating heavy machinery.  May cause drowsiness. 07/20/23  Yes Cathlean Marseilles A, NP  EPINEPHrine 0.3 mg/0.3 mL IJ SOAJ injection Inject 0.3 mg into  the muscle as needed for anaphylaxis. Patient taking differently: Inject 0.3 mg into the muscle as needed for anaphylaxis. Needs refill 06/18/20   Wurst, [place], PA-C  famotidine (PEPCID) 20 MG tablet Take 1 tablet (20 mg total) by mouth 2 (two) times daily. 06/18/20   Wurst, [place], PA-C  hyoscyamine (LEVBID) 0.375 MG 12 hr tablet Take 1 tablet (0.375 mg total) by mouth 2 (two) times daily as needed. 07/07/23   Carlan, Chelsea L, NP  loratadine (CLARITIN) 10 MG tablet Take 10 mg by mouth daily.    [provider]  naproxen (NAPROSYN) 500 MG tablet TAKE 1 TABLET(500 MG) BY MOUTH TWICE DAILY WITH A MEAL 07/20/23   Donita Brooks, MD  valACYclovir (VALTREX) 1000 MG tablet Take 1 tablet (1,000 mg total) by mouth 2 (two) times daily. Patient taking differently: Take 1,000 mg by mouth 2 (two) times daily. As needed 08/15/21   Particia Nearing, PA-C  diphenhydrAMINE (BENADRYL) 25 MG tablet Take 1 tablet (25 mg total) by mouth every 6 (six) hours. 11/14/18 09/27/19  Vanetta Mulders, MD    Family History Family History  Problem Relation Age of Onset   Colon polyps Mother    Diabetes Mother    COPD Mother    Colon polyps Other    Hodgkin's lymphoma Father    Cancer Maternal Aunt        breast   Cancer Maternal Grandmother        breast   Colon cancer Neg Hx     Social History Social History   Tobacco Use   Smoking status: Never   Smokeless tobacco: Never  Vaping Use   Vaping status: Never Used  Substance Use Topics   Alcohol use: Yes    Comment: rarely   Drug use: No     Allergies   Cinnamon, Elemental sulfur, Codeine, Penicillins, Red dye #40 (allura red), and Ciprofloxacin   Review of Systems Review of Systems Per HPI  Physical Exam Triage Vital Signs ED Triage Vitals [07/20/23 1242]  Encounter Vitals Group     BP (!) 127/94     Systolic BP Percentile      Diastolic BP Percentile      Pulse Rate 87     Resp 16     Temp 98.5 F (36.9 C)     Temp  Source Oral     SpO2 97 %     Weight      Height      Head Circumference      Peak Flow      Pain Score 0     Pain Loc      Pain Education      Exclude from Growth Chart    No data found.  Updated Vital Signs BP (!) 127/94 (BP Location: Right Arm)   Pulse 87  Temp 98.5 F (36.9 C) (Oral)   Resp 16   LMP 07/16/2023 (Exact Date)   SpO2 97%   Visual Acuity Right Eye Distance:   Left Eye Distance:   Bilateral Distance:    Right Eye Near:   Left Eye Near:    Bilateral Near:     Physical Exam Vitals and nursing note reviewed.  Constitutional:      General: She is not in acute distress.    Appearance: Normal appearance. She is not ill-appearing or toxic-appearing.  HENT:     Head: Normocephalic and atraumatic.     Right Ear: Tympanic membrane, ear canal and external ear normal.     Left Ear: Tympanic membrane, ear canal and external ear normal.     Nose: No congestion or rhinorrhea.     Mouth/Throat:     Mouth: Mucous membranes are moist.     Pharynx: Oropharynx is clear. No oropharyngeal exudate or posterior oropharyngeal erythema.  Eyes:     General: No scleral icterus.    Extraocular Movements: Extraocular movements intact.  Cardiovascular:     Rate and Rhythm: Normal rate and regular rhythm.  Pulmonary:     Effort: Pulmonary effort is normal. No respiratory distress.     Breath sounds: Wheezing present. No rhonchi or rales.  Musculoskeletal:     Cervical back: Normal range of motion and neck supple.  Lymphadenopathy:     Cervical: No cervical adenopathy.  Skin:    General: Skin is warm and dry.     Coloration: Skin is not jaundiced or pale.     Findings: No erythema or rash.  Neurological:     Mental Status: She is alert and oriented to person, place, and time.  Psychiatric:        Behavior: Behavior is cooperative.      UC Treatments / Results  Labs (all labs ordered are listed, but only abnormal results are displayed) Labs Reviewed - No data to  display  EKG   Radiology No results found.  Procedures Procedures (including critical care time)  Medications Ordered in UC Medications - No data to display  Initial Impression / Assessment and Plan / UC Course  I have reviewed the triage vital signs and the nursing notes.  Pertinent labs & imaging results that were available during my care of the patient were reviewed by me and considered in my medical decision making (see chart for details).   Patient is well-appearing, normotensive, afebrile, not tachycardic, not tachypneic, oxygenating well on room air.    1. Viral URI with cough 2.  Wheezing Overall, vitals and examination are reassuring  Viral testing deferred given length of symptoms Start oral prednisone to help with wheezing, cough suppressant medication Other supportive care discussed  ER and return precautions discussed Work excuse provided  The patient was given the opportunity to ask questions.  All questions answered to their satisfaction.  The patient is in agreement to this plan.    Final Clinical Impressions(s) / UC Diagnoses   Final diagnoses:  Viral URI with cough  Wheezing     Discharge Instructions      You were seen today for a viral upper respiratory infection and wheezing in your lungs.  Start taking the oral prednisone and take as prescribed.  Symptoms should improve over the next week to 10 days.  If you develop chest pain or shortness of breath, go to the emergency room.  Some things that can make you feel better are: -  Increased rest - Increasing fluid with water/sugar free electrolytes - Acetaminophen and ibuprofen as needed for fever/pain - Salt water gargling, chloraseptic spray and throat lozenges - OTC guaifenesin (Mucinex) 600 mg twice daily for congestion - Saline sinus flushes or a neti pot - Humidifying the air -Tessalon Perles every 8 hours as needed for dry cough and cough syrup at night time as needed for dry cough  Seek  care if symptoms do not improve with treatment     ED Prescriptions     Medication Sig Dispense Auth. Provider   benzonatate (TESSALON) 100 MG capsule Take 1 capsule (100 mg total) by mouth 3 (three) times daily as needed for cough. Do not take with alcohol or while driving or operating heavy machinery.  May cause drowsiness. 21 capsule Cathlean Marseilles A, NP   predniSONE (DELTASONE) 20 MG tablet Take 2 tablets (40 mg total) by mouth daily with breakfast for 5 days. 10 tablet Cathlean Marseilles A, NP   promethazine-dextromethorphan (PROMETHAZINE-DM) 6.25-15 MG/5ML syrup Take 5 mLs by mouth at bedtime as needed for cough. Do not take with alcohol or while driving or operating heavy machinery.  May cause drowsiness. 118 mL Valentino Nose, NP      PDMP not reviewed this encounter.   Valentino Nose, NP 07/20/23 [phone]

## 2023-07-20 NOTE — Discharge Instructions (Signed)
You were seen today for a viral upper respiratory infection and wheezing in your lungs.  Start taking the oral prednisone and take as prescribed.  Symptoms should improve over the next week to 10 days.  If you develop chest pain or shortness of breath, go to the emergency room.  Some things that can make you feel better are: - Increased rest - Increasing fluid with water/sugar free electrolytes - Acetaminophen and ibuprofen as needed for fever/pain - Salt water gargling, chloraseptic spray and throat lozenges - OTC guaifenesin (Mucinex) 600 mg twice daily for congestion - Saline sinus flushes or a neti pot - Humidifying the air -Tessalon Perles every 8 hours as needed for dry cough and cough syrup at night time as needed for dry cough  Seek care if symptoms do not improve with treatment

## 2023-07-20 NOTE — ED Triage Notes (Signed)
Cough, wheezing, nasal drainage x 5 day. Pt states last night she had tinged sputum. Taking mucinex and sinus OTC medication.

## 2023-08-04 ENCOUNTER — Ambulatory Visit: Payer: Self-pay | Admitting: Family Medicine

## 2023-08-18 ENCOUNTER — Ambulatory Visit (INDEPENDENT_AMBULATORY_CARE_PROVIDER_SITE_OTHER): Payer: 59 | Admitting: Family Medicine

## 2023-08-18 ENCOUNTER — Encounter: Payer: Self-pay | Admitting: Family Medicine

## 2023-08-18 VITALS — BP 142/80 | HR 90 | Temp 98.7°F | Ht 66.5 in | Wt 298.0 lb

## 2023-08-18 DIAGNOSIS — Z6841 Body Mass Index (BMI) 40.0 and over, adult: Secondary | ICD-10-CM | POA: Diagnosis not present

## 2023-08-18 DIAGNOSIS — F321 Major depressive disorder, single episode, moderate: Secondary | ICD-10-CM

## 2023-08-18 DIAGNOSIS — Z1331 Encounter for screening for depression: Secondary | ICD-10-CM

## 2023-08-18 DIAGNOSIS — R030 Elevated blood-pressure reading, without diagnosis of hypertension: Secondary | ICD-10-CM | POA: Diagnosis not present

## 2023-08-18 DIAGNOSIS — R1012 Left upper quadrant pain: Secondary | ICD-10-CM | POA: Diagnosis not present

## 2023-08-18 DIAGNOSIS — R7303 Prediabetes: Secondary | ICD-10-CM

## 2023-08-18 DIAGNOSIS — Z1231 Encounter for screening mammogram for malignant neoplasm of breast: Secondary | ICD-10-CM | POA: Diagnosis not present

## 2023-08-18 DIAGNOSIS — Z91018 Allergy to other foods: Secondary | ICD-10-CM

## 2023-08-18 DIAGNOSIS — F419 Anxiety disorder, unspecified: Secondary | ICD-10-CM | POA: Diagnosis not present

## 2023-08-18 DIAGNOSIS — Z0001 Encounter for general adult medical examination with abnormal findings: Secondary | ICD-10-CM

## 2023-08-18 DIAGNOSIS — Z136 Encounter for screening for cardiovascular disorders: Secondary | ICD-10-CM | POA: Diagnosis not present

## 2023-08-18 DIAGNOSIS — E559 Vitamin D deficiency, unspecified: Secondary | ICD-10-CM

## 2023-08-18 DIAGNOSIS — Z0000 Encounter for general adult medical examination without abnormal findings: Secondary | ICD-10-CM

## 2023-08-18 LAB — LIPID PANEL

## 2023-08-18 LAB — BAYER DCA HB A1C WAIVED: HB A1C (BAYER DCA - WAIVED): 6 % — ABNORMAL HIGH (ref 4.8–5.6)

## 2023-08-18 MED ORDER — ATENOLOL 25 MG PO TABS: 25.0000 mg | ORAL_TABLET | Freq: Every day | ORAL | 0 refills | Status: DC | PRN

## 2023-08-18 MED ORDER — SERTRALINE HCL 25 MG PO TABS: 25.0000 mg | ORAL_TABLET | Freq: Every day | ORAL | 0 refills | Status: DC

## 2023-08-18 MED ORDER — PANTOPRAZOLE SODIUM 40 MG PO TBEC: 40.0000 mg | DELAYED_RELEASE_TABLET | Freq: Every day | ORAL | 0 refills | Status: DC

## 2023-08-18 MED ORDER — EPINEPHRINE 0.3 MG/0.3ML IJ SOAJ: 0.3000 mg | INTRAMUSCULAR | 0 refills | Status: AC | PRN

## 2023-08-18 NOTE — Progress Notes (Signed)
Virginia Barrett is a 48 y.o. female presents to office today for annual physical exam examination.    Concerns today include: States that she had a history of low BP until she started care giving for her parent. States that she has constant anxiety and stress with that. Endorses spiraling with thoughts, unable to stop worrying, decreased motivation to do the things that she normally does. States that she had a panic attack last week. She is taking atenolol more for palpitations. She is taking it 4 days per week. Usually at night. States that she feels them more at night when she is settling down.  States that she was taking lexapro for a short period ~3 months. Believes that it was 5 mg dosage.  States that she did not feel a difference with taking it, so she stopped it. Would like to be on something for her anxiety but does not want to take medications unless they work for her. Denies thoughts of self harm.   Burring sensation in upper left abdomen. States that this is new. Started last week. Worse with sitting and after eating. States that she is taking pepcid and tums. Neither one are helping. Pain comes and goes. Denies changes to BM. Has been compliant with fiber gummies. States that this is different from diverticulitis flare. She is established with GI.   Occupation: Civil engineer, contracting for [employer]  Marital status: no concern for STDs  Diet: states that she is trying to cook more at home. Lost 5 lbs in one month. Increasing vegetables.  Exercise: started last week, walking at lunch at work for 30 minutes.  Substance use: occasional etoh  Last eye exam: UTD  Last dental exam: UTD  Last colonoscopy: UTD - due in 2 years  Last mammogram:  Last pap smear: completed at ob/gyn  Contraceptive: condoms  Refills needed today: epi-pen  Other specialists seen: ob/gyn, cardiology  Dermatology exam: declines  Fasting today:  no  Immunizations needed: Flu Vaccine: declines  Tdap Vaccine: UTD  - every  49yrs - (<3 lifetime doses or unknown): all wounds -- look up need for Tetanus IG - (>=3 lifetime doses): clean/minor wound if >13yrs from previous; all other wounds if >48yrs from previous Zoster Vaccine: no (those >50yo, once) Pneumonia Vaccine: no (those w/ risk factors) - (<49yr) Both: Immunocompromised, cochlear implant, CSF leak, asplenic, sickle cell, Chronic Renal Failure - (<40yr) PPSV-23 only: Heart dz, lung disease, DM, tobacco abuse, alcoholism, cirrhosis/liver disease. - (>52yr): PPSV13 then PPSV23 in 6-12mths;  - (>15yr): repeat PPSV23 once if pt received prior to 47yo and 4yrs have passed  Past Medical History:  Diagnosis Date   Acute frontal sinusitis, unspecified    Bronchitis, not specified as acute or chronic    Constipation    Diarrhea    Disappearance and death of family member    Diverticulitis    Diverticulitis of small intestine without perforation or abscess without bleeding    Dizziness and giddiness    Headache(784.0)    occasional    Insomnia, unspecified    Lumbago    Obesity, unspecified    Pain in right knee    Pneumonia    hx of walking pneumonia    Precancerous skin lesion    Sinus infection    diagnosed on 01/28/13    Social History   Socioeconomic History   Marital status: Significant Other    Spouse name: Not on file   Number of children: Not on file   Years  of education: Not on file   Highest education level: Bachelor's degree (e.g., BA, AB, BS)  Occupational History   Not on file  Tobacco Use   Smoking status: Never   Smokeless tobacco: Never  Vaping Use   Vaping status: Never Used  Substance and Sexual Activity   Alcohol use: Yes    Comment: rarely   Drug use: No   Sexual activity: Never    Birth control/protection: None  Other Topics Concern   Not on file  Social History Narrative   Not on file   Social Drivers of Health   Financial Resource Strain: Low Risk  (08/17/2023)   Overall Financial Resource Strain (CARDIA)     Difficulty of Paying Living Expenses: Not very hard  Food Insecurity: No Food Insecurity (08/17/2023)   Hunger Vital Sign    Worried About Running Out of Food in the Last Year: Never true    Ran Out of Food in the Last Year: Never true  Transportation Needs: No Transportation Needs (08/17/2023)   PRAPARE - Administrator, Civil Service (Medical): No    Lack of Transportation (Non-Medical): No  Physical Activity: Unknown (08/17/2023)   Exercise Vital Sign    Days of Exercise per Week: 0 days    Minutes of Exercise per Session: Not on file  Stress: Stress Concern Present (08/17/2023)   [employer] of Occupational Health - Occupational Stress Questionnaire    Feeling of Stress : To some extent  Social Connections: Unknown (08/17/2023)   Social Connection and Isolation Panel [NHANES]    Frequency of Communication with Friends and Family: More than three times a week    Frequency of Social Gatherings with Friends and Family: Once a week    Attends Religious Services: Patient declined    Active Member of Clubs or Organizations: No    Attends Engineer, structural: Not on file    Marital Status: Patient declined  Intimate Partner Violence: Not At Risk (08/18/2023)   Humiliation, Afraid, Rape, and Kick questionnaire    Fear of Current or Ex-Partner: No    Emotionally Abused: No    Physically Abused: No    Sexually Abused: No   Past Surgical History:  Procedure Laterality Date   BIOPSY  08/01/2020   Procedure: BIOPSY;  Surgeon: Malissa Hippo, MD;  Location: [facility];  Service: Endoscopy;;  rectal polyps times 2   COLONOSCOPY N/A 11/17/2012   Procedure: COLONOSCOPY;  Surgeon: Malissa Hippo, MD;  Location: [facility];  Service: Endoscopy;  Laterality: N/A;  1200-moved to1030 Ann to notify pt   COLONOSCOPY WITH PROPOFOL N/A 08/01/2020   Procedure: COLONOSCOPY WITH PROPOFOL;  Surgeon: Malissa Hippo, MD;  Location: [facility];  Service: Endoscopy;  Laterality:  N/A;  8:30   LAPAROSCOPIC SIGMOID COLECTOMY N/A 02/03/2013   Procedure: LAPAROSCOPIC SIGMOID COLECTOMY;  Surgeon: Atilano Ina, MD;  Location: [facility];  Service: General;  Laterality: N/A;   MOUTH SURGERY     wisdom teeth removed   PROCTOSCOPY  02/03/2013   Procedure: PROCTOSCOPY;  Surgeon: Atilano Ina, MD;  Location: [facility];  Service: General;;   Family History  Problem Relation Age of Onset   Colon polyps Mother    Diabetes Mother    COPD Mother    Colon polyps Other    Hodgkin's lymphoma Father    Cancer Maternal Aunt        breast   Cancer Maternal Grandmother  breast   Colon cancer Neg Hx     Current Outpatient Medications:    atenolol (TENORMIN) 25 MG tablet, Take 1 tablet (25 mg total) by mouth daily., Disp: 30 tablet, Rfl: 0   EPINEPHrine 0.3 mg/0.3 mL IJ SOAJ injection, Inject 0.3 mg into the muscle as needed for anaphylaxis. (Patient taking differently: Inject 0.3 mg into the muscle as needed for anaphylaxis. Needs refill), Disp: 1 each, Rfl: 0   famotidine (PEPCID) 20 MG tablet, Take 1 tablet (20 mg total) by mouth 2 (two) times daily., Disp: 10 tablet, Rfl: 0   hyoscyamine (LEVBID) 0.375 MG 12 hr tablet, Take 1 tablet (0.375 mg total) by mouth 2 (two) times daily as needed., Disp: 60 tablet, Rfl: 1   naproxen (NAPROSYN) 500 MG tablet, TAKE 1 TABLET(500 MG) BY MOUTH TWICE DAILY WITH A MEAL, Disp: 30 tablet, Rfl: 0   valACYclovir (VALTREX) 1000 MG tablet, Take 1 tablet (1,000 mg total) by mouth 2 (two) times daily. (Patient taking differently: Take 1,000 mg by mouth 2 (two) times daily. As needed), Disp: 20 tablet, Rfl: 5   amLODipine (NORVASC) 5 MG tablet, Take 5 mg by mouth daily. (Patient not taking: Reported on 08/18/2023), Disp: , Rfl:   Allergies  Allergen Reactions   Cinnamon Anaphylaxis   Elemental Sulfur Hives   Codeine Hives and Swelling   Penicillins Hives   Red Dye #40 (Allura Red) Hives   Ciprofloxacin Rash     ROS: Review of Systems ROS  As per  HPI   Physical exam    08/18/2023    1:29 PM 08/18/2023    1:24 PM 07/20/2023   12:42 PM  Vitals with BMI  Height  5' 6.5"   Weight  298 lbs   BMI  47.38   Systolic 142 143 578  Diastolic 80 80 94  Pulse  90 87    Physical Exam Constitutional:      General: She is awake. She is not in acute distress.    Appearance: Normal appearance. She is well-developed and well-groomed. She is morbidly obese. She is not ill-appearing, toxic-appearing or diaphoretic.     Interventions: She is not intubated. HENT:     Head:     Salivary Glands: Right salivary gland is not diffusely enlarged or tender. Left salivary gland is not diffusely enlarged or tender.     Right Ear: No laceration, drainage, swelling or tenderness. No middle ear effusion. There is no impacted cerumen. No foreign body. No mastoid tenderness. No PE tube. No hemotympanum. Tympanic membrane is not injected, scarred, perforated, erythematous, retracted or bulging.     Left Ear: No laceration, drainage, swelling or tenderness.  No middle ear effusion. There is no impacted cerumen. No foreign body. No mastoid tenderness. No PE tube. No hemotympanum. Tympanic membrane is not injected, scarred, perforated, erythematous, retracted or bulging.     Nose: No nasal deformity, septal deviation, signs of injury, laceration, nasal tenderness, mucosal edema, congestion or rhinorrhea.  Eyes:     General: Lids are normal.     Extraocular Movements:     Right eye: Normal extraocular motion.     Left eye: Normal extraocular motion.     Conjunctiva/sclera:     Right eye: Right conjunctiva is not injected. No chemosis, exudate or hemorrhage.    Left eye: Left conjunctiva is not injected. No chemosis, exudate or hemorrhage.    Pupils: Pupils are equal, round, and reactive to light. Pupils are equal.  Right eye: Pupil is round, reactive and not sluggish. No corneal abrasion.     Left eye: Pupil is round, reactive and not sluggish. No corneal  abrasion.     Funduscopic exam:    Right eye: No hemorrhage or exudate. Red reflex present.        Left eye: No hemorrhage or exudate. Red reflex present. Neck:     Thyroid: No thyroid mass or thyromegaly.     Vascular: No carotid bruit.     Trachea: Trachea and phonation normal. No tracheal tenderness, tracheostomy or tracheal deviation.  Cardiovascular:     Rate and Rhythm: Normal rate and regular rhythm.     Pulses: Normal pulses.          Radial pulses are 2+ on the right side and 2+ on the left side.       Posterior tibial pulses are 2+ on the right side and 2+ on the left side.     Heart sounds: Normal heart sounds. No murmur heard.    No gallop.  Pulmonary:     Effort: Pulmonary effort is normal. No tachypnea, bradypnea, accessory muscle usage, prolonged expiration, respiratory distress or retractions. She is not intubated.     Breath sounds: Normal breath sounds. No stridor, decreased air movement or transmitted upper airway sounds. No decreased breath sounds, wheezing, rhonchi or rales.  Abdominal:     General: Abdomen is flat. Bowel sounds are normal. There is no distension or abdominal bruit. There are no signs of injury.     Palpations: Abdomen is soft. There is no shifting dullness, fluid wave, hepatomegaly, splenomegaly, mass or pulsatile mass.     Tenderness: There is abdominal tenderness in the left upper quadrant. There is no right CVA tenderness, left CVA tenderness, guarding or rebound.     Hernia: No hernia is present.  Musculoskeletal:     Cervical back: Full passive range of motion without pain, normal range of motion and neck supple. No edema, erythema, rigidity, torticollis or crepitus. No pain with movement. Normal range of motion.     Right lower leg: No edema.     Left lower leg: No edema.  Lymphadenopathy:     Head:     Right side of head: No submental, submandibular, tonsillar, preauricular or posterior auricular adenopathy.     Left side of head: No  submental, submandibular, tonsillar, preauricular or posterior auricular adenopathy.     Cervical: No cervical adenopathy.     Right cervical: No superficial, deep or posterior cervical adenopathy.    Left cervical: No superficial, deep or posterior cervical adenopathy.  Skin:    General: Skin is warm.     Capillary Refill: Capillary refill takes less than 2 seconds.  Neurological:     General: No focal deficit present.     Mental Status: She is alert, oriented to person, place, and time and easily aroused. Mental status is at baseline.     GCS: GCS eye subscore is 4. GCS verbal subscore is 5. GCS motor subscore is 6.     Cranial Nerves: Cranial nerves 2-12 are intact. No cranial nerve deficit or facial asymmetry.     Motor: Motor function is intact. No weakness.     Gait: Gait is intact.  Psychiatric:        Attention and Perception: Attention and perception normal.        Mood and Affect: Mood and affect normal.        Speech: Speech normal.  Behavior: Behavior normal. Behavior is cooperative.        Thought Content: Thought content normal. Thought content does not include homicidal or suicidal ideation. Thought content does not include homicidal or suicidal plan.        Cognition and Memory: Cognition and memory normal.        Judgment: Judgment normal.        08/18/2023    1:31 PM  Depression screen PHQ 2/9  Decreased Interest 1  Down, Depressed, Hopeless 1  PHQ - 2 Score 2  Altered sleeping 3  Tired, decreased energy 3  Change in appetite 2  Feeling bad or failure about yourself  1  Trouble concentrating 0  Moving slowly or fidgety/restless 0  Suicidal thoughts 0  PHQ-9 Score 11  Difficult doing work/chores Somewhat difficult      08/18/2023    1:32 PM  GAD 7 : Generalized Anxiety Score  Nervous, Anxious, on Edge 2  Control/stop worrying 2  Worry too much - different things 2  Trouble relaxing 2  Restless 1  Easily annoyed or irritable 3  Afraid - awful  might happen 1  Total GAD 7 Score 13  Anxiety Difficulty Somewhat difficult   Assessment/ Plan: Reather Laurence here for annual physical exam.  1. Annual physical exam (Primary) Discussed with patient to continue healthy lifestyle choices, including diet (rich in fruits, vegetables, and lean proteins, and low in salt and simple carbohydrates) and exercise (at least 30 minutes of moderate physical activity daily). Limit beverages high is sugar. Recommended at least 80-100 oz of water daily.   2. Screening mammogram for breast cancer - MM 3D SCREENING MAMMOGRAM BILATERAL BREAST; Future  3. Encounter for screening for cardiovascular disorders Labs as below. Will communicate results to patient once available. Will await results to determine next steps.  Not fasting  - Lipid panel  4. Morbid obesity (HCC) Labs as below. Will communicate results to patient once available. Will await results to determine next steps.  - CBC with Differential/Platelet - CMP14+EGFR - TSH - VITAMIN D 25 Hydroxy (Vit-D Deficiency, Fractures) - Bayer DCA Hb A1c Waived  5. Anxiety Will start medication as below. Discussed BBW with patient. Patient to pursue counseling if she is not able to connect with provider, will let me know to provide referral.  Safety contract established today with patient in clinic. Denies intent to harm herself or others.  - sertraline (ZOLOFT) 25 MG tablet; Take 1 tablet (25 mg total) by mouth daily.  Dispense: 60 tablet; Refill: 0  6. Encounter for screening for depression Pt screened positive for depression today. Pt states that her symptoms are more anxiety. Would like to start medication as above. Safety contract established today with patient in clinic. Denies intent to harm herself or others.   7. Left upper quadrant abdominal pain Will trial PPI as below. Discussed with patient to follow up with GI. Reviewed notes from Carlan, NP 07/07/23 - pantoprazole (PROTONIX) 40 MG tablet;  Take 1 tablet (40 mg total) by mouth daily.  Dispense: 60 tablet; Refill: 0  8. Elevated blood pressure reading in office without diagnosis of hypertension Elevated BP today in office. Discussed with patient monitoring BP at home. Provided BP log to patient in clinic today. Instructed pt to take BP first thing in the morning after sitting for 5 minutes with feet flat on the floor, arm at heart level. Discussed with patient options for BP cuff at [place], [employer], [facility]. Pt  verbalized they were able to obtain BP cuff. Will review measurements with patient at follow up and determine plan for BP management.   9. Food allergy Will refill medication as below.  - EPINEPHrine 0.3 mg/0.3 mL IJ SOAJ injection; Inject 0.3 mg into the muscle as needed for anaphylaxis.  Dispense: 1 each; Refill: 0   Patient to follow up in 1 year for annual exam or sooner if needed.  The above assessment and management plan was discussed with the patient. The patient verbalized understanding of and has agreed to the management plan. Patient is aware to call the clinic if symptoms persist or worsen. Patient is aware when to return to the clinic for a follow-up visit. Patient educated on when it is appropriate to go to the emergency department.   Neale Burly, DNP-FNP [facility] [address] [place], [place] [address] [phone]

## 2023-08-18 NOTE — Patient Instructions (Signed)
Monitoring your BP at home   Your BP was elevated today in office  Please keep a log of your BP at home.  The best time to take BP is 1st thing in the morning after waking.  Sit for 5 minutes with feet flat on the floor, arm at heart level.  Options for BP cuffs are at [employer], [employer], [facility], [facility].  We will review measurements at follow up and determine plan for BP management.  If you have access, you can send a message in MyChart with your measurements prior to your follow up appointment.  The brand I recommend to get is Omron (Bronze).   Send measurements in 2 weeks.

## 2023-08-19 ENCOUNTER — Encounter: Payer: Self-pay | Admitting: Family Medicine

## 2023-08-19 DIAGNOSIS — E559 Vitamin D deficiency, unspecified: Secondary | ICD-10-CM | POA: Insufficient documentation

## 2023-08-19 DIAGNOSIS — R7303 Prediabetes: Secondary | ICD-10-CM | POA: Insufficient documentation

## 2023-08-19 LAB — CMP14+EGFR
ALT: 16 IU/L (ref 0–32)
AST: 15 IU/L (ref 0–40)
Albumin: 4.2 g/dL (ref 3.9–4.9)
Alkaline Phosphatase: 62 IU/L (ref 44–121)
BUN/Creatinine Ratio: 15 (ref 9–23)
BUN: 9 mg/dL (ref 6–24)
Bilirubin Total: 0.3 mg/dL (ref 0.0–1.2)
CO2: 24 mmol/L (ref 20–29)
Calcium: 9.3 mg/dL (ref 8.7–10.2)
Chloride: 101 mmol/L (ref 96–106)
Creatinine, Ser: 0.62 mg/dL (ref 0.57–1.00)
Globulin, Total: 2.3 g/dL (ref 1.5–4.5)
Glucose: 93 mg/dL (ref 70–99)
Potassium: 4.2 mmol/L (ref 3.5–5.2)
Sodium: 141 mmol/L (ref 134–144)
Total Protein: 6.5 g/dL (ref 6.0–8.5)
eGFR: 111 mL/min/{1.73_m2} (ref >59)

## 2023-08-19 LAB — CBC WITH DIFFERENTIAL/PLATELET
Basophils Absolute: 0.1 10*3/uL (ref 0.0–0.2)
Basos: 1 %
EOS (ABSOLUTE): 0.3 10*3/uL (ref 0.0–0.4)
Eos: 4 %
Hematocrit: 42.5 % (ref 34.0–46.6)
Hemoglobin: 14 g/dL (ref 11.1–15.9)
Immature Grans (Abs): 0 10*3/uL (ref 0.0–0.1)
Immature Granulocytes: 0 %
Lymphocytes Absolute: 2.3 10*3/uL (ref 0.7–3.1)
Lymphs: 26 %
MCH: 29.4 pg (ref 26.6–33.0)
MCHC: 32.9 g/dL (ref 31.5–35.7)
MCV: 89 fL (ref 79–97)
Monocytes Absolute: 0.7 10*3/uL (ref 0.1–0.9)
Monocytes: 8 %
Neutrophils Absolute: 5.7 10*3/uL (ref 1.4–7.0)
Neutrophils: 61 %
Platelets: 425 10*3/uL (ref 150–450)
RBC: 4.77 x10E6/uL (ref 3.77–5.28)
RDW: 13.2 % (ref 11.7–15.4)
WBC: 9.1 10*3/uL (ref 3.4–10.8)

## 2023-08-19 LAB — LIPID PANEL
Cholesterol, Total: 183 mg/dL (ref 100–199)
HDL: 50 mg/dL (ref >39)
LDL CALC COMMENT:: 3.7 ratio (ref 0.0–4.4)
LDL Chol Calc (NIH): 115 mg/dL — ABNORMAL HIGH (ref 0–99)
Triglycerides: 97 mg/dL (ref 0–149)
VLDL Cholesterol Cal: 18 mg/dL (ref 5–40)

## 2023-08-19 LAB — TSH: TSH: 0.802 u[IU]/mL (ref 0.450–4.500)

## 2023-08-19 LAB — VITAMIN D 25 HYDROXY (VIT D DEFICIENCY, FRACTURES): Vit D, 25-Hydroxy: 8.4 ng/mL — ABNORMAL LOW (ref 30.0–100.0)

## 2023-08-19 MED ORDER — VITAMIN D (ERGOCALCIFEROL) 1.25 MG (50000 UNIT) PO CAPS: 50000.0000 [IU] | ORAL_CAPSULE | ORAL | 0 refills | Status: AC

## 2023-08-19 NOTE — Addendum Note (Signed)
Addended by: Neale Burly on: 08/19/2023 03:37 PM   Modules accepted: Orders

## 2023-08-19 NOTE — Progress Notes (Signed)
Slightly elevated cholesterol. Recommend avoiding fried/fatty foods. The 10-year ASCVD risk score (Arnett DK, et al., 2019) is: 1.1%. Therefore, do not recommend starting a statin at this time  Vitamin D level is low. I have sent in a weekly supplement to take for the next 12 weeks. After that, take a daily OTC vitamin D supplement with 1000-2000 IU. A1C is in prediabetes range. Recommendhealthy lifestyle choices, including diet (rich in fruits, vegetables, and lean proteins, and low in salt and simple carbohydrates) and exercise (at least 30 minutes of moderate physical activity daily). Limit beverages high is sugar. Recommend at least 80-100 oz of water daily.  If not improved in 3 months, can consider metformin.  All other labs normal.

## 2023-10-14 ENCOUNTER — Other Ambulatory Visit: Payer: Self-pay | Admitting: Family Medicine

## 2023-10-14 DIAGNOSIS — F419 Anxiety disorder, unspecified: Secondary | ICD-10-CM

## 2023-10-30 ENCOUNTER — Encounter: Payer: Self-pay | Admitting: Family Medicine

## 2023-10-30 ENCOUNTER — Other Ambulatory Visit: Payer: Self-pay | Admitting: Family Medicine

## 2023-10-30 MED ORDER — VALACYCLOVIR HCL 1 G PO TABS: 2000.0000 mg | ORAL_TABLET | Freq: Two times a day (BID) | ORAL | 2 refills | Status: AC

## 2023-10-30 NOTE — Telephone Encounter (Unsigned)
 Copied from CRM [id number]. Topic: Clinical - Medication Refill >> Oct 30, 2023  8:51 AM Marlow Baars wrote: Most Recent Primary Care Visit: 08/18/23  Medication: valACYclovir (VALTREX) 1000 MG tablet  Has the patient contacted their pharmacy? Yes   Is this the correct pharmacy for this prescription? Yes If no, delete pharmacy and type the correct one.  This is the patient's preferred pharmacy:  [facility] [phone] - [place], [place] - [address] AT [facility] [address] [place] [address] Phone: [phone] Fax: [phone]   Has the prescription been filled recently? No  Is the patient out of the medication? Yes  Has the patient been seen for an appointment in the last year OR does the patient have an upcoming appointment? Yes  Can we respond through MyChart? Yes  Please assist patient further

## 2023-11-20 ENCOUNTER — Other Ambulatory Visit: Payer: Self-pay | Admitting: Family Medicine

## 2023-12-02 ENCOUNTER — Other Ambulatory Visit: Payer: Self-pay | Admitting: Family Medicine

## 2023-12-02 DIAGNOSIS — F419 Anxiety disorder, unspecified: Secondary | ICD-10-CM

## 2023-12-03 MED ORDER — SERTRALINE HCL 25 MG PO TABS: 25.0000 mg | ORAL_TABLET | Freq: Every day | ORAL | 0 refills | Status: DC

## 2024-02-05 ENCOUNTER — Other Ambulatory Visit: Payer: Self-pay | Admitting: *Deleted

## 2024-02-05 DIAGNOSIS — F419 Anxiety disorder, unspecified: Secondary | ICD-10-CM

## 2024-02-05 MED ORDER — SERTRALINE HCL 25 MG PO TABS
25.0000 mg | ORAL_TABLET | Freq: Every day | ORAL | 0 refills | Status: DC
Start: 2024-02-05 — End: 2024-04-26

## 2024-02-19 ENCOUNTER — Other Ambulatory Visit: Payer: Self-pay | Admitting: *Deleted

## 2024-02-19 MED ORDER — ATENOLOL 25 MG PO TABS: 25.0000 mg | ORAL_TABLET | Freq: Every day | ORAL | 1 refills | Status: DC | PRN

## 2024-03-22 ENCOUNTER — Telehealth: Payer: Self-pay

## 2024-03-22 ENCOUNTER — Ambulatory Visit (HOSPITAL_COMMUNITY)
Admission: RE | Admit: 2024-03-22 | Discharge: 2024-03-22 | Disposition: A | Discharge status: Home / Self Care | Source: Ambulatory Visit | Attending: Nurse Practitioner | Admitting: Nurse Practitioner

## 2024-03-22 ENCOUNTER — Ambulatory Visit
Admission: EM | Admit: 2024-03-22 | Discharge: 2024-03-22 | Disposition: A | Discharge status: Home / Self Care | Attending: Nurse Practitioner | Admitting: Nurse Practitioner

## 2024-03-22 DIAGNOSIS — M25561 Pain in right knee: Secondary | ICD-10-CM

## 2024-03-22 DIAGNOSIS — M1711 Unilateral primary osteoarthritis, right knee: Secondary | ICD-10-CM | POA: Insufficient documentation

## 2024-03-22 DIAGNOSIS — G8929 Other chronic pain: Secondary | ICD-10-CM

## 2024-03-22 MED ORDER — PREDNISONE 20 MG PO TABS
40.0000 mg | ORAL_TABLET | Freq: Every day | ORAL | 0 refills | Status: AC
Start: 2024-03-22 — End: 2024-03-27

## 2024-03-22 MED ORDER — NAPROXEN 500 MG PO TABS: 500.0000 mg | ORAL_TABLET | Freq: Two times a day (BID) | ORAL | 0 refills | Status: DC

## 2024-03-22 MED ORDER — METHOCARBAMOL 500 MG PO TABS: 500.0000 mg | ORAL_TABLET | Freq: Two times a day (BID) | ORAL | 0 refills | Status: DC

## 2024-03-22 MED ORDER — DEXAMETHASONE SODIUM PHOSPHATE 10 MG/ML IJ SOLN
10.0000 mg | INTRAMUSCULAR | Status: AC
  Administered 2024-03-22: 10 mg via INTRAMUSCULAR

## 2024-03-22 NOTE — ED Triage Notes (Signed)
 Pt reports right knee feels like a pressure towards the back of the knee, was unable to straighten the leg completely , felt like someone was pulling on muscles in the leg. Has hx of knee problems.

## 2024-03-22 NOTE — ED Provider Notes (Signed)
 [facility]    CSN: [id number] Arrival date & time: 03/22/24  1541      History   Chief Complaint No chief complaint on file.   HPI Virginia Barrett is a 48 y.o. female.   The history is provided by the patient.   Patient presents for complaints of right knee pain.  Patient reports history of chronic right knee pain.  She states over the past 2 days, the pain has worsened.  Patient complains of pain in the lateral aspect of the knee and behind the knee.  She states that she feels like there is a pressure towards the back of the knee.  Patient states that she also feels like there is a pulling on the muscles in her leg.  Patient denies recent injury or trauma.  States that she has seen orthopedics in the past for both of her knees.  Patient reports that there was some wear-and-tear on the cartilage in her knee that was noticed on an MRI along with fluid, last MRI was in 2016.  States that she is concerned that she may have more fluid on her knee.  Patient states that she did take naproxen  and methocarbamol  which seemed to help the feeling of the pressure and pulling in her leg.  Today, she is wearing Kinesiotape to the right knee.  Patient denies numbness, tingling, or the inability to bear weight.  She states that when she does sit for long periods of time, she has to stand before she starts walking.  She reports that though the knee does feel as if it will give out from time to time.  Past Medical History:  Diagnosis Date   Acute frontal sinusitis, unspecified    Bronchitis, not specified as acute or chronic    Constipation    Diarrhea    Disappearance and death of family member    Diverticulitis    Diverticulitis of small intestine without perforation or abscess without bleeding    Dizziness and giddiness    Headache(784.0)    occasional    Insomnia, unspecified    Lumbago    Obesity, unspecified    Pain in right knee    Pneumonia    hx of walking pneumonia     Precancerous skin lesion    Sinus infection    diagnosed on 01/28/13     Patient Active Problem List   Diagnosis Date Noted   Vitamin D  deficiency 08/19/2023   Prediabetes 08/19/2023   History of diverticulitis 07/10/2020   History of colonic polyps 07/10/2020   Diverticulitis of small intestine without perforation or abscess without bleeding    Insomnia, unspecified    Acute frontal sinusitis, unspecified    Obesity, unspecified    Pain in right knee    Diverticulitis     Past Surgical History:  Procedure Laterality Date   BIOPSY  08/01/2020   Procedure: BIOPSY;  Surgeon: Golda Claudis PENNER, MD;  Location: [facility];  Service: Endoscopy;;  rectal polyps times 2   COLONOSCOPY N/A 11/17/2012   Procedure: COLONOSCOPY;  Surgeon: Claudis PENNER Golda, MD;  Location: [facility];  Service: Endoscopy;  Laterality: N/A;  1200-moved to1030 Ann to notify pt   COLONOSCOPY WITH PROPOFOL  N/A 08/01/2020   Procedure: COLONOSCOPY WITH PROPOFOL ;  Surgeon: Golda Claudis PENNER, MD;  Location: [facility];  Service: Endoscopy;  Laterality: N/A;  8:30   LAPAROSCOPIC SIGMOID COLECTOMY N/A 02/03/2013   Procedure: LAPAROSCOPIC SIGMOID COLECTOMY;  Surgeon: Camellia CHRISTELLA  Tanda, MD;  Location: [facility];  Service: General;  Laterality: N/A;   MOUTH SURGERY     wisdom teeth removed   PROCTOSCOPY  02/03/2013   Procedure: PROCTOSCOPY;  Surgeon: Camellia CHRISTELLA Tanda, MD;  Location: [facility];  Service: General;;    OB History   No obstetric history on file.      Home Medications    Prior to Admission medications   Medication Sig Start Date End Date Taking? Authorizing Provider  amLODipine  (NORVASC ) 5 MG tablet Take 5 mg by mouth daily. Patient not taking: Reported on 08/18/2023    [provider]  atenolol  (TENORMIN ) 25 MG tablet Take 1 tablet (25 mg total) by mouth daily as needed. 02/19/24   Dettinger, Fonda LABOR, MD  EPINEPHrine  0.3 mg/0.3 mL IJ SOAJ injection Inject 0.3 mg into the muscle as needed for anaphylaxis.  08/18/23   Milian, Marry Lenis, FNP  famotidine  (PEPCID ) 20 MG tablet Take 1 tablet (20 mg total) by mouth 2 (two) times daily. 06/18/20   Wurst, [place], PA-C  hyoscyamine  (LEVBID ) 0.375 MG 12 hr tablet Take 1 tablet (0.375 mg total) by mouth 2 (two) times daily as needed. 07/07/23   Carlan, Chelsea L, NP  pantoprazole  (PROTONIX ) 40 MG tablet Take 1 tablet (40 mg total) by mouth daily. 08/18/23   Milian, Marry Lenis, FNP  sertraline  (ZOLOFT ) 25 MG tablet Take 1 tablet (25 mg total) by mouth daily. 02/05/24 04/05/24  Severa Rock CHRISTELLA, FNP  diphenhydrAMINE  (BENADRYL ) 25 MG tablet Take 1 tablet (25 mg total) by mouth every 6 (six) hours. 11/14/18 09/27/19  Zackowski, Scott, MD    Family History Family History  Problem Relation Age of Onset   Colon polyps Mother    Diabetes Mother    COPD Mother    Colon polyps Other    Hodgkin's lymphoma Father    Cancer Maternal Aunt        breast   Cancer Maternal Grandmother        breast   Colon cancer Neg Hx     Social History Social History   Tobacco Use   Smoking status: Never   Smokeless tobacco: Never  Vaping Use   Vaping status: Never Used  Substance Use Topics   Alcohol use: Yes    Comment: rarely   Drug use: No     Allergies   Cinnamon, Elemental sulfur, Codeine, Penicillins, Red dye #40 (allura red), and Ciprofloxacin    Review of Systems Review of Systems Per HPI  Physical Exam Triage Vital Signs ED Triage Vitals  Encounter Vitals Group     BP 03/22/24 1550 (!) 158/83     Girls Systolic BP Percentile --      Girls Diastolic BP Percentile --      Boys Systolic BP Percentile --      Boys Diastolic BP Percentile --      Pulse Rate 03/22/24 1550 78     Resp 03/22/24 1550 18     Temp 03/22/24 1550 98.3 F (36.8 C)     Temp Source 03/22/24 1550 Oral     SpO2 03/22/24 1550 96 %     Weight --      Height --      Head Circumference --      Peak Flow --      Pain Score 03/22/24 1555 7     Pain Loc --      Pain  Education --      Exclude from [employer]  Chart --    No data found.  Updated Vital Signs BP (!) 158/83 (BP Location: Right Arm)   Pulse 78   Temp 98.3 F (36.8 C) (Oral)   Resp 18   LMP 02/29/2024 (Within Days)   SpO2 96% Comment: room air  Visual Acuity Right Eye Distance:   Left Eye Distance:   Bilateral Distance:    Right Eye Near:   Left Eye Near:    Bilateral Near:     Physical Exam Vitals and nursing note reviewed.  Constitutional:      General: She is not in acute distress.    Appearance: Normal appearance.  HENT:     Head: Normocephalic.  Eyes:     Extraocular Movements: Extraocular movements intact.     Pupils: Pupils are equal, round, and reactive to light.  Pulmonary:     Effort: Pulmonary effort is normal.  Musculoskeletal:     Cervical back: Normal range of motion.     Right knee: Crepitus present. No swelling or deformity. Decreased range of motion. Tenderness present over the lateral joint line and PCL. Normal pulse.  Skin:    General: Skin is warm and dry.  Neurological:     General: No focal deficit present.     Mental Status: She is alert and oriented to person, place, and time.  Psychiatric:        Mood and Affect: Mood normal.        Behavior: Behavior normal.      UC Treatments / Results  Labs (all labs ordered are listed, but only abnormal results are displayed) Labs Reviewed - No data to display  EKG   Radiology No results found.  Procedures Procedures (including critical care time)  Medications Ordered in UC Medications - No data to display  Initial Impression / Assessment and Plan / UC Course  I have reviewed the triage vital signs and the nursing notes.  Pertinent labs & imaging results that were available during my care of the patient were reviewed by me and considered in my medical decision making (see chart for details).  X-ray of the right knee is pending.  Decadron  10 mg administered for inflammation.  Will start  patient on prednisone  40 mg for the next 5 days, methocarbamol  for knee pain, and naproxen  for pain.  Patient advised to start the naproxen  after she completes the prednisone .  Supportive care recommendations were provided and discussed with the patient to include continuing RICE therapy, also recommended use of an Ace wrap to allow for additional compression and support.  Patient was advised that due to her new symptoms, recommend follow-up with orthopedics for further evaluation.  Patient was in agreement with this plan of care and verbalizes understanding.  All questions were answered.  Patient stable for discharge.  Final Clinical Impressions(s) / UC Diagnoses   Final diagnoses:  None   Discharge Instructions   None    ED Prescriptions   None    PDMP not reviewed this encounter.   Gilmer Etta PARAS, NP 03/22/24 1627

## 2024-03-22 NOTE — Telephone Encounter (Signed)
 Pt has been made aware of x-ray results, verbalized understanding, pt had not questions at this time.

## 2024-03-22 NOTE — Discharge Instructions (Signed)
 You will need to go to any [facility] for an x-ray of your knee.  Please go to the main entrance of the hospital to get to the radiology department.  You will be contacted when the results of the x-ray are received.  You will also have access to your results via MyChart. You were given an injection of Decadron  10 mg.  Start the prednisone  tomorrow.  Do not take any additional NSAIDs this evening, you may take Tylenol  for breakthrough pain or discomfort. Take medication as prescribed.  Do not take any additional NSAIDs such as ibuprofen, Aleve , naproxen , Motrin, or Advil while you are taking the prednisone . RICE therapy, rest, ice, compression, and elevation.  Apply ice for 20 minutes, remove for 1 hour, repeat as needed.  Also recommend use of the Ace wrap when you are engaged in prolonged or strenuous activity. As discussed, I do recommend that you follow-up with Dr. Areatha office for further evaluation. Follow-up as needed.

## 2024-03-25 ENCOUNTER — Ambulatory Visit (HOSPITAL_COMMUNITY): Payer: Self-pay

## 2024-04-07 ENCOUNTER — Other Ambulatory Visit: Payer: Self-pay | Admitting: Family Medicine

## 2024-04-07 ENCOUNTER — Encounter: Payer: Self-pay | Admitting: General Practice

## 2024-04-07 DIAGNOSIS — F419 Anxiety disorder, unspecified: Secondary | ICD-10-CM

## 2024-04-07 NOTE — Telephone Encounter (Signed)
 Letter sent

## 2024-04-07 NOTE — Telephone Encounter (Signed)
 Marry pt NTBS by new provider NO RF sent to pharmacy

## 2024-04-25 ENCOUNTER — Other Ambulatory Visit: Payer: Self-pay | Admitting: Nurse Practitioner

## 2024-04-25 DIAGNOSIS — F419 Anxiety disorder, unspecified: Secondary | ICD-10-CM

## 2024-04-25 NOTE — Telephone Encounter (Unsigned)
 Copied from CRM #[id number]. Topic: Clinical - Medication Refill >> Apr 25, 2024 12:39 PM Yolanda T wrote: Medication: atenolol  (TENORMIN ) 25 MG tablet and sertraline  (ZOLOFT ) 25 MG tablet  Has the patient contacted their pharmacy? No  This is the patient's preferred pharmacy:  [facility] [phone] - [place], [place] - [address] AT [facility] [address] [place] [address] Phone: [phone] Fax: [phone]  Is this the correct pharmacy for this prescription? Yes  Has the prescription been filled recently? Yes  Is the patient out of the medication? Yes  Has the patient been seen for an appointment in the last year OR does the patient have an upcoming appointment? Yes  Can we respond through MyChart? Yes  Agent: Please be advised that Rx refills may take up to 3 business days. We ask that you follow-up with your pharmacy.

## 2024-04-26 ENCOUNTER — Encounter: Payer: Self-pay | Admitting: Nurse Practitioner

## 2024-04-26 ENCOUNTER — Ambulatory Visit (INDEPENDENT_AMBULATORY_CARE_PROVIDER_SITE_OTHER): Admitting: Nurse Practitioner

## 2024-04-26 VITALS — BP 148/84 | HR 68 | Temp 98.0°F | Ht 66.5 in | Wt 307.0 lb

## 2024-04-26 DIAGNOSIS — Z1231 Encounter for screening mammogram for malignant neoplasm of breast: Secondary | ICD-10-CM | POA: Diagnosis not present

## 2024-04-26 DIAGNOSIS — I10 Essential (primary) hypertension: Secondary | ICD-10-CM | POA: Diagnosis not present

## 2024-04-26 DIAGNOSIS — Z6841 Body Mass Index (BMI) 40.0 and over, adult: Secondary | ICD-10-CM

## 2024-04-26 DIAGNOSIS — E559 Vitamin D deficiency, unspecified: Secondary | ICD-10-CM

## 2024-04-26 DIAGNOSIS — Z0001 Encounter for general adult medical examination with abnormal findings: Secondary | ICD-10-CM | POA: Diagnosis not present

## 2024-04-26 DIAGNOSIS — R7303 Prediabetes: Secondary | ICD-10-CM | POA: Diagnosis not present

## 2024-04-26 DIAGNOSIS — E66813 Obesity, class 3: Secondary | ICD-10-CM | POA: Diagnosis not present

## 2024-04-26 LAB — BAYER DCA HB A1C WAIVED: HB A1C (BAYER DCA - WAIVED): 6 % — ABNORMAL HIGH (ref 4.8–5.6)

## 2024-04-26 LAB — LIPID PANEL

## 2024-04-26 MED ORDER — BUPROPION HCL ER (XL) 150 MG PO TB24: 150.0000 mg | ORAL_TABLET | Freq: Every day | ORAL | 1 refills | Status: DC

## 2024-04-26 MED ORDER — ATENOLOL 25 MG PO TABS: 25.0000 mg | ORAL_TABLET | Freq: Every day | ORAL | 1 refills | Status: DC | PRN

## 2024-04-26 MED ORDER — LISINOPRIL 10 MG PO TABS: 10.0000 mg | ORAL_TABLET | Freq: Every day | ORAL | 3 refills | Status: DC

## 2024-04-26 MED ORDER — METFORMIN HCL 500 MG PO TABS: 500.0000 mg | ORAL_TABLET | Freq: Two times a day (BID) | ORAL | 3 refills | Status: DC

## 2024-04-26 NOTE — Progress Notes (Signed)
 Subjective:  Patient ID: Virginia Barrett, female    DOB: August 10, 1975, 48 y.o.   MRN: [id number]  Patient Care Team: Patient, No Pcp Per as PCP - General (General Practice)   Chief Complaint:  Medical Management of Chronic Issues (Re est care) and Establish Care   HPI: Virginia Barrett is a 48 y.o. female presenting on 04/26/2024 for Medical Management of Chronic Issues (Re est care) and Establish Care   Discussed the use of AI scribe software for clinical note transcription with the patient, who gave verbal consent to proceed.  History of Present Illness Virginia Barrett is a 48 year old female who presents to establish care and for medication refills.  She experienced an episode of dizziness, nausea, and irregular heartbeats after walking uphill from a wedding venue. She felt clammy and very dizzy, with her heart beating irregularly, which was unusual for her. She had eaten three hours prior to the event. The symptoms resolved after resting and drinking water , although the nausea persisted for a while. She has a history of occasional heart palpitations and takes atenolol  daily for this.  She has been taking sertraline  for anxiety since January but ran out of the medication two days ago. Her anxiety has improved, but her depression remains unchanged. She feels less motivated and less excited about activities. She has gained seven pounds since her last visit, which she attributes partly to a knee injury that limits her physical activity. She is agreeable to trying Wellbutrin and having Zoloft  d/c despite dye allergy  I have been fine eating food with Red dye  She is not currently taking amlodipine  and pantoprazole , which were prescribed at urgent care. She has not experienced heartburn recently.  She was previously identified as prediabetic with an A1c of 6.0 in January. She is not currently taking any medication for this condition. She has a history of low vitamin D  levels, with a  previous measurement of 8.4 ng/mL in January, and has not been taking vitamin D  supplements since June.  She mentions a rash when consuming large amounts of red dye, but it is not severe. She works at [employer] and has a supportive boyfriend who has also been managing prediabetes. She is agreeable to trying Metformin for weight loss       04/26/2024   11:10 AM 08/18/2023    1:31 PM  PHQ9 SCORE ONLY  PHQ-9 Total Score 11 11      Data saved with a previous flowsheet row definition       04/26/2024   11:10 AM 08/18/2023    1:32 PM  GAD 7 : Generalized Anxiety Score  Nervous, Anxious, on Edge 1 2  Control/stop worrying 1 2  Worry too much - different things 1 2  Trouble relaxing 1 2  Restless 0 1  Easily annoyed or irritable 0 3  Afraid - awful might happen 1 1  Total GAD 7 Score 5 13  Anxiety Difficulty Not difficult at all Somewhat difficult      Relevant past medical, surgical, family, and social history reviewed and updated as indicated.  Allergies and medications reviewed and updated. Data reviewed: Chart in Epic.   Past Medical History:  Diagnosis Date   Acute frontal sinusitis, unspecified    Bronchitis, not specified as acute or chronic    Constipation    Diarrhea    Disappearance and death of family member    Diverticulitis    Diverticulitis of  small intestine without perforation or abscess without bleeding    Dizziness and giddiness    Headache(784.0)    occasional    Insomnia, unspecified    Lumbago    Obesity, unspecified    Pain in right knee    Pneumonia    hx of walking pneumonia    Precancerous skin lesion    Sinus infection    diagnosed on 01/28/13     Past Surgical History:  Procedure Laterality Date   BIOPSY  08/01/2020   Procedure: BIOPSY;  Surgeon: Golda Claudis PENNER, MD;  Location: [facility];  Service: Endoscopy;;  rectal polyps times 2   COLONOSCOPY N/A 11/17/2012   Procedure: COLONOSCOPY;  Surgeon: Claudis PENNER Golda, MD;   Location: [facility];  Service: Endoscopy;  Laterality: N/A;  1200-moved to1030 Ann to notify pt   COLONOSCOPY WITH PROPOFOL  N/A 08/01/2020   Procedure: COLONOSCOPY WITH PROPOFOL ;  Surgeon: Golda Claudis PENNER, MD;  Location: [facility];  Service: Endoscopy;  Laterality: N/A;  8:30   LAPAROSCOPIC SIGMOID COLECTOMY N/A 02/03/2013   Procedure: LAPAROSCOPIC SIGMOID COLECTOMY;  Surgeon: Camellia CHRISTELLA Blush, MD;  Location: [facility];  Service: General;  Laterality: N/A;   MOUTH SURGERY     wisdom teeth removed   PROCTOSCOPY  02/03/2013   Procedure: PROCTOSCOPY;  Surgeon: Camellia CHRISTELLA Blush, MD;  Location: [facility];  Service: General;;    Social History   Socioeconomic History   Marital status: Significant Other    Spouse name: Not on file   Number of children: Not on file   Years of education: Not on file   Highest education level: Bachelor's degree (e.g., BA, AB, BS)  Occupational History   Not on file  Tobacco Use   Smoking status: Never   Smokeless tobacco: Never  Vaping Use   Vaping status: Never Used  Substance and Sexual Activity   Alcohol use: Yes    Comment: rarely   Drug use: No   Sexual activity: Never    Birth control/protection: None  Other Topics Concern   Not on file  Social History Narrative   Not on file   Social Drivers of Health   Financial Resource Strain: Low Risk  (04/25/2024)   Overall Financial Resource Strain (CARDIA)    Difficulty of Paying Living Expenses: Not hard at all  Food Insecurity: No Food Insecurity (04/25/2024)   Hunger Vital Sign    Worried About Running Out of Food in the Last Year: Never true    Ran Out of Food in the Last Year: Never true  Transportation Needs: No Transportation Needs (04/25/2024)   PRAPARE - Administrator, Civil Service (Medical): No    Lack of Transportation (Non-Medical): No  Physical Activity: Inactive (04/25/2024)   Exercise Vital Sign    Days of Exercise per Week: 0 days    Minutes of Exercise per Session: Not on  file  Stress: Stress Concern Present (04/25/2024)   [employer] of Occupational Health - Occupational Stress Questionnaire    Feeling of Stress: Rather much  Social Connections: Unknown (04/25/2024)   Social Connection and Isolation Panel    Frequency of Communication with Friends and Family: More than three times a week    Frequency of Social Gatherings with Friends and Family: Twice a week    Attends Religious Services: Patient declined    Database administrator or Organizations: No    Attends Banker Meetings: Not on file  Marital Status: Patient declined  Intimate Partner Violence: Not At Risk (08/18/2023)   Humiliation, Afraid, Rape, and Kick questionnaire    Fear of Current or Ex-Partner: No    Emotionally Abused: No    Physically Abused: No    Sexually Abused: No    Outpatient Encounter Medications as of 04/26/2024  Medication Sig   buPROPion (WELLBUTRIN XL) 150 MG 24 hr tablet Take 1 tablet (150 mg total) by mouth daily.   cetirizine (ZYRTEC) 5 MG tablet Take 5 mg by mouth daily.   EPINEPHrine  0.3 mg/0.3 mL IJ SOAJ injection Inject 0.3 mg into the muscle as needed for anaphylaxis.   lisinopril (ZESTRIL) 10 MG tablet Take 1 tablet (10 mg total) by mouth daily.   metFORMIN (GLUCOPHAGE) 500 MG tablet Take 1 tablet (500 mg total) by mouth 2 (two) times daily with a meal.   methocarbamol  (ROBAXIN ) 500 MG tablet Take 1 tablet (500 mg total) by mouth 2 (two) times daily.   [DISCONTINUED] atenolol  (TENORMIN ) 25 MG tablet Take 1 tablet (25 mg total) by mouth daily as needed.   [DISCONTINUED] hyoscyamine  (LEVBID ) 0.375 MG 12 hr tablet Take 1 tablet (0.375 mg total) by mouth 2 (two) times daily as needed.   [DISCONTINUED] predniSONE  (DELTASONE ) 20 MG tablet Take 20 mg by mouth daily with breakfast.   [DISCONTINUED] sertraline  (ZOLOFT ) 25 MG tablet Take 1 tablet (25 mg total) by mouth daily.   atenolol  (TENORMIN ) 25 MG tablet Take 1 tablet (25 mg total) by mouth daily as  needed.   [DISCONTINUED] amLODipine  (NORVASC ) 5 MG tablet Take 5 mg by mouth daily. (Patient not taking: Reported on 08/18/2023)   [DISCONTINUED] diphenhydrAMINE  (BENADRYL ) 25 MG tablet Take 1 tablet (25 mg total) by mouth every 6 (six) hours.   [DISCONTINUED] famotidine  (PEPCID ) 20 MG tablet Take 1 tablet (20 mg total) by mouth 2 (two) times daily. (Patient not taking: Reported on 04/26/2024)   [DISCONTINUED] naproxen  (NAPROSYN ) 500 MG tablet Take 1 tablet (500 mg total) by mouth 2 (two) times daily. (Patient not taking: Reported on 04/26/2024)   [DISCONTINUED] pantoprazole  (PROTONIX ) 40 MG tablet Take 1 tablet (40 mg total) by mouth daily.   No facility-administered encounter medications on file as of 04/26/2024.    Allergies  Allergen Reactions   Cinnamon Anaphylaxis   Elemental Sulfur Hives   Codeine Hives and Swelling   Penicillins Hives   Red Dye #40 (Allura Red) Hives   Ciprofloxacin  Rash    Pertinent ROS per HPI, otherwise unremarkable      Objective:  BP (!) 148/84   Pulse 68   Temp 98 F (36.7 C) (Temporal)   Ht 5' 6.5 (1.689 m)   Wt (!) 307 lb (139.3 kg)   SpO2 98%   BMI 48.81 kg/m    Wt Readings from Last 3 Encounters:  04/26/24 (!) 307 lb (139.3 kg)  08/18/23 298 lb (135.2 kg)  07/07/23 (!) 303 lb 12.8 oz (137.8 kg)   BP Readings from Last 3 Encounters:  04/26/24 (!) 148/84  03/22/24 (!) 158/83  08/18/23 (!) 142/80    Physical Exam Vitals and nursing note reviewed.  Constitutional:      General: She is not in acute distress.    Appearance: She is obese.  HENT:     Head: Normocephalic and atraumatic.     Right Ear: Tympanic membrane, ear canal and external ear normal. There is no impacted cerumen.     Left Ear: Tympanic membrane, ear canal and external ear normal.  There is no impacted cerumen.     Nose: Nose normal.     Mouth/Throat:     Mouth: Mucous membranes are moist.  Eyes:     General: No scleral icterus.    Extraocular Movements: Extraocular  movements intact.     Conjunctiva/sclera: Conjunctivae normal.     Pupils: Pupils are equal, round, and reactive to light.  Neck:     Vascular: No carotid bruit.  Cardiovascular:     Heart sounds: Normal heart sounds.  Pulmonary:     Effort: Pulmonary effort is normal.     Breath sounds: Normal breath sounds.  Abdominal:     General: Bowel sounds are normal.     Palpations: Abdomen is soft.  Musculoskeletal:        General: Normal range of motion.     Cervical back: Normal range of motion and neck supple. No rigidity or tenderness.     Right lower leg: No edema.     Left lower leg: No edema.  Lymphadenopathy:     Cervical: No cervical adenopathy.  Skin:    General: Skin is warm and dry.     Findings: No rash.  Neurological:     Mental Status: She is alert and oriented to person, place, and time.  Psychiatric:        Mood and Affect: Mood normal.        Behavior: Behavior normal.        Thought Content: Thought content normal.        Judgment: Judgment normal.    Physical Exam VITALS: BP- 148/84 GENERAL: Well developed, well nourished, in no acute distress.     Results for orders placed or performed in visit on 08/18/23  Bayer DCA Hb A1c Waived   Collection Time: 08/18/23  2:10 PM  Result Value Ref Range   HB A1C (BAYER DCA - WAIVED) 6.0 (H) 4.8 - 5.6 %  CBC with Differential/Platelet   Collection Time: 08/18/23  2:11 PM  Result Value Ref Range   WBC 9.1 3.4 - 10.8 x10E3/uL   RBC 4.77 3.77 - 5.28 x10E6/uL   Hemoglobin 14.0 11.1 - 15.9 g/dL   Hematocrit 57.4 65.9 - 46.6 %   MCV 89 79 - 97 fL   MCH 29.4 26.6 - 33.0 pg   MCHC 32.9 31.5 - 35.7 g/dL   RDW 86.7 88.2 - 84.5 %   Platelets 425 150 - 450 x10E3/uL   Neutrophils 61 Not Estab. %   Lymphs 26 Not Estab. %   Monocytes 8 Not Estab. %   Eos 4 Not Estab. %   Basos 1 Not Estab. %   Neutrophils Absolute 5.7 1.4 - 7.0 x10E3/uL   Lymphocytes Absolute 2.3 0.7 - 3.1 x10E3/uL   Monocytes Absolute 0.7 0.1 - 0.9  x10E3/uL   EOS (ABSOLUTE) 0.3 0.0 - 0.4 x10E3/uL   Basophils Absolute 0.1 0.0 - 0.2 x10E3/uL   Immature Granulocytes 0 Not Estab. %   Immature Grans (Abs) 0.0 0.0 - 0.1 x10E3/uL  CMP14+EGFR   Collection Time: 08/18/23  2:11 PM  Result Value Ref Range   Glucose 93 70 - 99 mg/dL   BUN 9 6 - 24 mg/dL   Creatinine, Ser 9.37 0.57 - 1.00 mg/dL   eGFR 888 >40 fO/fpw/8.26   BUN/Creatinine Ratio 15 9 - 23   Sodium 141 134 - 144 mmol/L   Potassium 4.2 3.5 - 5.2 mmol/L   Chloride 101 96 - 106 mmol/L   CO2 24  20 - 29 mmol/L   Calcium 9.3 8.7 - 10.2 mg/dL   Total Protein 6.5 6.0 - 8.5 g/dL   Albumin 4.2 3.9 - 4.9 g/dL   Globulin, Total 2.3 1.5 - 4.5 g/dL   Bilirubin Total 0.3 0.0 - 1.2 mg/dL   Alkaline Phosphatase 62 44 - 121 IU/L   AST 15 0 - 40 IU/L   ALT 16 0 - 32 IU/L  Lipid panel   Collection Time: 08/18/23  2:11 PM  Result Value Ref Range   Cholesterol, Total 183 100 - 199 mg/dL   Triglycerides 97 0 - 149 mg/dL   HDL 50 >60 mg/dL   VLDL Cholesterol Cal 18 5 - 40 mg/dL   LDL Chol Calc (NIH) 884 (H) 0 - 99 mg/dL   Chol/HDL Ratio 3.7 0.0 - 4.4 ratio  TSH   Collection Time: 08/18/23  2:11 PM  Result Value Ref Range   TSH 0.802 0.450 - 4.500 uIU/mL  VITAMIN D  25 Hydroxy (Vit-D Deficiency, Fractures)   Collection Time: 08/18/23  2:11 PM  Result Value Ref Range   Vit D, 25-Hydroxy 8.4 (L) 30.0 - 100.0 ng/mL       Pertinent labs & imaging results that were available during my care of the patient were reviewed by me and considered in my medical decision making.  Assessment & Plan:  Virginia Barrett was seen today for medical management of chronic issues and establish care.  Diagnoses and all orders for this visit:  Prediabetes -     Bayer DCA Hb A1c Waived -     metFORMIN (GLUCOPHAGE) 500 MG tablet; Take 1 tablet (500 mg total) by mouth 2 (two) times daily with a meal.  Encounter for general adult medical examination with abnormal findings -     Cancel: Bayer DCA Hb A1c  Waived -     Comprehensive metabolic panel with GFR -     CBC with Differential/Platelet -     Lipid panel -     Thyroid Panel With TSH -     Bayer DCA Hb A1c Waived -     buPROPion (WELLBUTRIN XL) 150 MG 24 hr tablet; Take 1 tablet (150 mg total) by mouth daily. -     metFORMIN (GLUCOPHAGE) 500 MG tablet; Take 1 tablet (500 mg total) by mouth 2 (two) times daily with a meal. -     VITAMIN D  25 Hydroxy (Vit-D Deficiency, Fractures)  Vitamin D  deficiency -     VITAMIN D  25 Hydroxy (Vit-D Deficiency, Fractures)  Class 3 severe obesity without serious comorbidity with body mass index (BMI) of 45.0 to 49.9 in adult, unspecified obesity type -     metFORMIN (GLUCOPHAGE) 500 MG tablet; Take 1 tablet (500 mg total) by mouth 2 (two) times daily with a meal.  Screening mammogram for breast cancer -     MM 3D SCREENING MAMMOGRAM BILATERAL BREAST  Primary hypertension -     lisinopril (ZESTRIL) 10 MG tablet; Take 1 tablet (10 mg total) by mouth daily.  Other orders -     atenolol  (TENORMIN ) 25 MG tablet; Take 1 tablet (25 mg total) by mouth daily as needed.    Virginia Barrett is a 48 year old Caucasian female seen today to establish care, no acute distress Assessment and Plan Assessment & Plan Adult Wellness Visit Symptoms during exertion possibly due to hypoglycemia, cardiovascular issues, or deconditioning. - Check A1c to rule out hypoglycemia. - Consider EKG if symptoms persist. -Refill provided for atenolol  Heart palpitations Intermittent  palpitations managed with atenolol . - Continue atenolol . - Consider further evaluation if symptoms recur.  Hypertension Chronic hypertension with recent elevated readings. Amlodipine  prescribed but not taken. - Add a blood pressure medication to the regimen.  Lisinopril 10 mg daily  Obesity Obesity contributing to hypertension, prediabetes, and knee pain. Discussed metformin for weight management. - Start metformin 500 mg twice a day with  food.  Prediabetes Prediabetes with A1c of 6.0%. Discussed metformin for weight loss and blood sugar control. - Order A1c test. - Start metformin 500 mg twice a day with food. - Monitor for GI side effects and adjust if necessary.  Knee pain Chronic knee pain affecting physical activity.  Depression and Anxiety Persistent depressive symptoms and weight gain. Discussed switching to Wellbutrin for its weight-neutral profile. - Discontinue Zoloft  with tapering if any remaining tablets. - Start Wellbutrin XL 150 mg daily for 30 days. - Monitor for allergic reaction due to dye allergy and adjust if necessary.  Vitamin D  deficiency Vitamin D  deficiency with level of 8.4 ng/mL. - Order vitamin D  level test. - Prescribe high-dose vitamin D  if levels remain low.   Labs: CBC, CMP, lipid, TSH, vitamin D , A1c result pending   Health maintenance: Mammogram ordered Continue all other maintenance medications.  Follow up plan: Return in about 6 weeks (around 06/07/2024) for medication dose titration.   Continue healthy lifestyle choices, including diet (rich in fruits, vegetables, and lean proteins, and low in salt and simple carbohydrates) and exercise (at least 30 minutes of moderate physical activity daily).  Educational handout given for    Clinical References  Obesity, Adult Obesity is having too much body fat. Being obese means that your weight is more than what is healthy for you.  BMI (body mass index) is a number that explains how much body fat you have. If you have a BMI of 30 or more, you are obese. Obesity can cause serious health problems, such as: Stroke. Coronary artery disease (CAD). Type 2 diabetes. Some types of cancer. High blood pressure (hypertension). High cholesterol. Gallbladder stones. Obesity can also contribute to: Osteoarthritis. Sleep apnea. Infertility problems. What are the causes? Eating meals each day that are high in calories, sugar, and  fat. Drinking a lot of drinks that have sugar in them. Being born with genes that may make you more likely to become obese. Having a medical condition that causes obesity. Taking certain medicines. Sitting a lot (having a sedentary lifestyle). Not getting enough sleep. What increases the risk? Having a family history of obesity. Living in an area with limited access to: [place], recreation centers, or sidewalks. Healthy food choices, such as grocery stores and farmers' markets. What are the signs or symptoms? The main sign is having too much body fat. How is this treated? Treatment for this condition often includes changing your lifestyle. Treatment may include: Changing your diet. This may include making a healthy meal plan. Exercise. This may include activity that causes your heart to beat faster (aerobic exercise) and strength training. Work with your doctor to design a program that works for you. Medicine to help you lose weight. This may be used if you are not able to lose one pound a week after 6 weeks of healthy eating and more exercise. Treating conditions that cause the obesity. Surgery. Options may include gastric banding and gastric bypass. This may be done if: Other treatments have not helped to improve your condition. You have a BMI of 40 or higher. You have life-threatening health  problems related to obesity. Follow these instructions at home: Eating and drinking  Follow advice from your doctor about what to eat and drink. Your doctor may tell you to: Limit fast food, sweets, and processed snack foods. Choose low-fat options. For example, choose low-fat milk instead of whole milk. Eat five or more servings of fruits or vegetables each day. Eat at home more often. This gives you more control over what you eat. Choose healthy foods when you eat out. Learn to read food labels. This will help you learn how much food is in one serving. Keep low-fat snacks available. Avoid  drinks that have a lot of sugar in them. These include soda, fruit juice, iced tea with sugar, and flavored milk. Drink enough water  to keep your pee (urine) pale yellow. Do not go on fad diets. Physical activity Exercise often, as told by your doctor. Most adults should get up to 150 minutes of moderate-intensity exercise every week.Ask your doctor: What types of exercise are safe for you. How often you should exercise. Warm up and stretch before being active. Do slow stretching after being active (cool down). Rest between times of being active. Lifestyle Work with your doctor and a food expert (dietitian) to set a weight-loss goal that is best for you. Limit your screen time. Find ways to reward yourself that do not involve food. Do not drink alcohol if: Your doctor tells you not to drink. You are pregnant, may be pregnant, or are planning to become pregnant. If you drink alcohol: Limit how much you have to: 0-1 drink a day for women. 0-2 drinks a day for men. Know how much alcohol is in your drink. In the [place] one drink equals one 12 oz bottle of beer (355 mL), one 5 oz glass of wine (148 mL), or one 1 oz glass of hard liquor (44 mL). General instructions Keep a weight-loss journal. This can help you keep track of: The food that you eat. How much exercise you get. Take over-the-counter and prescription medicines only as told by your doctor. Take vitamins and supplements only as told by your doctor. Think about joining a support group. Pay attention to your mental health as obesity can lead to depression or self esteem issues. Keep all follow-up visits. Contact a doctor if: You cannot meet your weight-loss goal after you have changed your diet and lifestyle for 6 weeks. You are having trouble breathing. Summary Obesity is having too much body fat. Being obese means that your weight is more than what is healthy for you. Work with your doctor to set a weight-loss goal. Get  regular exercise as told by your doctor. This information is not intended to replace advice given to you by your health care provider. Make sure you discuss any questions you have with your health care provider. Document Revised: 02/19/2021 Document Reviewed: 02/19/2021 Elsevier Patient Education  2024 [employer] Diabetes: Carbohydrate Counting for Adults Carbohydrate counting is a method of keeping track of how many carbohydrates you eat. Eating carbohydrates increases the amount of sugar, also called glucose, in your blood. By counting how many carbohydrates you eat, you can improve how well you manage your blood sugar. This, in turn, helps you manage your diabetes. Carbohydrates are measured in grams (g) per serving. It's important to know how many carbohydrates (in grams or by serving size) you can have in each meal. This is different for every person. A dietitian can help you make a meal plan and calculate how  many carbohydrates you should have at each meal and snack. What foods contain carbohydrates? Carbohydrates are found in these foods: Grains, such as breads and cereals. Dried beans and soy products. Starchy vegetables, such as potatoes, peas, and corn. Fruit and fruit juices. Milk and yogurt. Sweets and snack foods like cake, cookies, candy, chips, and soft drinks. How do I count carbohydrates in foods? There are two ways to count carbohydrates in food. You can read food labels or learn standard serving sizes of foods. You can use either of these methods or a combination of both. Using the Nutrition Facts label The Nutrition Facts list is included on the labels of almost all packaged foods and drinks in the [place] It includes: The serving size. Information about nutrients in each serving. This includes the grams of carbohydrate per serving. To use the Nutrition Facts, decide how many servings you will have. Then, multiply the number of servings by the number of carbohydrates per  serving. The resulting number is the total grams of carbohydrates that you'll be having. Learning the standard serving sizes of foods When you eat carbohydrate foods that aren't packaged or don't include Nutrition Facts on the label, you need to measure the servings in order to count the grams of carbohydrates. Measure the foods that you'll eat with a food scale or measuring cup, if needed. Decide how many standard-size servings you'll eat. Multiply the number of servings by 15. For foods that contain carbohydrates, one serving equals 15 g of carbohydrates. For example, if you eat 2 cups or 10 oz (300 g) of strawberries, you'll have eaten 2 servings and 30 g of carbohydrates (2 servings x 15 g = 30 g). For foods that have more than one food mixed, such as soups and casseroles, you must count the carbohydrates in each food that's included. Here's a list of standard serving sizes for common carbohydrate-rich foods. Each of these servings has about 15 g of carbohydrates: 1 slice of bread. 1 six-inch (15 cm) tortilla. ? cup or 2 oz (53 g) of cooked rice or pasta.  cup or 3 oz (85 g) of cooked or canned, drained, and rinsed beans or lentils.  cup or 3 oz (85 g) of a starchy vegetable, such as peas, corn, or squash.  cup or 4 oz (120 g) of hot cereal.  cup or 3 oz (85 g) of boiled or mashed potatoes, or  or 3 oz (85 g) of a large baked potato.  cup or 4 fl oz (118 mL) of fruit juice. 1 cup or 8 fl oz (237 mL) of milk. 1 small or 4 oz (106 g) apple.  or 2 oz (63 g) of a medium banana. 1 cup or 5 oz (150 g) of strawberries. 3 cups or 1 oz (28.3 g) of popped popcorn. What is an example of carbohydrate counting? To calculate the grams of carbohydrates in this sample meal, follow the steps below. Sample meal 3 oz (85 g) chicken breast. ? cup or 4 oz (106 g) of brown rice.  cup or 3 oz (85 g) of corn. 1 cup or 8 fl oz (237 mL) of milk. 1 cup or 5 oz (150 g) of strawberries with sugar-free  whipped topping. Carbohydrate calculation Identify the foods that have carbohydrates: Rice. Corn. Milk. Strawberries. Calculate how many servings you have of each food: 2 servings of rice. 1 serving of corn. 1 serving of milk. 1 serving of strawberries. Multiply each number of servings by 15 g: 2  servings of rice x 15 g = 30 g. 1 serving of corn x 15 g = 15 g. 1 serving of milk x 15 g = 15 g. 1 serving of strawberries x 15 g = 15 g. Add together all of the amounts to find the total grams of carbohydrates eaten: 30 g + 15 g + 15 g + 15 g = 75 g of carbohydrates total. Where to find more information To learn more, go to: American Diabetes Association at diabetes.org. Click Search and type carb counting. Find the link you need. Centers for Disease Control and Prevention at TonerPromos.no. Click Search and type diabetes. Find the link you need. Academy of Nutrition and Dietetics: eatright.org This information is not intended to replace advice given to you by your health care provider. Make sure you discuss any questions you have with your health care provider. Document Revised: 07/01/2023 Document Reviewed: 07/01/2023 Elsevier Patient Education  2025 [employer]. Diabetes: Healthy Eating for Adults When you have diabetes, also called diabetes mellitus, it's important to have healthy eating habits. Your blood sugar (glucose) levels are greatly affected by what you eat and drink. You need to eat healthy foods in the right amounts, at about the same times each day. Doing this can help you: Manage your blood sugar. Lower your risk of heart disease. Improve your blood pressure. Reach or stay at a healthy weight. What can affect my meal plan? Every person with diabetes is different. And each person has different needs for a meal plan. Your health care provider may suggest that you work with an expert in healthy eating called a dietitian. They can help you make a meal plan that's best  for you. How do carbohydrates affect me? Carbohydrates, also called carbs, affect your blood sugar level more than any other type of food. Eating carbs raises the amount of sugar in your blood. It's important to know how many carbs you can safely have in each meal. This is different for every person. Your dietitian can help you calculate how many carbs you should have at each meal and for each snack. How does alcohol affect me? Alcohol can cause a decrease in blood sugar (hypoglycemia), especially if you use insulin or take certain diabetes medicines by mouth. Hypoglycemia can be a life-threatening condition. Symptoms of hypoglycemia are similar to those of having too much alcohol. They include confusion, being sleepy, and feeling dizzy. Do not drink alcohol if: Your provider tells you not to drink. You're pregnant, may be pregnant, or plan to become pregnant. What are tips for following this plan? Reading food labels Start by checking the serving size on the Nutrition Facts label of packaged foods and drinks. The number of calories and the amount of carbs, fats, and other nutrients listed on the label are based on one serving of the item. Many items contain more than one serving per package. Check the total grams (g) of carbs in one serving. Check the number of grams of saturated fats and trans fats in one serving. Choose foods that have a low amount or none of these fats. Check the number of milligrams (mg) of salt (sodium) in one serving. Most people should limit their total sodium intake to less than 2,300 mg per day. Always check the nutrition information of foods labeled as low-fat or nonfat. These foods may be higher in added sugar or refined carbs and should be avoided. Talk to your dietitian to identify your daily goals for nutrients listed on the  label. Shopping Avoid buying canned, pre-made, or processed foods. These foods tend to be high in fat, sodium, and added sugar. Shop around  the outside edge of the grocery store. This is where you'll most often find fresh fruits and vegetables, bulk grains, fresh meats, and fresh dairy products. Cooking Use low-heat cooking methods, such as baking, instead of high-heat methods like deep frying. Cook using healthy oils, such as olive, canola, or sunflower oil. Avoid cooking with butter, cream, or high-fat meats. Meal planning  Eat meals and snacks regularly. Try to eat them at the same times every day. Avoid going too long without eating. Eat foods that are high in fiber, such as fresh fruits, vegetables, beans, and whole grains. Eat 4-6 oz (112-168 g) of lean protein each day, such as lean meat, chicken, fish, eggs, or tofu. One ounce (oz) (28 g) of lean protein is equal to: 1 oz (28 g) of meat, chicken, or fish. 1 egg.  cup (62 g) of tofu. Eat some foods each day that contain healthy fats, such as avocado, nuts, seeds, and fish. What foods should I eat? Fruits Berries. Apples. Oranges. Peaches. Apricots. Plums. Grapes. Mangoes. Papayas. Pomegranates. Kiwi. Cherries. Vegetables Leafy greens, including lettuce, spinach, kale, chard, collard greens, mustard greens, and cabbage. Beets. Cauliflower. Broccoli. Carrots. Green beans. Tomatoes. Peppers. Onions. Cucumbers. Brussels sprouts. Grains Whole grains, such as whole-wheat or whole-grain bread, crackers, tortillas, cereal, and pasta. Unsweetened oatmeal. Quinoa. Brown or wild rice. Meats and other proteins Seafood. Poultry without skin. Lean cuts of poultry and beef. Tofu. Nuts. Seeds. Dairy Low-fat or fat-free dairy products such as milk, yogurt, and cheese. The items listed above may not be all the foods and drinks you can have. Talk with a dietitian to learn more. What foods should I avoid? Fruits Fruits canned with syrup. Vegetables Canned vegetables. Frozen vegetables with butter or cream sauce. Grains Refined white flour and flour products such as bread, pasta,  snack foods, and cereals. Avoid all processed foods. Meats and other proteins Fatty cuts of meat. Poultry with skin. Breaded or fried meats. Processed meat. Avoid saturated fats. Dairy Full-fat yogurt, cheese, or milk. Beverages Sweetened drinks, such as soda or iced tea. The items listed above may not be all the foods and drinks you should avoid. Talk with a dietitian to learn more. Where to find more information: To learn more, go to: Academy of Nutrition and Dietetics at DeathPrevention.it. Click Search and type diabetes. Find the link you need. Centers for Disease Control and Prevention at TonerPromos.no. Click Search and type diabetes. Find the link you need. American Diabetes Association: diabetes.org/food-nutrition [employer] of Diabetes and Digestive and Kidney Diseases: StageSync.si This information is not intended to replace advice given to you by your health care provider. Make sure you discuss any questions you have with your health care provider. Document Revised: 07/02/2023 Document Reviewed: 07/02/2023 Elsevier Patient Education  2025 [employer]. Cancer Screening: Female A cancer screening is a test or exam that checks for cancer. Work with your health care provider to create a cancer screening schedule that protects your health. Who should have screening? All females should be considered for screening of certain cancers, including breast cancer, cervical cancer, colorectal cancer, endometrial cancer, lung cancer, and skin cancer. Your health care provider may recommend screenings for other types of cancer if: You have had cancer before. You have a family member with cancer. You have genes that could increase the risk of cancer. You have risk factors for  certain cancers, such as current or past use of tobacco products or being overweight. What are the benefits of screening? Cancer screening is done to look for cancer in the very early stages, before it spreads and  becomes harder to treat and before you would start to notice symptoms. Finding cancer early improves the chances of successful treatment. It may save your life. When should I be screened for cancer? When you should be screened for cancer depends on: Your age. Your medical history and your family's medical history. Certain lifestyle factors, such as smoking or other use of tobacco products. Environmental exposure, such as to asbestos. How is screening done? Breast cancer Breast cancer screening is done with a test that takes images of breast tissue (mammogram) using an X-ray machine. Here are some screening guidelines for females at average risk: When you are 52-1 years old, you should be given the choice to start having mammograms. When you are 20-64 years old, you should have a mammogram every year. You may start having mammograms before you are 47 years old if you have risk factors for breast cancer, such as having an immediate family member with breast cancer. At 69 years old or older, you should have a mammogram every 1-2 years for as long as you are in good health and have a life expectancy of 10 years or longer. It is important to know what your breasts look and feel like so you can report any changes to your health care provider.   Cervical cancer Cervical cancer screening is done with an HPV (human papillomavirus) test to identify the virus that causes cervical cancer. To perform the test, a health care provider takes a swab of cells from the lowest part of the uterus (cervix) during a pelvic exam. This test may be performed along with a Pap test. This testchecks for abnormalities in the cervix. All females at average risk should consider being screened for cervical cancer starting no later than 47 years old and continuing until 47 years old. Screening should not begin earlier than 47 years old. You will have tests every 3-5 years, depending on your results and the type of screening test.  Talk with your health care provider about which screening test is right for you and how often you should be screened. If you have had the HPV vaccine, you will still be screened for cervical cancer and follow normal screening recommendations. You do not need to be screened for cervical cancer if any of the following apply to you: You are older than 47 years old and you have had normal screening tests in the past 10 years with no serious cervical precancer or cancer in the last 25 years. Your cervix and uterus have been removed, and you have never had cervical cancer or abnormal cells that could become cancer (precancerous cells). Colorectal cancer Colorectal cancer screening looks for cancer or for growths called polyps that often form before cancer starts. Tests to look for cancer or polyps include: Colonoscopy or flexible sigmoidoscopy. For these procedures, a flexible tube with a small camera is inserted into the rectum. CT colonography. This test uses X-rays and contrast dye to check the colon for polyps. Tests to look for cancer in the stool (feces) include: Guaiac-based fecal occult blood test (FOBT). This test can find blood in stool. It can be done at home with a kit. Fecal immunochemical test (FIT). This test can find blood in stool. For this test, you will need to collect stool  samples at home. Stool DNA test. This test looks for blood in stool and any changes in DNA that can lead to colon cancer. For this test, you will need to collect a stool sample at home and send it to a lab. All adults should have screenings starting at 47 years old and continuing through 47 years old. For females 32-37 years old, the decision to be screened should be based on a person's preferences, life expectancy, overall health, and prior screening history. Your health care provider may recommend screening before 47 years old. You will have tests every 1-10 years, depending on your results and the type of screening  test. People at increased risk should start screening at an earlier age. Talk with your health care provider about which screening test is right for you and how often you should be screened. Endometrial cancer There is no standard screening test for endometrial cancer, and females at average risk do not routinely need to have this screening. Talk with your health care provider about whether screening is right for you. If it is, this screening is performed through: Endometrial tissue biopsy. This tests a sample of tissue taken from the lining of the uterus. Vaginal ultrasound. If you are at increased risk for endometrial cancer, you may need to have these tests more often than normal. You are at increased risk if: You have a family history of ovarian, uterine, or certain types of colon cancer. You are taking tamoxifen, a medicine used to treat breast cancer. If you have reached menopause, it is especially important to talk with your health care provider about any vaginal bleeding or spotting. Screening for endometrial cancer is not recommended for females who do not have symptoms of the cancer, such as vaginal bleeding. Lung cancer Lung cancer screening is done with a CT scan that looks for abnormal changes in the lungs. Discuss lung cancer screening with your health care provider if you are 19-35 years old and if any of the following apply to you: You currently smoke. You used to smoke heavily. You have a smoking history of 1 pack of cigarettes a day for 20 years or 2 packs a day for 10 years. You may need to be screened every year if you smoke heavily or if you used to smoke. Skin cancer Skin cancer screening is done by checking the skin for unusual moles or spots and any changes in existing moles. Your health care provider should check your skin for signs of skin cancer at every physical exam. You should check your skin every month and tell your health care provider right away if anything looks  unusual. Females with a higher-than-normal risk for skin cancer may want to see a skin specialist (dermatologist) for an annual body check. Where to find more information American Cancer Society: cancer.org Centers for Disease Control and Prevention: TonerPromos.no National Cancer Institute: cancer.gov U.S. Department of Health and Human Services: TravelLesson.ca Contact a health care provider if: You have concerns about any signs or symptoms of cancer. These may include: Skin problems. You may have: Moles of an unusual shape or color. Changes in existing moles. A sore on your skin that does not heal. Tiredness (fatigue) that does not go away. Losing weight without trying. Blood in your urine or stool. Problems with coughing or breathing. These may include: Coughing or trouble breathing that does not go away. Coughing up blood. Lumps or other changes in your breasts. Vaginal bleeding, spotting, or changes in your period. Frequent pain  or cramping in your abdomen. This information is not intended to replace advice given to you by your health care provider. Make sure you discuss any questions you have with your health care provider. Document Revised: 07/22/2022 Document Reviewed: 02/03/2022 Elsevier Patient Education  2024 [employer]  The above assessment and management plan was discussed with the patient. The patient verbalized understanding of and has agreed to the management plan. Patient is aware to call the clinic if they develop any new symptoms or if symptoms persist or worsen. Patient is aware when to return to the clinic for a follow-up visit. Patient educated on when it is appropriate to go to the emergency department.  Chandon Lazcano [place], DNP [facility] [address] [place], [place] [address] [phone]

## 2024-04-27 ENCOUNTER — Ambulatory Visit: Payer: Self-pay | Admitting: Nurse Practitioner

## 2024-04-27 LAB — COMPREHENSIVE METABOLIC PANEL WITH GFR
ALT: 17 IU/L (ref 0–32)
AST: 13 IU/L (ref 0–40)
Albumin: 3.8 g/dL — AB (ref 3.9–4.9)
Alkaline Phosphatase: 54 IU/L (ref 41–116)
BUN/Creatinine Ratio: 19 (ref 9–23)
BUN: 11 mg/dL (ref 6–24)
Bilirubin Total: 0.4 mg/dL (ref 0.0–1.2)
CO2: 23 mmol/L (ref 20–29)
Calcium: 8.9 mg/dL (ref 8.7–10.2)
Chloride: 102 mmol/L (ref 96–106)
Creatinine, Ser: 0.59 mg/dL (ref 0.57–1.00)
Globulin, Total: 2.3 g/dL (ref 1.5–4.5)
Glucose: 87 mg/dL (ref 70–99)
Potassium: 3.6 mmol/L (ref 3.5–5.2)
Sodium: 139 mmol/L (ref 134–144)
Total Protein: 6.1 g/dL (ref 6.0–8.5)
eGFR: 112 mL/min/1.73 (ref >59)

## 2024-04-27 LAB — THYROID PANEL WITH TSH
Free Thyroxine Index: 2.1 (ref 1.2–4.9)
T3 Uptake Ratio: 25 % (ref 24–39)
T4, Total: 8.3 ug/dL (ref 4.5–12.0)
TSH: 1.13 u[IU]/mL (ref 0.450–4.500)

## 2024-04-27 LAB — CBC WITH DIFFERENTIAL/PLATELET
Basophils Absolute: 0.1 x10E3/uL (ref 0.0–0.2)
Basos: 1 %
EOS (ABSOLUTE): 0.1 x10E3/uL (ref 0.0–0.4)
Eos: 1 %
Hematocrit: 40.4 % (ref 34.0–46.6)
Hemoglobin: 12.7 g/dL (ref 11.1–15.9)
Immature Grans (Abs): 0 x10E3/uL (ref 0.0–0.1)
Immature Granulocytes: 0 %
Lymphocytes Absolute: 3.2 x10E3/uL — ABNORMAL HIGH (ref 0.7–3.1)
Lymphs: 28 %
MCH: 29.1 pg (ref 26.6–33.0)
MCHC: 31.4 g/dL — ABNORMAL LOW (ref 31.5–35.7)
MCV: 93 fL (ref 79–97)
Monocytes Absolute: 0.8 x10E3/uL (ref 0.1–0.9)
Monocytes: 7 %
Neutrophils Absolute: 7.2 x10E3/uL — ABNORMAL HIGH (ref 1.4–7.0)
Neutrophils: 63 %
Platelets: 397 x10E3/uL (ref 150–450)
RBC: 4.36 x10E6/uL (ref 3.77–5.28)
RDW: 13.4 % (ref 11.7–15.4)
WBC: 11.4 x10E3/uL — ABNORMAL HIGH (ref 3.4–10.8)

## 2024-04-27 LAB — LIPID PANEL
Cholesterol, Total: 170 mg/dL (ref 100–199)
HDL: 51 mg/dL (ref >39)
LDL CALC COMMENT:: 3.3 ratio (ref 0.0–4.4)
LDL Chol Calc (NIH): 102 mg/dL — AB (ref 0–99)
Triglycerides: 94 mg/dL (ref 0–149)
VLDL Cholesterol Cal: 17 mg/dL (ref 5–40)

## 2024-04-27 LAB — VITAMIN D 25 HYDROXY (VIT D DEFICIENCY, FRACTURES): Vit D, 25-Hydroxy: 16.6 ng/mL — AB (ref 30.0–100.0)

## 2024-04-27 MED ORDER — VITAMIN D (ERGOCALCIFEROL) 1.25 MG (50000 UNIT) PO CAPS: 50000.0000 [IU] | ORAL_CAPSULE | ORAL | 1 refills | Status: DC

## 2024-05-09 ENCOUNTER — Ambulatory Visit
Admission: RE | Admit: 2024-05-09 | Discharge: 2024-05-09 | Disposition: A | Source: Ambulatory Visit | Attending: Nurse Practitioner | Admitting: Nurse Practitioner

## 2024-05-09 DIAGNOSIS — Z1231 Encounter for screening mammogram for malignant neoplasm of breast: Secondary | ICD-10-CM | POA: Diagnosis not present

## 2024-05-10 ENCOUNTER — Encounter

## 2024-05-30 ENCOUNTER — Encounter: Payer: Self-pay | Admitting: Radiology

## 2024-06-02 NOTE — Progress Notes (Signed)
 Subjective:  Patient ID: Virginia Barrett, female    DOB: 1975/11/22, 48 y.o.   MRN: [id number]  Patient Care Team: Deitra Morton Sebastian Nena, NP as PCP - General (Nurse Practitioner)   Chief Complaint:  Medical Management of Chronic Issues (6 week)   HPI: Virginia Barrett is a 48 y.o. female presenting on 06/07/2024 for Medical Management of Chronic Issues (6 week)   Discussed the use of AI scribe software for clinical note transcription with the patient, who gave verbal consent to proceed.  History of Present Illness Virginia Barrett is a 48 year old female with hypertension, MDD and knee pain who presents for medication management and follow-up.  Since switching to Wellbutrin, she experiences less irritability and worry, with improved energy levels. She has lost eight pounds and her boyfriend has noticed a positive change in her demeanor.  She experiences knee pain, particularly when standing for extended periods, which she believes will improve with weight loss. She has used naproxen . The pain sometimes limits her ability to walk long distances.  She is agreeable to Voltaren gel over-the-counter or Korene  She is taking atenolol  25 mg daily for heart rhythm issues, which she describes as a 'weird rhythm' that sometimes takes her breath away, particularly at night. She has a family history of heart issues and underwent a stress test and other cardiac evaluations at [facility] five years ago, which showed her arteries were in good condition despite her size. She has experienced anxiety related to these symptoms, including a panic attack two weeks ago.  She is also on metformin, which she notes has decreased her appetite. She is trying to eat more lean proteins and is cautious about her diet due to a history of diverticulosis. She continues to take vitamin D  supplements, as her levels have improved but remain below normal.  She mentions a past episode of diverticulitis in 2019, during  which an EKG showed an abnormal wave, leading to further cardiac evaluation.  She has history of vitamin D  deficiency currently on vitamin D  weekly dose 50,000 unit.  Vitamin D  was checked 04/26/2024 and it was 16.6      Relevant past medical, surgical, family, and social history reviewed and updated as indicated.  Allergies and medications reviewed and updated. Data reviewed: Chart in Epic.   Past Medical History:  Diagnosis Date   Acute frontal sinusitis, unspecified    Bronchitis, not specified as acute or chronic    Constipation    Diarrhea    Disappearance and death of family member    Diverticulitis    Diverticulitis of small intestine without perforation or abscess without bleeding    Dizziness and giddiness    Headache(784.0)    occasional    Insomnia, unspecified    Lumbago    Obesity, unspecified    Pain in right knee    Pneumonia    hx of walking pneumonia    Precancerous skin lesion    Sinus infection    diagnosed on 01/28/13     Past Surgical History:  Procedure Laterality Date   BIOPSY  08/01/2020   Procedure: BIOPSY;  Surgeon: Golda Claudis PENNER, MD;  Location: [facility];  Service: Endoscopy;;  rectal polyps times 2   COLONOSCOPY N/A 11/17/2012   Procedure: COLONOSCOPY;  Surgeon: Claudis PENNER Golda, MD;  Location: [facility];  Service: Endoscopy;  Laterality: N/A;  1200-moved to1030 Ann to notify pt   COLONOSCOPY WITH PROPOFOL  N/A 08/01/2020  Procedure: COLONOSCOPY WITH PROPOFOL ;  Surgeon: Golda Claudis PENNER, MD;  Location: [facility];  Service: Endoscopy;  Laterality: N/A;  8:30   LAPAROSCOPIC SIGMOID COLECTOMY N/A 02/03/2013   Procedure: LAPAROSCOPIC SIGMOID COLECTOMY;  Surgeon: Camellia CHRISTELLA Blush, MD;  Location: [facility];  Service: General;  Laterality: N/A;   MOUTH SURGERY     wisdom teeth removed   PROCTOSCOPY  02/03/2013   Procedure: PROCTOSCOPY;  Surgeon: Camellia CHRISTELLA Blush, MD;  Location: [facility];  Service: General;;    Social History   Socioeconomic History    Marital status: Significant Other    Spouse name: Not on file   Number of children: Not on file   Years of education: Not on file   Highest education level: Bachelor's degree (e.g., BA, AB, BS)  Occupational History   Not on file  Tobacco Use   Smoking status: Never   Smokeless tobacco: Never  Vaping Use   Vaping status: Never Used  Substance and Sexual Activity   Alcohol use: Yes    Comment: rarely   Drug use: No   Sexual activity: Never    Birth control/protection: None  Other Topics Concern   Not on file  Social History Narrative   Not on file   Social Drivers of Health   Financial Resource Strain: Low Risk  (06/07/2024)   Overall Financial Resource Strain (CARDIA)    Difficulty of Paying Living Expenses: Not hard at all  Food Insecurity: No Food Insecurity (06/07/2024)   Hunger Vital Sign    Worried About Running Out of Food in the Last Year: Never true    Ran Out of Food in the Last Year: Never true  Transportation Needs: No Transportation Needs (06/07/2024)   PRAPARE - Administrator, Civil Service (Medical): No    Lack of Transportation (Non-Medical): No  Physical Activity: Insufficiently Active (06/07/2024)   Exercise Vital Sign    Days of Exercise per Week: 1 day    Minutes of Exercise per Session: 20 min  Stress: No Stress Concern Present (06/07/2024)   [employer] of Occupational Health - Occupational Stress Questionnaire    Feeling of Stress: Only a little  Recent Concern: Stress - Stress Concern Present (04/25/2024)   [employer] of Occupational Health - Occupational Stress Questionnaire    Feeling of Stress: Rather much  Social Connections: Socially Isolated (06/07/2024)   Social Connection and Isolation Panel    Frequency of Communication with Friends and Family: Three times a week    Frequency of Social Gatherings with Friends and Family: Once a week    Attends Religious Services: Never    Database Administrator or  Organizations: No    Attends Engineer, Structural: Not on file    Marital Status: Never married  Intimate Partner Violence: Not At Risk (08/18/2023)   Humiliation, Afraid, Rape, and Kick questionnaire    Fear of Current or Ex-Partner: No    Emotionally Abused: No    Physically Abused: No    Sexually Abused: No    Outpatient Encounter Medications as of 06/07/2024  Medication Sig   EPINEPHrine  0.3 mg/0.3 mL IJ SOAJ injection Inject 0.3 mg into the muscle as needed for anaphylaxis.   lisinopril (ZESTRIL) 10 MG tablet Take 1 tablet (10 mg total) by mouth daily.   methocarbamol  (ROBAXIN ) 500 MG tablet Take 1 tablet (500 mg total) by mouth 2 (two) times daily.   [DISCONTINUED] atenolol  (TENORMIN ) 25 MG tablet Take 1 tablet (  25 mg total) by mouth daily as needed.   [DISCONTINUED] buPROPion (WELLBUTRIN XL) 150 MG 24 hr tablet Take 1 tablet (150 mg total) by mouth daily.   [DISCONTINUED] cetirizine (ZYRTEC) 5 MG tablet Take 5 mg by mouth daily.   [DISCONTINUED] metFORMIN (GLUCOPHAGE) 500 MG tablet Take 1 tablet (500 mg total) by mouth 2 (two) times daily with a meal.   [DISCONTINUED] Vitamin D , Ergocalciferol , (DRISDOL ) 1.25 MG (50000 UNIT) CAPS capsule Take 1 capsule (50,000 Units total) by mouth every 7 (seven) days.   atenolol  (TENORMIN ) 25 MG tablet Take 1 tablet (25 mg total) by mouth 2 (two) times daily.   buPROPion (WELLBUTRIN XL) 150 MG 24 hr tablet Take 1 tablet (150 mg total) by mouth daily.   cetirizine (ZYRTEC) 5 MG tablet Take 1 tablet (5 mg total) by mouth daily.   metFORMIN (GLUCOPHAGE) 500 MG tablet Take 1 tablet (500 mg total) by mouth 2 (two) times daily with a meal.   Vitamin D , Ergocalciferol , (DRISDOL ) 1.25 MG (50000 UNIT) CAPS capsule Take 1 capsule (50,000 Units total) by mouth every 7 (seven) days.   [DISCONTINUED] diphenhydrAMINE  (BENADRYL ) 25 MG tablet Take 1 tablet (25 mg total) by mouth every 6 (six) hours.   No facility-administered encounter medications on  file as of 06/07/2024.    Allergies  Allergen Reactions   Cinnamon Anaphylaxis   Elemental Sulfur Hives   Codeine Hives and Swelling   Penicillins Hives   Red Dye #40 (Allura Red) Hives   Ciprofloxacin  Rash   Review of Systems  Constitutional:  Negative for chills and fever.  HENT:  Negative for congestion and sore throat.   Gastrointestinal:  Negative for constipation, diarrhea, nausea and vomiting.  Musculoskeletal:  Positive for joint pain.       Bilateral knee pain  Skin:  Negative for itching and rash.  Neurological:  Negative for dizziness and headaches.  Psychiatric/Behavioral:  Negative for suicidal ideas. The patient is not nervous/anxious and does not have insomnia.          Objective:  BP 126/78   Pulse 76   Temp (!) 97 F (36.1 C) (Temporal)   Ht 5' 6.5 (1.689 m)   Wt 299 lb 6.4 oz (135.8 kg)   LMP 04/25/2024   SpO2 96%   BMI 47.60 kg/m    Wt Readings from Last 3 Encounters:  06/07/24 299 lb 6.4 oz (135.8 kg)  04/26/24 (!) 307 lb (139.3 kg)  08/18/23 298 lb (135.2 kg)   BP Readings from Last 3 Encounters:  06/07/24 126/78  04/26/24 (!) 148/84  03/22/24 (!) 158/83     Physical Exam Vitals reviewed.  Constitutional:      General: She is not in acute distress.    Appearance: She is obese.  HENT:     Head: Normocephalic and atraumatic.     Nose: Nose normal.     Mouth/Throat:     Mouth: Mucous membranes are moist.  Eyes:     General: No scleral icterus.    Extraocular Movements: Extraocular movements intact.     Conjunctiva/sclera: Conjunctivae normal.     Pupils: Pupils are equal, round, and reactive to light.  Cardiovascular:     Heart sounds: Normal heart sounds.  Pulmonary:     Effort: Pulmonary effort is normal.     Breath sounds: Normal breath sounds.  Abdominal:     General: Bowel sounds are normal.     Palpations: Abdomen is soft.  Musculoskeletal:  General: Normal range of motion.     Right knee: Normal range of motion.  Tenderness present.     Left knee: Normal range of motion.     Right lower leg: No edema.     Left lower leg: No edema.  Skin:    General: Skin is warm and dry.  Neurological:     Mental Status: She is oriented to person, place, and time.  Psychiatric:        Mood and Affect: Mood normal.        Behavior: Behavior normal.        Thought Content: Thought content normal.        Judgment: Judgment normal.    Physical Exam      Results for orders placed or performed in visit on 04/26/24  Bayer DCA Hb A1c Waived   Collection Time: 04/26/24 12:01 PM  Result Value Ref Range   HB A1C (BAYER DCA - WAIVED) 6.0 (H) 4.8 - 5.6 %  Comprehensive metabolic panel with GFR   Collection Time: 04/26/24 12:07 PM  Result Value Ref Range   Glucose 87 70 - 99 mg/dL   BUN 11 6 - 24 mg/dL   Creatinine, Ser 9.40 0.57 - 1.00 mg/dL   eGFR 887 >40 fO/fpw/8.26   BUN/Creatinine Ratio 19 9 - 23   Sodium 139 134 - 144 mmol/L   Potassium 3.6 3.5 - 5.2 mmol/L   Chloride 102 96 - 106 mmol/L   CO2 23 20 - 29 mmol/L   Calcium 8.9 8.7 - 10.2 mg/dL   Total Protein 6.1 6.0 - 8.5 g/dL   Albumin 3.8 (L) 3.9 - 4.9 g/dL   Globulin, Total 2.3 1.5 - 4.5 g/dL   Bilirubin Total 0.4 0.0 - 1.2 mg/dL   Alkaline Phosphatase 54 41 - 116 IU/L   AST 13 0 - 40 IU/L   ALT 17 0 - 32 IU/L  CBC with Differential/Platelet   Collection Time: 04/26/24 12:07 PM  Result Value Ref Range   WBC 11.4 (H) 3.4 - 10.8 x10E3/uL   RBC 4.36 3.77 - 5.28 x10E6/uL   Hemoglobin 12.7 11.1 - 15.9 g/dL   Hematocrit 59.5 65.9 - 46.6 %   MCV 93 79 - 97 fL   MCH 29.1 26.6 - 33.0 pg   MCHC 31.4 (L) 31.5 - 35.7 g/dL   RDW 86.5 88.2 - 84.5 %   Platelets 397 150 - 450 x10E3/uL   Neutrophils 63 Not Estab. %   Lymphs 28 Not Estab. %   Monocytes 7 Not Estab. %   Eos 1 Not Estab. %   Basos 1 Not Estab. %   Neutrophils Absolute 7.2 (H) 1.4 - 7.0 x10E3/uL   Lymphocytes Absolute 3.2 (H) 0.7 - 3.1 x10E3/uL   Monocytes Absolute 0.8 0.1 - 0.9 x10E3/uL    EOS (ABSOLUTE) 0.1 0.0 - 0.4 x10E3/uL   Basophils Absolute 0.1 0.0 - 0.2 x10E3/uL   Immature Granulocytes 0 Not Estab. %   Immature Grans (Abs) 0.0 0.0 - 0.1 x10E3/uL  Lipid panel   Collection Time: 04/26/24 12:07 PM  Result Value Ref Range   Cholesterol, Total 170 100 - 199 mg/dL   Triglycerides 94 0 - 149 mg/dL   HDL 51 >60 mg/dL   VLDL Cholesterol Cal 17 5 - 40 mg/dL   LDL Chol Calc (NIH) 897 (H) 0 - 99 mg/dL   Chol/HDL Ratio 3.3 0.0 - 4.4 ratio  Thyroid Panel With TSH   Collection Time: 04/26/24 12:07  PM  Result Value Ref Range   TSH 1.130 0.450 - 4.500 uIU/mL   T4, Total 8.3 4.5 - 12.0 ug/dL   T3 Uptake Ratio 25 24 - 39 %   Free Thyroxine Index 2.1 1.2 - 4.9  VITAMIN D  25 Hydroxy (Vit-D Deficiency, Fractures)   Collection Time: 04/26/24 12:07 PM  Result Value Ref Range   Vit D, 25-Hydroxy 16.6 (L) 30.0 - 100.0 ng/mL       Pertinent labs & imaging results that were available during my care of the patient were reviewed by me and considered in my medical decision making.  Assessment & Plan:  Virginia Barrett was seen today for medical management of chronic issues.  Diagnoses and all orders for this visit:  Primary hypertension  Moderate episode of recurrent major depressive disorder (HCC) -     buPROPion (WELLBUTRIN XL) 150 MG 24 hr tablet; Take 1 tablet (150 mg total) by mouth daily.  GAD (generalized anxiety disorder) -     atenolol  (TENORMIN ) 25 MG tablet; Take 1 tablet (25 mg total) by mouth 2 (two) times daily.  Vitamin D  deficiency -     Vitamin D , Ergocalciferol , (DRISDOL ) 1.25 MG (50000 UNIT) CAPS capsule; Take 1 capsule (50,000 Units total) by mouth every 7 (seven) days.  Prediabetes -     metFORMIN (GLUCOPHAGE) 500 MG tablet; Take 1 tablet (500 mg total) by mouth 2 (two) times daily with a meal.  Encounter for general adult medical examination with abnormal findings -     metFORMIN (GLUCOPHAGE) 500 MG tablet; Take 1 tablet (500 mg total) by mouth 2  (two) times daily with a meal. -     buPROPion (WELLBUTRIN XL) 150 MG 24 hr tablet; Take 1 tablet (150 mg total) by mouth daily.  Class 3 severe obesity without serious comorbidity with body mass index (BMI) of 45.0 to 49.9 in adult, unspecified obesity type (HCC) -     metFORMIN (GLUCOPHAGE) 500 MG tablet; Take 1 tablet (500 mg total) by mouth 2 (two) times daily with a meal.  Atypical chest pain -     atenolol  (TENORMIN ) 25 MG tablet; Take 1 tablet (25 mg total) by mouth 2 (two) times daily.  Other orders -     cetirizine (ZYRTEC) 5 MG tablet; Take 1 tablet (5 mg total) by mouth daily.     Assessment and Plan Browning is a 48 year old Caucasian female seen today for chronic disease management, no acute distress Assessment & Plan Adult Wellness Visit Routine wellness visit with emphasis on lifestyle changes. - Continue current lifestyle modifications with focus on lean protein intake and moderation in diet.  Primary hypertension Blood pressure managed with current medication. Reports heart palpitations and anxiety. - Continue current lisinopril 10 mg daily, refill provided. - Increased atenolol  to twice daily as needed for heart palpitations.  Depression and generalized anxiety disorder Improvement in mood and anxiety with Wellbutrin. Discussed potential dose adjustment during holidays. - Continue Wellbutrin XL 150 mg daily. - Consider increasing Wellbutrin to two tablets daily during holiday season if needed.  Heart palpitations Intermittent palpitations with anxiety. Previous cardiac evaluations normal. Atenolol  dosage increased. - Increased atenolol  to twice daily as needed for palpitations. - Advised to take atenolol  with her when going out.  Obesity, class 3 Weight loss of 8 pounds due to Wellbutrin and lifestyle changes. Discussed appetite suppression effects. - Continue metformin 500 mg twice daily and lifestyle modifications. - Encouraged lean protein intake and  moderation in diet.  Prediabetes  Managed with metformin. Discussed dietary changes for blood sugar control. - Continue metformin 500 mg twice daily with meals. - Encouraged dietary modifications focusing on lean proteins and moderation.  Vitamin D  deficiency Vitamin D  levels improved but still below normal. Plan to continue supplementation. - Continue vitamin D  supplementation for another three months. - Will switch to 2000 IU daily after three months.  Bilateral knee pain Chronic knee pain exacerbated by weight and standing. Previous use of naproxen  discussed alternative treatments. - Use Voltaren gel four times daily for knee pain relief.      Continue all other maintenance medications.  Follow up plan: Return in about 4 months (around 10/05/2024) for Chronic Diseases Mnagement.   Continue healthy lifestyle choices, including diet (rich in fruits, vegetables, and lean proteins, and low in salt and simple carbohydrates) and exercise (at least 30 minutes of moderate physical activity daily).  Educational handout given for    Clinical References  BMI for Adults Body mass index (BMI) is a number found using a person's weight and height. BMI can help tell how much of a person's weight is made up of fat. BMI does not measure body fat directly. It is used instead of tests that directly measure body fat, which can be difficult and expensive. What are BMI measurements used for? BMI is useful to: Find out if your weight puts you at higher risk for medical problems. Help recommend changes, such as in diet and exercise. This can help you reach a healthy weight. BMI screening can be done again to see if these changes are working. How is BMI calculated? Your height and weight are measured. The BMI is found from those numbers. This can be done with [place] or metric measurements. Note that charts and online BMI calculators are available to help you find your BMI quickly and easily without doing  these calculations. To calculate your BMI in [place] measurements: Measure your weight in pounds (lb). Multiply the number of pounds by 703. So, for an adult who weighs 150 lb, multiply that number by 703: 150 x 703, which equals 105,450. Measure your height in inches. Then multiply that number by itself to get a measurement called inches squared. So, for an adult who is 70 inches tall, the inches squared measurement is 70 inches x 70 inches, which equals 4,900 inches squared. Divide the total from step 2 (number of lb x 703) by the total from step 3 (inches squared): 105,450  4,900 = 21.5. This is your BMI. To calculate your BMI in metric measurements:  Measure your weight in kilograms (kg). For this example, the weight is 70 kg. Measure your height in meters (m). Then multiply that number by itself to get a measurement called meters squared. So, for an adult who is 1.75 m tall, the meters squared measurement is 1.75 m x 1.75 m, which equals 3.1 meters squared. Divide the number of kilograms (your weight) by the meters squared number. In this example: 70  3.1 = 22.6. This is your BMI. What do the results mean? BMI charts are used to see if you are underweight, normal weight, overweight, or obese. The following guidelines will be used: Underweight: BMI less than 18.5. Normal weight: BMI between 18.5 and 24.9. Overweight: BMI between 25 and 29.9. Obese: BMI of 30 or above. BMI is a tool and cannot diagnose a condition. Talk with your health care provider about what your BMI means for you. Keep these notes in mind: Weight includes fat and  muscle. Someone with a muscular build, such as an athlete, may have a BMI that is higher than 24.9. In cases like these, BMI is not a correct measure of body fat. If you have a BMI of 25 or higher, your provider may need to do more testing to find out if excess body fat is the cause. BMI is measured the same way for males and females. Females usually  have more body fat than males of the same height and weight. Where to find more information For more information about BMI, including tools to quickly find your BMI, go to: Centers for Disease Control and Prevention: tonerpromos.no American Heart Association: heart.org National Heart, Lung, and Blood Institute: buffalodrycleaner.gl This information is not intended to replace advice given to you by your health care provider. Make sure you discuss any questions you have with your health care provider. Document Revised: 04/03/2022 Document Reviewed: 03/27/2022 Elsevier Patient Education  2024 Elsevier Inc. Medical Screening Exam A medical screening exam (MSE) helps to determine whether you need immediate medical treatment relating to any number of symptoms you are having. This type of exam may be done in an emergency department, an urgent care setting, or your health care provider's office. Depending on your symptoms and severity, you may need additional tests or medical therapy. It is important to note that an MSE does not necessarily mean that you will need or receive further medical testing or interventions if your symptoms are not deemed to be medically urgent (emergent). Tell a health care provider about: Any allergies you have. All medicines you are taking, including vitamins, herbs, eye drops, creams, and over-the-counter medicines. Any problems you or family members have had with anesthetic medicines. Any bleeding problems you have. Any surgeries you have had. Any medical conditions you have. Whether you are pregnant or may be pregnant. What happens during the test? During the exam, a health care provider does a short, often focused, physical exam and asks about your medical history to assess: Your current symptoms. Your overall health. Your need for possible further medical intervention. What can I expect after the test? If you have a regular health care provider, make an appointment for a  follow-up visit with him or her. If you do not have a regular health care provider, ask about resources in your community. Your medical screening exam may determine that: You do not need emergency treatment at this time. You need treatment right away. You need to be transferred to another medical center. This may happen if you need an emergent specialist or consultant that is not available at the medical center you are at. You need to have more tests. A medical specialist may be consulted if needed. Get help right away if: Your condition gets worse. You develop new or troubling symptoms before you see your health care provider. These symptoms may represent a serious problem that is an emergency. Do not wait to see if the symptoms will go away. Get medical help right away. Call your local emergency services (911 in the [place] Do not drive yourself to the hospital. Summary A medical screening exam helps to determine whether you need medical treatment right away. This type of exam may be done in an emergency department, an urgent care setting, or your health care provider's office. During the exam, a health care provider does a short physical exam and asks about your current symptoms and overall health. Depending on the exam, more tests or therapies may be ordered. However, an  MSE does not necessarily mean that you will have further medical testing if your symptoms are not deemed to be urgent. If you need further care that is not offered at your current medical center, you may need to be transferred to another facility. This information is not intended to replace advice given to you by your health care provider. Make sure you discuss any questions you have with your health care provider. Document Revised: 03/27/2021 Document Reviewed: 11/22/2020 Elsevier Patient Education  2024 [employer] Inc. Managing Anxiety, Adult After being diagnosed with anxiety, you may be relieved to know why you have felt or  behaved a certain way. You may also feel overwhelmed about the treatment ahead and what it will mean for your life. With care and support, you can manage your anxiety. How to manage lifestyle changes Understanding the difference between stress and anxiety Although stress can play a role in anxiety, it is not the same as anxiety. Stress is your body's reaction to life changes and events, both good and bad. Stress is often caused by something external, such as a deadline, test, or competition. It normally goes away after the event has ended and will last just a few hours. But, stress can be ongoing and can lead to more than just stress. Anxiety is caused by something internal, such as imagining a terrible outcome or worrying that something will go wrong that will greatly upset you. Anxiety often does not go away even after the event is over, and it can become a long-term (chronic) worry. Lowering stress and anxiety Talk with your health care provider or a counselor to learn more about lowering anxiety and stress. They may suggest tension-reduction techniques, such as: Music. Spend time creating or listening to music that you enjoy and that inspires you. Mindfulness-based meditation. Practice being aware of your normal breaths while not trying to control your breathing. It can be done while sitting or walking. Centering prayer. Focus on a word, phrase, or sacred image that means something to you and brings you peace. Deep breathing. Expand your stomach and inhale slowly through your nose. Hold your breath for 3-5 seconds. Then breathe out slowly, letting your stomach muscles relax. Self-talk. Learn to notice and spot thought patterns that lead to anxiety reactions. Change those patterns to thoughts that feel peaceful. Muscle relaxation. Take time to tense muscles and then relax them. Choose a tension-reduction technique that fits your lifestyle and personality. These techniques take time and practice. Set  aside 5-15 minutes a day to do them. Specialized therapists can offer counseling and training in these techniques. The training to help with anxiety may be covered by some insurance plans. Other things you can do to manage stress and anxiety include: Keeping a stress diary. This can help you learn what triggers your reaction and then learn ways to manage your response. Thinking about how you react to certain situations. You may not be able to control everything, but you can control your response. Making time for activities that help you relax and not feeling guilty about spending your time in this way. Doing visual imagery. This involves imagining or creating mental pictures to help you relax. Practicing yoga. Through yoga poses, you can lower tension and relax.   Medicines Medicines for anxiety include: Antidepressant medicines. These are usually prescribed for long-term daily control. Anti-anxiety medicines. These may be added in severe cases, especially when panic attacks occur. When used together, medicines, psychotherapy, and tension-reduction techniques may be the most effective treatment.  Relationships Relationships can play a big part in helping you recover. Spend more time connecting with trusted friends and family members. Think about going to couples counseling if you have a partner, taking family education classes, or going to family therapy. Therapy can help you and others better understand your anxiety. How to recognize changes in your anxiety Everyone responds differently to treatment for anxiety. Recovery from anxiety happens when symptoms lessen and stop interfering with your daily life at home or work. This may mean that you will start to: Have better concentration and focus. Worry will interfere less in your daily thinking. Sleep better. Be less irritable. Have more energy. Have improved memory. Try to recognize when your condition is getting worse. Contact your provider if  your symptoms interfere with home or work and you feel like your condition is not improving. Follow these instructions at home: Activity Exercise. Adults should: Exercise for at least 150 minutes each week. The exercise should increase your heart rate and make you sweat (moderate-intensity exercise). Do strengthening exercises at least twice a week. Get the right amount and quality of sleep. Most adults need 7-9 hours of sleep each night. Lifestyle  Eat a healthy diet that includes plenty of vegetables, fruits, whole grains, low-fat dairy products, and lean protein. Do not eat a lot of foods that are high in fats, added sugars, or salt (sodium). Make choices that simplify your life. Do not use any products that contain nicotine or tobacco. These products include cigarettes, chewing tobacco, and vaping devices, such as e-cigarettes. If you need help quitting, ask your provider. Avoid caffeine, alcohol, and certain over-the-counter cold medicines. These may make you feel worse. Ask your pharmacist which medicines to avoid. General instructions Take over-the-counter and prescription medicines only as told by your provider. Keep all follow-up visits. This is to make sure you are managing your anxiety well or if you need more support. Where to find support You can get help and support from: Self-help groups. Online and [employer]. A trusted spiritual leader. Couples counseling. Family education classes. Family therapy. Where to find more information You may find that joining a support group helps you deal with your anxiety. The following sources can help you find counselors or support groups near you: Mental Health America: mentalhealthamerica.net Anxiety and Depression Association of America (ADAA): adaa.org [employer] Mental Illness (NAMI): nami.org Contact a health care provider if: You have a hard time staying focused or finishing tasks. You spend many hours a  day feeling worried about everyday life. You are very tired because you cannot stop worrying. You start to have headaches or often feel tense. You have chronic nausea or diarrhea. Get help right away if: Your heart feels like it is racing. You have shortness of breath. You have thoughts of hurting yourself or others. Get help right away if you feel like you may hurt yourself or others, or have thoughts about taking your own life. Go to your nearest emergency room or: Call 911. Call the National Suicide Prevention Lifeline at (650) 423-2101 or 988. This is open 24 hours a day. Text the Crisis Text Line at 838-305-2190. This information is not intended to replace advice given to you by your health care provider. Make sure you discuss any questions you have with your health care provider. Document Revised: 04/22/2022 Document Reviewed: 11/04/2020 Elsevier Patient Education  2024 [employer] Diabetes Action Plan A diabetes action plan is a way for you to manage your symptoms of diabetes, also  called diabetes mellitus. The plan is color-coded to guide you on what actions to take based on any symptoms you're having. If you have symptoms in the red zone, you need medical care right away. If you have symptoms in the yellow zone, your diabetes isn't under control, and you may need to make some changes. If you have symptoms in the green zone, you're doing well. Understanding diabetes can take time. Follow the treatment plan that you created with your health care provider. Know the target range for your blood sugar, also called glucose. Review your plan each time you visit your provider. The target range for my blood sugar level is __________________________ mg/dL. Red zone Get medical help right away if you have any of the following symptoms: A blood sugar test result that's below 54 mg/dL (3 mmol/L). A blood sugar test result that's at or above 240 mg/dL (86.6 mmol/L) for 2 days in a row along  with: Extreme thirst and frequent peeing. Confusion or trouble thinking clearly. Moderate or large ketone levels in your pee (urine). Feeling tired or having no energy. Trouble breathing. Sickness or a fever for 2 or more days that's not getting better. These symptoms may be an emergency. Call 911 right away. Do not wait to see if the symptoms will go away. Do not drive yourself to the hospital. If you have very low blood sugar, also called severe hypoglycemia, and you can't eat or drink, you may need glucagon. Make sure a family member or close friend knows how to check your blood sugar and how to give you glucagon. You may need to be treated in a hospital for this condition. Yellow zone If you have any of the following symptoms, your diabetes isn't under control, and you may need to make some changes: A blood sugar test result that's at or above 240 mg/dL (86.6 mmol/L) for 2 days in a row. Blood sugar test results that are below 70 mg/dL (3.9 mmol/L). Other symptoms of hypoglycemia, such as: Shaking or feeling light-headed. Confusion or irritability. Feeling hungry. Having a fast heartbeat. If you have any yellow zone symptoms: Treat your hypoglycemia by eating or drinking 15 grams of a rapid-acting carbohydrate. Follow the 15:15 rule: Take 15 grams of a rapid-acting carbohydrate, such as: 1 tube of glucose gel. 4 glucose pills. 4 oz (120 mL) of fruit juice. 4 oz (120 mL) of regular (not diet) soda. Check your blood sugar again 15 minutes after you take the carbohydrate. If the second blood sugar test is still at or below 70 mg/dL (3.9 mmol/L), take 15 grams of a carbohydrate again. If your blood sugar doesn't increase above 70 mg/dL (3.9 mmol/L) after 3 tries, get medical help right away. After your blood sugar returns to normal, eat a meal or a snack within 1 hour. Keep taking your daily medicines as told by your provider. Check your blood sugar more often than you normally  would. Write down your results. Call your provider if you have trouble keeping your blood sugar in your target range. Green zone These signs mean you're doing well and can continue what you're doing to manage your diabetes: Your blood sugar is within your personal target range. For most people, a blood sugar level before a meal should be 80-130 mg/dL (4.4-7.2 mmol/L). You feel well, and you're able to do daily activities. If you're in the green zone, continue to manage your diabetes as told by your provider. To do this: Eat a healthy diet. Exercise  regularly. Check your blood sugar as told. Take your medicines only as told. Where to find more information American Diabetes Association (ADA): diabetes.org Association of Diabetes Care & Education Specialists (ADCES): adces.org/diabetes-education-dsmes This information is not intended to replace advice given to you by your health care provider. Make sure you discuss any questions you have with your health care provider. Document Revised: 03/04/2023 Document Reviewed: 03/04/2023 Elsevier Patient Education  2024 [employer] Inc. Palpitations Palpitations are feelings that your heartbeat is irregular or is faster than normal. It may feel like your heart is fluttering or skipping a beat. Palpitations may be caused by many things, including smoking, caffeine, alcohol, stress, and certain medicines or drugs. Most causes of palpitations are not serious.  However, some palpitations can be a sign of a serious problem. Further tests and a thorough medical history will be done to find the cause of your palpitations. Your provider may order tests such as an ECG, labs, an echocardiogram, or an ambulatory continuous ECG monitor. Follow these instructions at home: Pay attention to any changes in your symptoms. Let your health care provider know about them. Take these actions to help manage your symptoms: Eating and drinking Follow instructions from your health  care provider about eating or drinking restrictions. You may need to avoid foods and drinks that may cause palpitations. These may include: Caffeinated coffee, tea, soft drinks, and energy drinks. Chocolate. Alcohol. Diet pills. Lifestyle     Take steps to reduce your stress and anxiety. Things that can help you relax include: Yoga. Mind-body activities, such as deep breathing, meditation, or using words and images to create positive thoughts (guided imagery). Physical activity, such as swimming, jogging, or walking. Tell your health care provider if your palpitations increase with activity. If you have chest pain or shortness of breath with activity, do not continue the activity until you are seen by your health care provider. Biofeedback. This is a method that helps you learn to use your mind to control things in your body, such as your heartbeat. Get plenty of rest and sleep. Keep a regular bed time. Do not use drugs, including cocaine or ecstasy. Do not use marijuana. Do not use any products that contain nicotine or tobacco. These products include cigarettes, chewing tobacco, and vaping devices, such as e-cigarettes. If you need help quitting, ask your health care provider. General instructions Take over-the-counter and prescription medicines only as told by your health care provider. Keep all follow-up visits. This is important. These may include visits for further testing if palpitations do not go away or get worse. Contact a health care provider if: You continue to have a fast or irregular heartbeat for a long period of time. You notice that your palpitations occur more often. Get help right away if: You have chest pain or shortness of breath. You have a severe headache. You feel dizzy or you faint. These symptoms may represent a serious problem that is an emergency. Do not wait to see if the symptoms will go away. Get medical help right away. Call your local emergency services (911  in the [place] Do not drive yourself to the hospital. Summary Palpitations are feelings that your heartbeat is irregular or is faster than normal. It may feel like your heart is fluttering or skipping a beat. Palpitations may be caused by many things, including smoking, caffeine, alcohol, stress, certain medicines, and drugs. Further tests and a thorough medical history may be done to find the cause of your palpitations. Get help  right away if you faint or have chest pain, shortness of breath, severe headache, or dizziness. This information is not intended to replace advice given to you by your health care provider. Make sure you discuss any questions you have with your health care provider. Document Revised: 12/05/2020 Document Reviewed: 12/05/2020 Elsevier Patient Education  2024 [employer] Inc.  The above assessment and management plan was discussed with the patient. The patient verbalized understanding of and has agreed to the management plan. Patient is aware to call the clinic if they develop any new symptoms or if symptoms persist or worsen. Patient is aware when to return to the clinic for a follow-up visit. Patient educated on when it is appropriate to go to the emergency department.   Azelie Noguera [place] Thompson, DNP [facility] [address] [place], [place] [address] [phone]

## 2024-06-07 ENCOUNTER — Ambulatory Visit (INDEPENDENT_AMBULATORY_CARE_PROVIDER_SITE_OTHER): Payer: Self-pay | Admitting: Nurse Practitioner

## 2024-06-07 ENCOUNTER — Encounter: Payer: Self-pay | Admitting: Nurse Practitioner

## 2024-06-07 VITALS — BP 126/78 | HR 76 | Temp 97.0°F | Ht 66.5 in | Wt 299.4 lb

## 2024-06-07 DIAGNOSIS — Z0001 Encounter for general adult medical examination with abnormal findings: Secondary | ICD-10-CM | POA: Diagnosis not present

## 2024-06-07 DIAGNOSIS — Z6841 Body Mass Index (BMI) 40.0 and over, adult: Secondary | ICD-10-CM | POA: Diagnosis not present

## 2024-06-07 DIAGNOSIS — I10 Essential (primary) hypertension: Secondary | ICD-10-CM

## 2024-06-07 DIAGNOSIS — F411 Generalized anxiety disorder: Secondary | ICD-10-CM

## 2024-06-07 DIAGNOSIS — R7303 Prediabetes: Secondary | ICD-10-CM | POA: Diagnosis not present

## 2024-06-07 DIAGNOSIS — F331 Major depressive disorder, recurrent, moderate: Secondary | ICD-10-CM | POA: Diagnosis not present

## 2024-06-07 DIAGNOSIS — R0789 Other chest pain: Secondary | ICD-10-CM | POA: Diagnosis not present

## 2024-06-07 DIAGNOSIS — E66813 Obesity, class 3: Secondary | ICD-10-CM | POA: Diagnosis not present

## 2024-06-07 DIAGNOSIS — E559 Vitamin D deficiency, unspecified: Secondary | ICD-10-CM | POA: Diagnosis not present

## 2024-06-07 MED ORDER — METFORMIN HCL 500 MG PO TABS: 500.0000 mg | ORAL_TABLET | Freq: Two times a day (BID) | ORAL | 1 refills | Status: AC

## 2024-06-07 MED ORDER — CETIRIZINE HCL 5 MG PO TABS: 5.0000 mg | ORAL_TABLET | Freq: Every day | ORAL | 0 refills | Status: DC

## 2024-06-07 MED ORDER — ATENOLOL 25 MG PO TABS: 25.0000 mg | ORAL_TABLET | Freq: Two times a day (BID) | ORAL | 1 refills | Status: AC

## 2024-06-07 MED ORDER — BUPROPION HCL ER (XL) 150 MG PO TB24: 150.0000 mg | ORAL_TABLET | Freq: Every day | ORAL | 1 refills | Status: DC

## 2024-06-07 MED ORDER — VITAMIN D (ERGOCALCIFEROL) 1.25 MG (50000 UNIT) PO CAPS: 50000.0000 [IU] | ORAL_CAPSULE | ORAL | 0 refills | Status: DC

## 2024-07-07 ENCOUNTER — Ambulatory Visit (INDEPENDENT_AMBULATORY_CARE_PROVIDER_SITE_OTHER): Payer: 59 | Admitting: Gastroenterology

## 2024-08-08 ENCOUNTER — Encounter: Payer: Self-pay | Admitting: *Deleted

## 2024-08-17 ENCOUNTER — Ambulatory Visit: Admitting: Nurse Practitioner

## 2024-08-17 ENCOUNTER — Encounter: Payer: Self-pay | Admitting: Nurse Practitioner

## 2024-08-17 ENCOUNTER — Ambulatory Visit: Payer: Self-pay | Admitting: Nurse Practitioner

## 2024-08-17 VITALS — BP 136/84 | HR 81 | Temp 98.6°F | Ht 66.5 in | Wt 299.0 lb

## 2024-08-17 DIAGNOSIS — E559 Vitamin D deficiency, unspecified: Secondary | ICD-10-CM | POA: Diagnosis not present

## 2024-08-17 DIAGNOSIS — I10 Essential (primary) hypertension: Secondary | ICD-10-CM

## 2024-08-17 DIAGNOSIS — R7303 Prediabetes: Secondary | ICD-10-CM

## 2024-08-17 DIAGNOSIS — F331 Major depressive disorder, recurrent, moderate: Secondary | ICD-10-CM

## 2024-08-17 DIAGNOSIS — Z0001 Encounter for general adult medical examination with abnormal findings: Secondary | ICD-10-CM

## 2024-08-17 DIAGNOSIS — N951 Menopausal and female climacteric states: Secondary | ICD-10-CM | POA: Insufficient documentation

## 2024-08-17 DIAGNOSIS — R899 Unspecified abnormal finding in specimens from other organs, systems and tissues: Secondary | ICD-10-CM | POA: Insufficient documentation

## 2024-08-17 LAB — BAYER DCA HB A1C WAIVED: HB A1C (BAYER DCA - WAIVED): 5.7 % — ABNORMAL HIGH (ref 4.8–5.6)

## 2024-08-17 MED ORDER — LISINOPRIL 10 MG PO TABS: 10.0000 mg | ORAL_TABLET | Freq: Every day | ORAL | 0 refills | Status: AC

## 2024-08-17 MED ORDER — NAPROXEN 500 MG PO TABS: 500.0000 mg | ORAL_TABLET | Freq: Two times a day (BID) | ORAL | 0 refills | Status: AC | PRN

## 2024-08-17 MED ORDER — BUPROPION HCL ER (XL) 150 MG PO TB24: 150.0000 mg | ORAL_TABLET | Freq: Every day | ORAL | 1 refills | Status: AC

## 2024-08-17 MED ORDER — CETIRIZINE HCL 5 MG PO TABS: 5.0000 mg | ORAL_TABLET | Freq: Every day | ORAL | 0 refills | Status: AC

## 2024-08-17 NOTE — Progress Notes (Signed)
 "    Subjective:  Patient ID: Virginia Barrett, female    DOB: Jan 11, 1976, 49 y.o.   MRN: [id number]  Patient Care Team: Deitra Morton Sebastian Nena, NP as PCP - General (Nurse Practitioner)   Chief Complaint:  Medical Management of Chronic Issues   HPI: Virginia Barrett is a 49 y.o. female presenting on 08/17/2024 for Medical Management of Chronic Issues   Discussed the use of AI scribe software for clinical note transcription with the patient, who gave verbal consent to proceed.  History of Present Illness [name] is a 49 year old female who presents for follow-up on her current medications and weight management.  She is currently taking Wellbutrin  for depression, which she finds effective in improving focus and reducing stress levels. During the holidays, she occasionally increased the dose to two pills a day but did not notice a significant change, attributing this to holiday stress. She continues to experience some irritability, sleep disturbances, and altered taste and smell, which she attributes to her age and previous COVID-19 infection.  She is concerned about potential perimenopausal symptoms, including 'irrationally hot' sensations and sleep disturbances.  She has a history of obesity and is actively working on weight loss. She reports a weight of 299 pounds, maintaining this weight since November despite the holidays. She is focusing on diet and exercise, having recently ordered an exercise bike to increase her physical activity. She wants to lose weight to alleviate knee pain and improve her overall health.  She is currently taking metformin  for weight management but does not take it consistently due to gastrointestinal discomfort when taken on an empty stomach. She takes it once a day with food to avoid these side effects.  She is on lisinopril  for hypertension and reports taking it daily without needing additional doses.   She is on atenolol  for anxiety and has  not needed it in the last few months.  She d carries a relief bottle but has not needed to use it.  She is taking a high-dose vitamin D  supplement once a week due to previously low levels, which were at 16. She is awaiting lab results to determine if she needs to continue this regimen.  She occasionally takes naproxen  for pain management, particularly for knee pain, and has noted some improvement after changing her footwear to less cushioned shoes.    Relevant past medical, surgical, family, and social history reviewed and updated as indicated.  Allergies and medications reviewed and updated. Data reviewed: Chart in Epic.   Past Medical History:  Diagnosis Date   Acute frontal sinusitis, unspecified    Bronchitis, not specified as acute or chronic    Constipation    Diarrhea    Disappearance and death of family member    Diverticulitis    Diverticulitis of small intestine without perforation or abscess without bleeding    Dizziness and giddiness    Headache(784.0)    occasional    Insomnia, unspecified    Lumbago    Obesity, unspecified    Pain in right knee    Pneumonia    hx of walking pneumonia    Precancerous skin lesion    Sinus infection    diagnosed on 01/28/13     Past Surgical History:  Procedure Laterality Date   BIOPSY  08/01/2020   Procedure: BIOPSY;  Surgeon: Golda Claudis PENNER, MD;  Location: [facility];  Service: Endoscopy;;  rectal polyps times 2   COLONOSCOPY N/A 11/17/2012  Procedure: COLONOSCOPY;  Surgeon: Claudis RAYMOND Rivet, MD;  Location: [facility];  Service: Endoscopy;  Laterality: N/A;  1200-moved to1030 Ann to notify pt   COLONOSCOPY WITH PROPOFOL  N/A 08/01/2020   Procedure: COLONOSCOPY WITH PROPOFOL ;  Surgeon: Rivet Claudis RAYMOND, MD;  Location: [facility];  Service: Endoscopy;  Laterality: N/A;  8:30   LAPAROSCOPIC SIGMOID COLECTOMY N/A 02/03/2013   Procedure: LAPAROSCOPIC SIGMOID COLECTOMY;  Surgeon: Camellia CHRISTELLA Blush, MD;  Location: [facility];  Service:  General;  Laterality: N/A;   MOUTH SURGERY     wisdom teeth removed   PROCTOSCOPY  02/03/2013   Procedure: PROCTOSCOPY;  Surgeon: Camellia CHRISTELLA Blush, MD;  Location: [facility];  Service: General;;    Social History   Socioeconomic History   Marital status: Significant Other    Spouse name: Not on file   Number of children: Not on file   Years of education: Not on file   Highest education level: Bachelor's degree (e.g., BA, AB, BS)  Occupational History   Not on file  Tobacco Use   Smoking status: Never   Smokeless tobacco: Never  Vaping Use   Vaping status: Never Used  Substance and Sexual Activity   Alcohol use: Yes    Comment: rarely   Drug use: No   Sexual activity: Never    Birth control/protection: None  Other Topics Concern   Not on file  Social History Narrative   Not on file   Social Drivers of Health   Tobacco Use: Low Risk (08/17/2024)   Patient History    Smoking Tobacco Use: Never    Smokeless Tobacco Use: Never    Passive Exposure: Not on file  Financial Resource Strain: Low Risk (06/07/2024)   Overall Financial Resource Strain (CARDIA)    Difficulty of Paying Living Expenses: Not hard at all  Food Insecurity: No Food Insecurity (06/07/2024)   Epic    Worried About Programme Researcher, Broadcasting/film/video in the Last Year: Never true    Ran Out of Food in the Last Year: Never true  Transportation Needs: No Transportation Needs (06/07/2024)   Epic    Lack of Transportation (Medical): No    Lack of Transportation (Non-Medical): No  Physical Activity: Insufficiently Active (06/07/2024)   Exercise Vital Sign    Days of Exercise per Week: 1 day    Minutes of Exercise per Session: 20 min  Stress: No Stress Concern Present (06/07/2024)   [employer] of Occupational [employer] - Occupational Stress Questionnaire    Feeling of Stress: Only a little  Recent Concern: Stress - Stress Concern Present (04/25/2024)   [employer] of Occupational [employer] - Occupational Stress  Questionnaire    Feeling of Stress: Rather much  Social Connections: Socially Isolated (06/07/2024)   Social Connection and Isolation Panel    Frequency of Communication with Friends and Family: Three times a week    Frequency of Social Gatherings with Friends and Family: Once a week    Attends Religious Services: Never    Database Administrator or Organizations: No    Attends Engineer, Structural: Not on file    Marital Status: Never married  Intimate Partner Violence: Not At Risk (08/18/2023)   Humiliation, Afraid, Rape, and Kick questionnaire    Fear of Current or Ex-Partner: No    Emotionally Abused: No    Physically Abused: No    Sexually Abused: No  Depression (PHQ2-9): Low Risk (08/17/2024)   Depression (PHQ2-9)    PHQ-2 Score:  4  Recent Concern: Depression (PHQ2-9) - Medium Risk (06/07/2024)   Depression (PHQ2-9)    PHQ-2 Score: 6  Alcohol Screen: Low Risk (06/07/2024)   Alcohol Screen    Last Alcohol Screening Score (AUDIT): 3  Housing: Unknown (06/07/2024)   Epic    Unable to Pay for Housing in the Last Year: No    Number of Times Moved in the Last Year: Not on file    Homeless in the Last Year: No  Utilities: Not At Risk (08/18/2023)   [facility] Utilities    Threatened with loss of utilities: No  Health Literacy: Not on file    Outpatient Encounter Medications as of 08/17/2024  Medication Sig   atenolol  (TENORMIN ) 25 MG tablet Take 1 tablet (25 mg total) by mouth 2 (two) times daily.   EPINEPHrine  0.3 mg/0.3 mL IJ SOAJ injection Inject 0.3 mg into the muscle as needed for anaphylaxis.   metFORMIN  (GLUCOPHAGE ) 500 MG tablet Take 1 tablet (500 mg total) by mouth 2 (two) times daily with a meal.   naproxen  (NAPROSYN ) 500 MG tablet Take 1 tablet (500 mg total) by mouth 2 (two) times daily as needed.   Vitamin D , Ergocalciferol , (DRISDOL ) 1.25 MG (50000 UNIT) CAPS capsule Take 1 capsule (50,000 Units total) by mouth every 7 (seven) days.   [DISCONTINUED] buPROPion   (WELLBUTRIN  XL) 150 MG 24 hr tablet Take 1 tablet (150 mg total) by mouth daily.   [DISCONTINUED] cetirizine  (ZYRTEC ) 5 MG tablet Take 1 tablet (5 mg total) by mouth daily.   [DISCONTINUED] lisinopril  (ZESTRIL ) 10 MG tablet Take 1 tablet (10 mg total) by mouth daily.   [DISCONTINUED] methocarbamol  (ROBAXIN ) 500 MG tablet Take 1 tablet (500 mg total) by mouth 2 (two) times daily.   buPROPion  (WELLBUTRIN  XL) 150 MG 24 hr tablet Take 1 tablet (150 mg total) by mouth daily.   cetirizine  (ZYRTEC ) 5 MG tablet Take 1 tablet (5 mg total) by mouth daily.   lisinopril  (ZESTRIL ) 10 MG tablet Take 1 tablet (10 mg total) by mouth daily.   [DISCONTINUED] diphenhydrAMINE  (BENADRYL ) 25 MG tablet Take 1 tablet (25 mg total) by mouth every 6 (six) hours.   No facility-administered encounter medications on file as of 08/17/2024.    Allergies[1]  Pertinent ROS per HPI, otherwise unremarkable      Objective:  BP 136/84   Pulse 81   Temp 98.6 F (37 C)   Ht 5' 6.5 (1.689 m)   Wt 299 lb (135.6 kg)   LMP 08/01/2024 (Exact Date)   SpO2 97%   BMI 47.54 kg/m    Wt Readings from Last 3 Encounters:  08/17/24 299 lb (135.6 kg)  06/07/24 299 lb 6.4 oz (135.8 kg)  04/26/24 (!) 307 lb (139.3 kg)   BP Readings from Last 3 Encounters:  08/17/24 136/84  06/07/24 126/78  04/26/24 (!) 148/84     Physical Exam Vitals and nursing note reviewed.  Constitutional:      Appearance: She is obese.  HENT:     Head: Normocephalic and atraumatic.     Nose: Nose normal.     Mouth/Throat:     Mouth: Mucous membranes are moist.  Eyes:     General: No scleral icterus.    Extraocular Movements: Extraocular movements intact.     Conjunctiva/sclera: Conjunctivae normal.     Pupils: Pupils are equal, round, and reactive to light.  Neck:     Vascular: No carotid bruit.  Cardiovascular:     Heart sounds: Normal heart  sounds.  Pulmonary:     Effort: Pulmonary effort is normal.     Breath sounds: Normal breath  sounds.  Musculoskeletal:        General: Normal range of motion.     Cervical back: Normal range of motion and neck supple. No rigidity or tenderness.     Right lower leg: No edema.     Left lower leg: No edema.  Lymphadenopathy:     Cervical: No cervical adenopathy.  Skin:    General: Skin is warm and dry.     Findings: No rash.  Neurological:     Mental Status: She is alert and oriented to person, place, and time.  Psychiatric:        Mood and Affect: Mood normal.        Behavior: Behavior normal.        Thought Content: Thought content normal.        Judgment: Judgment normal.    Physical Exam      Results for orders placed or performed in visit on 04/26/24  Bayer DCA Hb A1c Waived   Collection Time: 04/26/24 12:01 PM  Result Value Ref Range   HB A1C (BAYER DCA - WAIVED) 6.0 (H) 4.8 - 5.6 %  Comprehensive metabolic panel with GFR   Collection Time: 04/26/24 12:07 PM  Result Value Ref Range   Glucose 87 70 - 99 mg/dL   BUN 11 6 - 24 mg/dL   Creatinine, Ser 9.40 0.57 - 1.00 mg/dL   eGFR 887 >40 fO/fpw/8.26   BUN/Creatinine Ratio 19 9 - 23   Sodium 139 134 - 144 mmol/L   Potassium 3.6 3.5 - 5.2 mmol/L   Chloride 102 96 - 106 mmol/L   CO2 23 20 - 29 mmol/L   Calcium 8.9 8.7 - 10.2 mg/dL   Total Protein 6.1 6.0 - 8.5 g/dL   Albumin 3.8 (L) 3.9 - 4.9 g/dL   Globulin, Total 2.3 1.5 - 4.5 g/dL   Bilirubin Total 0.4 0.0 - 1.2 mg/dL   Alkaline Phosphatase 54 41 - 116 IU/L   AST 13 0 - 40 IU/L   ALT 17 0 - 32 IU/L  CBC with Differential/Platelet   Collection Time: 04/26/24 12:07 PM  Result Value Ref Range   WBC 11.4 (H) 3.4 - 10.8 x10E3/uL   RBC 4.36 3.77 - 5.28 x10E6/uL   Hemoglobin 12.7 11.1 - 15.9 g/dL   Hematocrit 59.5 65.9 - 46.6 %   MCV 93 79 - 97 fL   MCH 29.1 26.6 - 33.0 pg   MCHC 31.4 (L) 31.5 - 35.7 g/dL   RDW 86.5 88.2 - 84.5 %   Platelets 397 150 - 450 x10E3/uL   Neutrophils 63 Not Estab. %   Lymphs 28 Not Estab. %   Monocytes 7 Not Estab. %   Eos  1 Not Estab. %   Basos 1 Not Estab. %   Neutrophils Absolute 7.2 (H) 1.4 - 7.0 x10E3/uL   Lymphocytes Absolute 3.2 (H) 0.7 - 3.1 x10E3/uL   Monocytes Absolute 0.8 0.1 - 0.9 x10E3/uL   EOS (ABSOLUTE) 0.1 0.0 - 0.4 x10E3/uL   Basophils Absolute 0.1 0.0 - 0.2 x10E3/uL   Immature Granulocytes 0 Not Estab. %   Immature Grans (Abs) 0.0 0.0 - 0.1 x10E3/uL  Lipid panel   Collection Time: 04/26/24 12:07 PM  Result Value Ref Range   Cholesterol, Total 170 100 - 199 mg/dL   Triglycerides 94 0 - 149 mg/dL   HDL 51 >  39 mg/dL   VLDL Cholesterol Cal 17 5 - 40 mg/dL   LDL Chol Calc (NIH) 897 (H) 0 - 99 mg/dL   Chol/HDL Ratio 3.3 0.0 - 4.4 ratio  Thyroid  Panel With TSH   Collection Time: 04/26/24 12:07 PM  Result Value Ref Range   TSH 1.130 0.450 - 4.500 uIU/mL   T4, Total 8.3 4.5 - 12.0 ug/dL   T3 Uptake Ratio 25 24 - 39 %   Free Thyroxine Index 2.1 1.2 - 4.9  VITAMIN D  25 Hydroxy (Vit-D Deficiency, Fractures)   Collection Time: 04/26/24 12:07 PM  Result Value Ref Range   Vit D, 25-Hydroxy 16.6 (L) 30.0 - 100.0 ng/mL       Pertinent labs & imaging results that were available during my care of the patient were reviewed by me and considered in my medical decision making.  Assessment & Plan:  Kamori Kitchens was seen today for medical management of chronic issues.  Diagnoses and all orders for this visit:  Vitamin D  deficiency -     VITAMIN D  25 Hydroxy (Vit-D Deficiency, Fractures)  Morbid obesity (HCC) -     Bayer DCA Hb A1c Waived -     Lipid panel -     Cancel: Thyroid  Panel With TSH  Hot flashes due to menopause -     TSH+FSH+TestT+LH+T3+DHEA-S+...  Abnormal laboratory test result  Prediabetes -     Bayer DCA Hb A1c Waived -     CMP14+EGFR  Primary hypertension -     Cancel: CBC with Differential/Platelet -     lisinopril  (ZESTRIL ) 10 MG tablet; Take 1 tablet (10 mg total) by mouth daily.  Encounter for general adult medical examination with abnormal findings -      Bayer DCA Hb A1c Waived -     Cancel: CBC with Differential/Platelet -     CMP14+EGFR -     Lipid panel -     VITAMIN D  25 Hydroxy (Vit-D Deficiency, Fractures) -     Cancel: Thyroid  Panel With TSH -     buPROPion  (WELLBUTRIN  XL) 150 MG 24 hr tablet; Take 1 tablet (150 mg total) by mouth daily.  Moderate episode of recurrent major depressive disorder (HCC) -     buPROPion  (WELLBUTRIN  XL) 150 MG 24 hr tablet; Take 1 tablet (150 mg total) by mouth daily.  Other orders -     cetirizine  (ZYRTEC ) 5 MG tablet; Take 1 tablet (5 mg total) by mouth daily. -     naproxen  (NAPROSYN ) 500 MG tablet; Take 1 tablet (500 mg total) by mouth 2 (two) times daily as needed.     Assessment and Plan Elishia is a 49 year old Caucasian female seen today for chronic disease management, no acute distress Assessment & Plan Class 3 severe obesity BMI 47.54, weight stable at 299 lbs since November. Prefers lifestyle changes over medication. - Continue diet and exercise for 3 months, aim for 6-10 lbs weight loss. - Consider GLP-1 agonist if weight loss goal unmet. - Check insurance for GLP-1 agonists, explore coupons if needed.  Menopausal symptoms Symptoms suggest perimenopause. Prefers non-prescription options due to medication side effect concerns. - Recommended evening primrose oil. - Ordered female hormone levels.  Prediabetes Metformin  prescribed, but patient only takes it with food due to GI side effects. - Adjusted metformin  to once daily with supper.  Primary hypertension Managed with lisinopril  and atenolol . No additional atenolol  needed since starting Wellbutrin . - Continue lisinopril  10 mg daily. - Continue atenolol  25 mg  daily as needed.  Moderate episode of recurrent major depressive disorder Managed with Wellbutrin . Reports improved mood and focus. - Continue Wellbutrin  150 mg daily.  Vitamin D  deficiency Previous level 16 ng/mL. On high-dose vitamin D . - Rechecked vitamin D  levels. -  Continue high-dose vitamin D  supplementation.  Chronic right knee pain Uses naproxen  as needed. Reports improvement with footwear changes. - Continue naproxen  as needed. - Encouraged appropriate footwear use.  General health maintenance Routine lab work and chronic condition monitoring. - CBC, CMP, lipid,TSH+FSH+TestT+LH+T3+DHEA-S+... - Scheduled follow-up in 3 months.   Future Weight goal 6 to 10 pound weight in 3 months; continue metformin  diet and exercise.  May consider GLP-1     Continue all other maintenance medications.  Follow up plan: Return in about 3 months (around 11/15/2024).   Continue healthy lifestyle choices, including diet (rich in fruits, vegetables, and lean proteins, and low in salt and simple carbohydrates) and exercise (at least 30 minutes of moderate physical activity daily).  Educational handout given for   Clinical References  Health Maintenance, Female Adopting a healthy lifestyle and getting preventive care are important in promoting health and wellness. Ask your health care provider about: The right schedule for you to have regular tests and exams. Things you can do on your own to prevent diseases and keep yourself healthy. What should I know about diet, weight, and exercise? Eat a healthy diet  Eat a diet that includes plenty of vegetables, fruits, low-fat dairy products, and lean protein. Do not eat a lot of foods that are high in solid fats, added sugars, or sodium. Maintain a healthy weight Body mass index (BMI) is used to identify weight problems. It estimates body fat based on height and weight. Your health care provider can help determine your BMI and help you achieve or maintain a healthy weight. Get regular exercise Get regular exercise. This is one of the most important things you can do for your health. Most adults should: Exercise for at least 150 minutes each week. The exercise should increase your heart rate and make you sweat  (moderate-intensity exercise). Do strengthening exercises at least twice a week. This is in addition to the moderate-intensity exercise. Spend less time sitting. Even light physical activity can be beneficial. Watch cholesterol and blood lipids Have your blood tested for lipids and cholesterol at 48 years of age, then have this test every 5 years. Have your cholesterol levels checked more often if: Your lipid or cholesterol levels are high. You are older than 48 years of age. You are at high risk for heart disease. What should I know about cancer screening? Depending on your health history and family history, you may need to have cancer screening at various ages. This may include screening for: Breast cancer. Cervical cancer. Colorectal cancer. Skin cancer. Lung cancer. What should I know about heart disease, diabetes, and high blood pressure? Blood pressure and heart disease High blood pressure causes heart disease and increases the risk of stroke. This is more likely to develop in people who have high blood pressure readings or are overweight. Have your blood pressure checked: Every 3-5 years if you are 104-104 years of age. Every year if you are 51 years old or older. Diabetes Have regular diabetes screenings. This checks your fasting blood sugar level. Have the screening done: Once every three years after age 42 if you are at a normal weight and have a low risk for diabetes. More often and at a younger age if you  are overweight or have a high risk for diabetes. What should I know about preventing infection? Hepatitis B If you have a higher risk for hepatitis B, you should be screened for this virus. Talk with your health care provider to find out if you are at risk for hepatitis B infection. Hepatitis C Testing is recommended for: Everyone born from [birth year] through 1964. Anyone with known risk factors for hepatitis C. Sexually transmitted infections (STIs) Get screened for STIs,  including gonorrhea and chlamydia, if: You are sexually active and are younger than 48 years of age. You are older than 48 years of age and your health care provider tells you that you are at risk for this type of infection. Your sexual activity has changed since you were last screened, and you are at increased risk for chlamydia or gonorrhea. Ask your health care provider if you are at risk. Ask your health care provider about whether you are at high risk for HIV. Your health care provider may recommend a prescription medicine to help prevent HIV infection. If you choose to take medicine to prevent HIV, you should first get tested for HIV. You should then be tested every 3 months for as long as you are taking the medicine. Pregnancy If you are about to stop having your period (premenopausal) and you may become pregnant, seek counseling before you get pregnant. Take 400 to 800 micrograms (mcg) of folic acid every day if you become pregnant. Ask for birth control (contraception) if you want to prevent pregnancy. Osteoporosis and menopause Osteoporosis is a disease in which the bones lose minerals and strength with aging. This can result in bone fractures. If you are 32 years old or older, or if you are at risk for osteoporosis and fractures, ask your health care provider if you should: Be screened for bone loss. Take a calcium or vitamin D  supplement to lower your risk of fractures. Be given hormone replacement therapy (HRT) to treat symptoms of menopause. Follow these instructions at home: Alcohol use Do not drink alcohol if: Your health care provider tells you not to drink. You are pregnant, may be pregnant, or are planning to become pregnant. If you drink alcohol: Limit how much you have to: 0-1 drink a day. Know how much alcohol is in your drink. In the [place] one drink equals one 12 oz bottle of beer (355 mL), one 5 oz glass of wine (148 mL), or one 1 oz glass of hard liquor (44  mL). Lifestyle Do not use any products that contain nicotine or tobacco. These products include cigarettes, chewing tobacco, and vaping devices, such as e-cigarettes. If you need help quitting, ask your health care provider. Do not use street drugs. Do not share needles. Ask your health care provider for help if you need support or information about quitting drugs. General instructions Schedule regular health, dental, and eye exams. Stay current with your vaccines. Tell your health care provider if: You often feel depressed. You have ever been abused or do not feel safe at home. Summary Adopting a healthy lifestyle and getting preventive care are important in promoting health and wellness. Follow your health care provider's instructions about healthy diet, exercising, and getting tested or screened for diseases. Follow your health care provider's instructions on monitoring your cholesterol and blood pressure. This information is not intended to replace advice given to you by your health care provider. Make sure you discuss any questions you have with your health care provider. Document Revised: 12/03/2020  Document Reviewed: 12/03/2020 Elsevier Patient Education  2024 [employer] Menopause: What to Know Menopause is the time in your life when your menstrual periods stop. It marks the end of your ability to get pregnant. It can be defined as not having a period for 12 months without another medical cause. The time when you start to move into menopause is called perimenopause. It often happens between ages 67-55. It can last for many years. During perimenopause, hormone levels change in your body. This can cause symptoms and affect your health. Menopause may make you more likely to have: Bones that are weak and break more easily. Depression. This is when you feel sad or hopeless. Arteries that harden and get narrow. These can cause heart attacks and strokes. What are the causes? In most  cases, menopause is a natural change to your body and hormone levels that happens as you get older. But in some cases, it may be caused by changes that aren't natural. These include: Surgery to take out both ovaries. Side effects from some medicines. What increases the risk? You're more likely to go through menopause early if: You have an abnormal growth (tumor) of the pituitary gland in your brain. You have a disease that affects your ovaries. You've had certain treatments for cancer. These include: Chemotherapy. Hormone therapy. Radiation therapy on the area between your hips (pelvis). You smoke a lot or drink a lot of alcohol. Other people in your family have gone through menopause early. You're very thin. What are the signs or symptoms? You may have: Hot flashes. Irregular periods. Night sweats. Changes in how you feel about sex. You may: Have less of a sex drive. Feel more discomfort around your sexuality. Vaginal dryness and thinning of the vaginal walls. This may make it hurt to have sex. Skin changes, such as: Dry skin. New wrinkles. Headaches. Other symptoms may include: Trouble sleeping. Mood swings. Memory problems. Weight gain. Hair growth on your face and chest. Bladder infections or trouble peeing. How is this diagnosed? You may be diagnosed based on: Your medical history. An exam. Your age. Your history of menstrual periods. Your symptoms. Hormone tests. How is this treated? In some cases, no treatment is needed. Talk with your health care provider about if you should get treated. Treatments may include: Menopausal hormone therapy (MHT). Medicines to treat certain symptoms. Acupuncture. Vitamin or herbal supplements. Before you start treatment, let your provider know if you or anyone in your family has or has had: Heart disease. Breast cancer. Blood clots. Diabetes. Osteoporosis. Follow these instructions at home: Eating and drinking  Eat a  balanced diet. It should include: Fresh fruits and vegetables. Whole grains. Lean protein. Low-fat dairy. Eat lots of foods that have calcium and vitamin D  in them. These can help keep your bones healthy. Foods and drinks that are rich in calcium include: Yogurt and low-fat milk. Beans. Almonds. Sardines. Broccoli and kale. To help prevent hot flashes, stay away from: Alcohol. Drinks with caffeine in them. Spicy foods. Lifestyle Do not smoke, vape, or use nicotine or tobacco. Get 7-8 hours of sleep each night. If you have hot flashes, you may want to: Dress in layers. Avoid things that may trigger hot flashes, like warm places or stress. Take slow, deep breaths when a hot flash starts. Keep a fan in your home and office. Find ways to manage stress. You may want to try: Deep breathing. Meditation. Writing in a journal. Ask your provider about going to group therapy.  Therapy can help you get support from others who are going through menopause. General instructions  Talk with your provider before you take any herbal supplements. Keep track of your symptoms. Track: When they start. How often you have them. How long they last. Use vaginal lubricants or moisturizers. These can help with: Vaginal dryness. Comfort during sex. Contact a health care provider if: You're older than 55 and still get periods. You have pain during sex. You haven't had a period for 12 months and then start to bleed from your vagina. It hurts to pee. You get very bad headaches. Get help right away if: You're very depressed. You have a lot of bleeding from your vagina. Your heart is beating too fast. You have very bad belly pain or indigestion that doesn't go away with medicines. This information is not intended to replace advice given to you by your health care provider. Make sure you discuss any questions you have with your health care provider. Document Revised: 03/19/2023 Document Reviewed:  03/19/2023 Elsevier Patient Education  2024 Else[employer] Inc. Managing Depression, Adult Depression is a mental health condition that affects your thoughts, feelings, and actions. Being diagnosed with depression can bring you relief if you did not know why you have felt or behaved a certain way. It could also leave you feeling overwhelmed. Finding ways to manage your symptoms can help you feel more positive about your future. How to manage lifestyle changes Being depressed is difficult. Depression can increase the level of everyday stress. Stress can make depression symptoms worse. You may believe your symptoms cannot be managed or will never improve. However, there are many things you can try to help manage your symptoms. There is hope. Managing stress  Stress is your body's reaction to life changes and events, both good and bad. Stress can add to your feelings of depression. Learning to manage your stress can help lessen your feelings of depression. Try some of the following approaches to reducing your stress (stress reduction techniques): Listen to music that you enjoy and that inspires you. Try using a meditation app or take a meditation class. Develop a practice that helps you connect with your spiritual self. Walk in nature, pray, or go to a place of worship. Practice deep breathing. To do this, inhale slowly through your nose. Pause at the top of your inhale for a few seconds and then exhale slowly, letting yourself relax. Repeat this three or four times. Practice yoga to help relax and work your muscles. Choose a stress reduction technique that works for you. These techniques take time and practice to develop. Set aside 5-15 minutes a day to do them. Therapists can offer training in these techniques. Do these things to help manage stress: Keep a journal. Know your limits. Set healthy boundaries for yourself and others, such as saying no when you think something is too much. Pay attention to  how you react to certain situations. You may not be able to control everything, but you can change your reaction. Add humor to your life by watching funny movies or shows. Make time for activities that you enjoy and that relax you. Spend less time using electronics, especially at night before bed. The light from screens can make your brain think it is time to get up rather than go to bed.   Medicines Medicines, such as antidepressants, are often a part of treatment for depression. Talk with your pharmacist or health care provider about all the medicines, supplements, and herbal products  that you take, their possible side effects, and what medicines and other products are safe to take together. Make sure to report any side effects you may have to your health care provider. Relationships Your health care provider may suggest family therapy, couples therapy, or individual therapy as part of your treatment. How to recognize changes Everyone responds differently to treatment for depression. As you recover from depression, you may start to: Have more interest in doing activities. Feel more hopeful. Have more energy. Eat a more regular amount of food. Have better mental focus. It is important to recognize if your depression is not getting better or is getting worse. The symptoms you had in the beginning may return, such as: Feeling tired. Eating too much or too little. Sleeping too much or too little. Feeling restless, agitated, or hopeless. Trouble focusing or making decisions. Having unexplained aches and pains. Feeling irritable, angry, or aggressive. If you or your family members notice these symptoms coming back, let your health care provider know right away. Follow these instructions at home: Activity Try to get some form of exercise each day, such as walking. Try yoga, mindfulness, or other stress reduction techniques. Participate in group activities if you are able. Lifestyle Get  enough sleep. Cut down on or stop using caffeine, tobacco, alcohol, and any other harmful substances. Eat a healthy diet that includes plenty of vegetables, fruits, whole grains, low-fat dairy products, and lean protein. Limit foods that are high in solid fats, added sugar, or salt (sodium). General instructions Take over-the-counter and prescription medicines only as told by your health care provider. Keep all follow-up visits. It is important for your health care provider to check on your mood, behavior, and medicines. Your health care provider may need to make changes to your treatment. Where to find support Talking to others  Friends and family members can be sources of support and guidance. Talk to trusted friends or family members about your condition. Explain your symptoms and let them know that you are working with a health care provider to treat your depression. Tell friends and family how they can help. Finances Find mental health providers that fit with your financial situation. Talk with your health care provider if you are worried about access to food, housing, or medicine. Call your insurance company to learn about your co-pays and prescription plan. Where to find more information You can find support in your area from: Anxiety and Depression Association of America (ADAA): adaa.org Mental Health America: mentalhealthamerica.net [employer] on Mental Illness: nami.org Contact a health care provider if: You stop taking your antidepressant medicines, and you have any of these symptoms: Nausea. Headache. Light-headedness. Chills and body aches. Not being able to sleep (insomnia). You or your friends and family think your depression is getting worse. Get help right away if: You have thoughts of hurting yourself or others. Get help right away if you feel like you may hurt yourself or others, or have thoughts about taking your own life. Go to your nearest emergency room  or: Call 911. Call the National Suicide Prevention Lifeline at 669 299 8899 or 988. This is open 24 hours a day. Text the Crisis Text Line at (930) 134-6193. This information is not intended to replace advice given to you by your health care provider. Make sure you discuss any questions you have with your health care provider. Document Revised: 11/19/2021 Document Reviewed: 11/19/2021 Elsevier Patient Education  2024 Elsevier Inc. BMI for Adults Body mass index (BMI) is a number found  using a person's weight and height. BMI can help tell how much of a person's weight is made up of fat. BMI does not measure body fat directly. It is used instead of tests that directly measure body fat, which can be difficult and expensive. What are BMI measurements used for? BMI is useful to: Find out if your weight puts you at higher risk for medical problems. Help recommend changes, such as in diet and exercise. This can help you reach a healthy weight. BMI screening can be done again to see if these changes are working. How is BMI calculated? Your height and weight are measured. The BMI is found from those numbers. This can be done with [place] or metric measurements. Note that charts and online BMI calculators are available to help you find your BMI quickly and easily without doing these calculations. To calculate your BMI in [place] measurements: Measure your weight in pounds (lb). Multiply the number of pounds by 703. So, for an adult who weighs 150 lb, multiply that number by 703: 150 x 703, which equals 105,450. Measure your height in inches. Then multiply that number by itself to get a measurement called inches squared. So, for an adult who is 70 inches tall, the inches squared measurement is 70 inches x 70 inches, which equals 4,900 inches squared. Divide the total from step 2 (number of lb x 703) by the total from step 3 (inches squared): 105,450  4,900 = 21.5. This is your BMI. To calculate your BMI in  metric measurements:  Measure your weight in kilograms (kg). For this example, the weight is 70 kg. Measure your height in meters (m). Then multiply that number by itself to get a measurement called meters squared. So, for an adult who is 1.75 m tall, the meters squared measurement is 1.75 m x 1.75 m, which equals 3.1 meters squared. Divide the number of kilograms (your weight) by the meters squared number. In this example: 70  3.1 = 22.6. This is your BMI. What do the results mean? BMI charts are used to see if you are underweight, normal weight, overweight, or obese. The following guidelines will be used: Underweight: BMI less than 18.5. Normal weight: BMI between 18.5 and 24.9. Overweight: BMI between 25 and 29.9. Obese: BMI of 30 or above. BMI is a tool and cannot diagnose a condition. Talk with your health care provider about what your BMI means for you. Keep these notes in mind: Weight includes fat and muscle. Someone with a muscular build, such as an athlete, may have a BMI that is higher than 24.9. In cases like these, BMI is not a correct measure of body fat. If you have a BMI of 25 or higher, your provider may need to do more testing to find out if excess body fat is the cause. BMI is measured the same way for males and females. Females usually have more body fat than males of the same height and weight. Where to find more information For more information about BMI, including tools to quickly find your BMI, go to: Centers for Disease Control and Prevention: tonerpromos.no American Heart Association: heart.org National Heart, Lung, and Blood Institute: buffalodrycleaner.gl This information is not intended to replace advice given to you by your health care provider. Make sure you discuss any questions you have with your health care provider. Document Revised: 04/03/2022 Document Reviewed: 03/27/2022 Elsevier Patient Education  2024 Elsevier Inc. Obesity, Adult Obesity is the condition of  having too much total  body fat. Being overweight or obese means that your weight is greater than what is considered healthy for your body size. Obesity is determined by a measurement called BMI (body mass index). BMI is an estimate of body fat and is calculated from height and weight. For adults, a BMI of 30 or higher is considered obese. Obesity can lead to other health concerns and major illnesses, including: Stroke. Coronary artery disease (CAD). Type 2 diabetes. Some types of cancer, including cancers of the colon, breast, uterus, and gallbladder. High blood pressure (hypertension). High cholesterol. Gallbladder stones. Obesity can also contribute to: Osteoarthritis. Sleep apnea. Infertility problems. What are the causes? Common causes of this condition include: Eating daily meals that are high in calories, sugar, and fat. Drinking high amounts of sugar-sweetened beverages, such as soft drinks. Being born with genes that may make you more likely to become obese. Having a medical condition that causes obesity, including: Hypothyroidism. Polycystic ovarian syndrome (PCOS). Binge-eating disorder. Cushing syndrome. Taking certain medicines, such as steroids, antidepressants, and seizure medicines. Not being physically active (sedentary lifestyle). Not getting enough sleep. What increases the risk? The following factors may make you more likely to develop this condition: Having a family history of obesity. Living in an area with limited access to: [place], recreation centers, or sidewalks. Healthy food choices, such as grocery stores and farmers' markets. What are the signs or symptoms? The main sign of this condition is having too much body fat. How is this diagnosed? This condition is diagnosed based on: Your BMI. If you are an adult with a BMI of 30 or higher, you are considered obese. Your waist circumference. This measures the distance around your waistline. Your skinfold  thickness. Your health care provider may gently pinch a fold of your skin and measure it. You may have other tests to check for underlying conditions. How is this treated? Treatment for this condition often includes changing your lifestyle. Treatment may include some or all of the following: Dietary changes. This may include developing a healthy meal plan. Regular physical activity. This may include activity that causes your heart to beat faster (aerobic exercise) and strength training. Work with your health care provider to design an exercise program that works for you. Medicine to help you lose weight if you are unable to lose one pound a week after six weeks of healthy eating and more physical activity. Treating conditions that cause the obesity (underlying conditions). Surgery. Surgical options may include gastric banding and gastric bypass. Surgery may be done if: Other treatments have not helped to improve your condition. You have a BMI of 40 or higher. You have life-threatening health problems related to obesity. Follow these instructions at home: Eating and drinking  Follow recommendations from your health care provider about what you eat and drink. Your health care provider may advise you to: Limit fast food, sweets, and processed snack foods. Choose low-fat options, such as low-fat milk instead of whole milk. Eat five or more servings of fruits or vegetables every day. Choose healthy foods when you eat out. Keep low-fat snacks available. Limit sugary drinks, such as soda, fruit juice, sweetened iced tea, and flavored milk. Drink enough water  to keep your urine pale yellow. Do not follow a fad diet. Fad diets can be unhealthy and even dangerous. Other healthful choices include: Eat at home more often. This gives you more control over what you eat. Learn to read food labels. This will help you understand how much food is considered one  serving. Learn what a healthy serving size  is. Physical activity Exercise regularly, as told by your health care provider. Most adults should get up to 150 minutes of moderate-intensity exercise every week. Ask your health care provider what types of exercise are safe for you and how often you should exercise. Warm up and stretch before being active. Cool down and stretch after being active. Rest between periods of activity. Lifestyle Work with your health care provider and a dietitian to set a weight-loss goal that is healthy and reasonable for you. Limit your screen time. Find ways to reward yourself that do not involve food. Do not drink alcohol if: Your health care provider tells you not to drink. You are pregnant, may be pregnant, or are planning to become pregnant. If you drink alcohol: Limit how much you have to: 0-1 drink a day for women. 0-2 drinks a day for men. Know how much alcohol is in your drink. In the [place] one drink equals one 12 oz bottle of beer (355 mL), one 5 oz glass of wine (148 mL), or one 1 oz glass of hard liquor (44 mL). General instructions Keep a weight-loss journal to keep track of the food you eat and how much exercise you get. Take over-the-counter and prescription medicines only as told by your health care provider. Take vitamins and supplements only as told by your health care provider. Consider joining a support group. Your health care provider may be able to recommend a support group. Pay attention to your mental health as obesity can lead to depression or self esteem issues. Keep all follow-up visits. This is important. Contact a health care provider if: You are unable to meet your weight-loss goal after six weeks of dietary and lifestyle changes. You have trouble breathing. Summary Obesity is the condition of having too much total body fat. Being overweight or obese means that your weight is greater than what is considered healthy for your body size. Work with your health care provider  and a dietitian to set a weight-loss goal that is healthy and reasonable for you. Exercise regularly, as told by your health care provider. Ask your health care provider what types of exercise are safe for you and how often you should exercise. This information is not intended to replace advice given to you by your health care provider. Make sure you discuss any questions you have with your health care provider. Document Revised: 02/19/2021 Document Reviewed: 02/19/2021 Elsevier Patient Education  2024 Elsevier Inc.  The above assessment and management plan was discussed with the patient. The patient verbalized understanding of and has agreed to the management plan. Patient is aware to call the clinic if they develop any new symptoms or if symptoms persist or worsen. Patient is aware when to return to the clinic for a follow-up visit. Patient educated on when it is appropriate to go to the emergency department.   Nena Cassis Morton Hummer, DNP [facility] [address] [place], [place] [address] [phone]       [1]  Allergies Allergen Reactions   Cinnamon Anaphylaxis   Elemental Sulfur Hives   Codeine Hives and Swelling   Penicillins Hives   Red Dye #40 (Allura Red) Hives   Ciprofloxacin  Rash   "

## 2024-08-17 NOTE — Patient Instructions (Signed)
 premarin evening oil

## 2024-08-18 ENCOUNTER — Ambulatory Visit: Admitting: Nurse Practitioner

## 2024-08-18 LAB — CMP14+EGFR
ALT: 15 IU/L (ref 0–32)
AST: 14 IU/L (ref 0–40)
Albumin: 4.2 g/dL (ref 3.9–4.9)
Alkaline Phosphatase: 57 IU/L (ref 41–116)
BUN/Creatinine Ratio: 12 (ref 9–23)
BUN: 8 mg/dL (ref 6–24)
Bilirubin Total: 0.5 mg/dL (ref 0.0–1.2)
CO2: 23 mmol/L (ref 20–29)
Calcium: 9.3 mg/dL (ref 8.7–10.2)
Chloride: 103 mmol/L (ref 96–106)
Creatinine, Ser: 0.69 mg/dL (ref 0.57–1.00)
Globulin, Total: 2.4 g/dL (ref 1.5–4.5)
Glucose: 97 mg/dL (ref 70–99)
Potassium: 4.8 mmol/L (ref 3.5–5.2)
Sodium: 143 mmol/L (ref 134–144)
Total Protein: 6.6 g/dL (ref 6.0–8.5)
eGFR: 108 mL/min/1.73

## 2024-08-18 LAB — LIPID PANEL
Chol/HDL Ratio: 3.6 ratio (ref 0.0–4.4)
Cholesterol, Total: 189 mg/dL (ref 100–199)
HDL: 53 mg/dL
LDL Chol Calc (NIH): 116 mg/dL — ABNORMAL HIGH (ref 0–99)
Triglycerides: 110 mg/dL (ref 0–149)
VLDL Cholesterol Cal: 20 mg/dL (ref 5–40)

## 2024-08-18 LAB — VITAMIN D 25 HYDROXY (VIT D DEFICIENCY, FRACTURES): Vit D, 25-Hydroxy: 35.6 ng/mL (ref 30.0–100.0)

## 2024-08-19 LAB — TSH+FSH+TESTT+LH+T3+DHEA-S+...
DHEA-SO4: 42.7 ug/dL (ref 41.2–243.7)
Estradiol: 110 pg/mL
FSH: 17.2 m[IU]/mL
LH: 50.3 m[IU]/mL
Progesterone: 0.6 ng/mL
T3, Total: 168 ng/dL (ref 71–180)
TSH: 0.723 u[IU]/mL (ref 0.450–4.500)
Testosterone, Free: 0.3 pg/mL (ref 0.0–4.2)
Testosterone: 10 ng/dL (ref 4–50)

## 2024-08-22 ENCOUNTER — Ambulatory Visit: Payer: Self-pay | Admitting: Nurse Practitioner

## 2024-08-24 ENCOUNTER — Other Ambulatory Visit: Payer: Self-pay | Admitting: Nurse Practitioner

## 2024-09-01 ENCOUNTER — Encounter: Payer: Self-pay | Admitting: *Deleted

## 2024-10-18 ENCOUNTER — Ambulatory Visit: Admitting: Family

## 2024-10-18 ENCOUNTER — Ambulatory Visit: Admitting: Nurse Practitioner
